# Patient Record
Sex: Female | Born: 1945
Health system: Southern US, Community
[De-identification: ages and names within clinical notes are randomized; demographics above are authoritative.]

## PROBLEM LIST (undated history)

## (undated) DIAGNOSIS — E669 Obesity, unspecified: Secondary | ICD-10-CM

## (undated) DIAGNOSIS — J302 Other seasonal allergic rhinitis: Secondary | ICD-10-CM

## (undated) DIAGNOSIS — E739 Lactose intolerance, unspecified: Secondary | ICD-10-CM

## (undated) DIAGNOSIS — L4050 Arthropathic psoriasis, unspecified: Secondary | ICD-10-CM

## (undated) DIAGNOSIS — I10 Essential (primary) hypertension: Secondary | ICD-10-CM

## (undated) DIAGNOSIS — O24419 Gestational diabetes mellitus in pregnancy, unspecified control: Secondary | ICD-10-CM

## (undated) DIAGNOSIS — M8580 Other specified disorders of bone density and structure, unspecified site: Secondary | ICD-10-CM

## (undated) DIAGNOSIS — I8390 Asymptomatic varicose veins of unspecified lower extremity: Secondary | ICD-10-CM

## (undated) DIAGNOSIS — Z87442 Personal history of urinary calculi: Secondary | ICD-10-CM

## (undated) DIAGNOSIS — K5790 Diverticulosis of intestine, part unspecified, without perforation or abscess without bleeding: Secondary | ICD-10-CM

## (undated) DIAGNOSIS — M2550 Pain in unspecified joint: Secondary | ICD-10-CM

## (undated) DIAGNOSIS — Z91018 Allergy to other foods: Secondary | ICD-10-CM

## (undated) DIAGNOSIS — K219 Gastro-esophageal reflux disease without esophagitis: Secondary | ICD-10-CM

## (undated) DIAGNOSIS — E119 Type 2 diabetes mellitus without complications: Secondary | ICD-10-CM

## (undated) DIAGNOSIS — I872 Venous insufficiency (chronic) (peripheral): Secondary | ICD-10-CM

## (undated) DIAGNOSIS — R600 Localized edema: Secondary | ICD-10-CM

## (undated) DIAGNOSIS — N200 Calculus of kidney: Secondary | ICD-10-CM

## (undated) DIAGNOSIS — R7989 Other specified abnormal findings of blood chemistry: Secondary | ICD-10-CM

## (undated) DIAGNOSIS — R928 Other abnormal and inconclusive findings on diagnostic imaging of breast: Secondary | ICD-10-CM

## (undated) DIAGNOSIS — M1990 Unspecified osteoarthritis, unspecified site: Secondary | ICD-10-CM

## (undated) DIAGNOSIS — R011 Cardiac murmur, unspecified: Secondary | ICD-10-CM

## (undated) HISTORY — DX: Type 2 diabetes mellitus without complications: E11.9

## (undated) HISTORY — DX: Cardiac murmur, unspecified: R01.1

## (undated) HISTORY — DX: Diverticulosis of intestine, part unspecified, without perforation or abscess without bleeding: K57.90

## (undated) HISTORY — DX: Asymptomatic varicose veins of unspecified lower extremity: I83.90

## (undated) HISTORY — DX: Other specified abnormal findings of blood chemistry: R79.89

## (undated) HISTORY — DX: Other abnormal and inconclusive findings on diagnostic imaging of breast: R92.8

## (undated) HISTORY — DX: Allergy to other foods: Z91.018

## (undated) HISTORY — DX: Obesity, unspecified: E66.9

## (undated) HISTORY — DX: Gestational diabetes mellitus in pregnancy, unspecified control: O24.419

## (undated) HISTORY — DX: Other specified disorders of bone density and structure, unspecified site: M85.80

## (undated) HISTORY — PX: TONSILLECTOMY AND ADENOIDECTOMY: SHX28

## (undated) HISTORY — DX: Essential (primary) hypertension: I10

## (undated) HISTORY — DX: Localized edema: R60.0

## (undated) HISTORY — PX: TONSILLECTOMY: SUR1361

## (undated) HISTORY — DX: Other seasonal allergic rhinitis: J30.2

## (undated) HISTORY — DX: Venous insufficiency (chronic) (peripheral): I87.2

## (undated) HISTORY — DX: Lactose intolerance, unspecified: E73.9

## (undated) HISTORY — DX: Arthropathic psoriasis, unspecified: L40.50

## (undated) HISTORY — DX: Calculus of kidney: N20.0

## (undated) HISTORY — DX: Pain in unspecified joint: M25.50

---

## 1995-10-28 DIAGNOSIS — N200 Calculus of kidney: Secondary | ICD-10-CM

## 1995-10-28 HISTORY — DX: Calculus of kidney: N20.0

## 2005-12-25 ENCOUNTER — Other Ambulatory Visit: Admission: RE | Admit: 2005-12-25 | Discharge: 2005-12-25 | Payer: Self-pay | Admitting: Family Medicine

## 2006-04-26 DIAGNOSIS — M8580 Other specified disorders of bone density and structure, unspecified site: Secondary | ICD-10-CM

## 2006-04-26 HISTORY — DX: Other specified disorders of bone density and structure, unspecified site: M85.80

## 2006-06-27 DIAGNOSIS — R928 Other abnormal and inconclusive findings on diagnostic imaging of breast: Secondary | ICD-10-CM

## 2006-06-27 HISTORY — PX: BREAST BIOPSY: SHX20

## 2006-06-27 HISTORY — DX: Other abnormal and inconclusive findings on diagnostic imaging of breast: R92.8

## 2006-07-10 ENCOUNTER — Ambulatory Visit: Payer: Self-pay | Admitting: Gastroenterology

## 2007-08-19 ENCOUNTER — Other Ambulatory Visit: Admission: RE | Admit: 2007-08-19 | Discharge: 2007-08-19 | Payer: Self-pay | Admitting: Family Medicine

## 2008-09-26 LAB — HM PAP SMEAR: HM Pap smear: NORMAL

## 2008-10-05 ENCOUNTER — Other Ambulatory Visit: Admission: RE | Admit: 2008-10-05 | Discharge: 2008-10-05 | Payer: Self-pay | Admitting: Family Medicine

## 2009-03-09 LAB — HM MAMMOGRAPHY: HM Mammogram: ABNORMAL

## 2009-03-09 LAB — HM DEXA SCAN

## 2011-03-14 NOTE — Assessment & Plan Note (Signed)
[facility]                           GASTROENTEROLOGY OFFICE NOTE   NAME:COUCHEvann, Koelzer                         MRN:          [id number]  DATE:07/10/2006                            DOB:          06-Dec-1945    Mrs. April Matthews is a very pleasant 65 year old white female referred for possible  screening colonoscopy because of her age.   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  She has really been in excellent  health all of her life except for some essential hypertension without any  complications or history of MIs or CVAs.  She has regular bowel movements  without melena or hematochezia or abdominal pain.  She does have occasional  postprandial acid reflux for which she takes antacids.  She follows a  regular diet and has had no anorexia or weight loss.  She has regular daily  bowel movements without melena or hematochezia.  She relates that in the  past she has not done Hemoccult cards to her knowledge.  She denies any  history of anemia or other chronic medical problems.   On close questioning, she did apparently have kidney stones some 10 years  ago.  She has had a previous tonsillectomy but no abdominal or pelvic  surgery.   FAMILY HISTORY:  Family history remarkable for atherosclerosis with CVAs in  her mother who was elderly but no known gastrointestinal problems.   SOCIAL HISTORY:  She is married and lives with her husband.  She does not  smoke and just uses ethanol very rarely on social occasions.   REVIEW OF SYSTEMS:  Otherwise noncontributory.  She no longer has menstrual  periods.   MEDICATIONS:  1. Atenolol 25 mg a day.  2. Ipratropium daily.  3. Aspirin 81 mg every other day.   EXAM:  GENERAL:  She is a healthy-appearing white female appearing her  stated age, in no acute distress.  VITAL SIGNS:  She is 5 feet 4-1/2 inches tall and weighs 196 pounds.  Blood  pressure 126/92.  Pulse 88 and regular.  I could not appreciate the  stigmata  of chronic liver disease or thyromegaly.  CHEST:  Clear.  There are no murmurs, gallops, or rubs noted.  HEART:  She appears to be in a regular rhythm.  ABDOMEN:  Abdominal exam showed no hepatosplenomegaly, masses, or  tenderness.  Bowel sounds are normal.  RECTAL EXAM:  Deferred.  EXTREMITIES:  Unremarkable.  MENTAL STATUS:  Normal.   ASSESSMENT:  1. Well-controlled essential hypertension.  2. Need for screening colonoscopy because of her age with no specific risk      factors otherwise.  3. History of kidney stones many years ago.   RECOMMENDATIONS:  Outpatient colonoscopy.   Outpatient colonoscopy is scheduled at her convenience.  The risks and  benefits of the procedure and alternative means of examining the colon have  been explained to her at length.  She has agreed to proceed as planned.  We  will hold her aspirin a week before procedure and restart it depending on  colonoscopy results and possible polypectomy.  She is to  continue her other  medications as per Dr. Delford Field excellent care.                                   [name] Rea. Jarold Motto, MD, [facility], [place]   DRP/MedQ  DD:  07/10/2006  DT:  07/11/2006  Job #:  [id number]   cc:   Lavonda Jumbo, M.D.

## 2011-05-15 ENCOUNTER — Encounter: Payer: Self-pay | Admitting: Podiatry

## 2011-05-20 ENCOUNTER — Telehealth: Payer: Self-pay | Admitting: Family Medicine

## 2011-05-20 MED ORDER — ATENOLOL 25 MG PO TABS: 25.0000 mg | ORAL_TABLET | Freq: Every day | ORAL | Status: DC

## 2011-05-20 NOTE — Telephone Encounter (Signed)
Sent in through computer per [employer]

## 2011-05-20 NOTE — Telephone Encounter (Signed)
Renew this medication

## 2011-06-19 ENCOUNTER — Ambulatory Visit (INDEPENDENT_AMBULATORY_CARE_PROVIDER_SITE_OTHER): Payer: BC Managed Care – PPO | Admitting: Family Medicine

## 2011-06-19 ENCOUNTER — Encounter: Payer: Self-pay | Admitting: Family Medicine

## 2011-06-19 VITALS — BP 132/90 | HR 72 | Ht 64.0 in | Wt 226.0 lb

## 2011-06-19 DIAGNOSIS — I10 Essential (primary) hypertension: Secondary | ICD-10-CM

## 2011-06-19 DIAGNOSIS — E559 Vitamin D deficiency, unspecified: Secondary | ICD-10-CM | POA: Insufficient documentation

## 2011-06-19 DIAGNOSIS — Z23 Encounter for immunization: Secondary | ICD-10-CM

## 2011-06-19 DIAGNOSIS — R7301 Impaired fasting glucose: Secondary | ICD-10-CM

## 2011-06-19 LAB — BASIC METABOLIC PANEL WITH GFR
BUN: 21 mg/dL (ref 6–23)
CO2: 30 meq/L (ref 19–32)
Calcium: 9.7 mg/dL (ref 8.4–10.5)
Chloride: 104 meq/L (ref 96–112)
Creat: 1.07 mg/dL (ref 0.50–1.10)
Glucose, Bld: 110 mg/dL — ABNORMAL HIGH (ref 70–99)
Potassium: 4.2 meq/L (ref 3.5–5.3)
Sodium: 142 meq/L (ref 135–145)

## 2011-06-19 LAB — HEMOGLOBIN A1C
Hgb A1c MFr Bld: 6.5 % — ABNORMAL HIGH
Mean Plasma Glucose: 140 mg/dL — ABNORMAL HIGH

## 2011-06-19 MED ORDER — EPINEPHRINE 0.3 MG/0.3ML IJ DEVI: 0.3000 mg | Freq: Once | INTRAMUSCULAR | Status: DC

## 2011-06-19 MED ORDER — ATENOLOL 25 MG PO TABS: 25.0000 mg | ORAL_TABLET | Freq: Every day | ORAL | Status: DC

## 2011-06-19 NOTE — Progress Notes (Signed)
Patient presents to re-establish care, requesting TdaP, and refill of medications, including epi-pen.  She was not seen since she last saw me at [facility] over a year ago. She denies any complaints.  F/u HTN: BP's at [facility] and at home usually run 128-133/72.  Denies headaches, dizziness.  She regained the weight she lost last year through the healthy living challenge.  Chart reviewed with patient--it appears that she never followed up on the abnormal mammogram and ultrasound of L breast--it was recommended that she have a biopsy. She never followed up for biopsy, nor has she had mammogram since.  She has +family history of breast cancer  Past Medical History  Diagnosis Date  . DM (diabetes mellitus), gestational     with first pregnancy (stillborn)--based on autopsy; not diagnosed during pregnancy  . Seasonal allergies   . Heart murmur   . HTN (hypertension)   . Venous insufficiency   . Varicose veins   . Kidney stone 1997  . Abnormal mammogram 9/07    Right breast--bx sclerosing papilloma  . Osteopenia 7/07    mild  . Vitamin D deficiency 12/09  . Abnormal mammogram 02/2009    Left; suspicious abnormality L brast mammo and u/s.  Biopsy recommended but patient never did nor f/u mammo    Past Surgical History  Procedure Date  . Cesarean section 1977  . Tonsillectomy and adenoidectomy   . Breast biopsy 9/07    right--sclerosing papilloma    History   Social History  . Marital Status: Married    Spouse Name: N/A    Number of Children: N/A  . Years of Education: N/A   Occupational History  . reading specialist, works part-time    Social History Main Topics  . Smoking status: Never Smoker   . Smokeless tobacco: Never Used  . Alcohol Use: Yes     occasional wine  . Drug Use: No  . Sexually Active:    Other Topics Concern  . Not on file   Social History Narrative   Lives with husband and 2 dogs    Family History  Problem Relation Age of Onset  . Stroke Mother    . Hypertension Mother   . GER disease Father   . Heart disease Brother   . Cancer Maternal Aunt     breast cancer late 24's  . Cancer Maternal Grandmother     breast cancer 60's  . Cancer Brother     prostate    Current outpatient prescriptions:atenolol (TENORMIN) 25 MG tablet, Take 1 tablet (25 mg total) by mouth daily., Disp: 90 tablet, Rfl: 3;  Calcium Citrate-Vitamin D (CALCIUM CITRATE + D PO), Take by mouth 2 (two) times daily. 1 tablespoon twice daily , Disp: , Rfl: ;  ipratropium (ATROVENT) 0.03 % nasal spray, Place 2 sprays into the nose every 12 (twelve) hours.  , Disp: , Rfl:  Multiple Vitamins-Minerals (CENTRUM SILVER PO), Take 1 tablet by mouth daily.  , Disp: , Rfl: ;  DISCONTD: atenolol (TENORMIN) 25 MG tablet, Take 1 tablet (25 mg total) by mouth daily., Disp: 30 tablet, Rfl: 0;  EPINEPHrine (EPIPEN) 0.3 mg/0.3 mL DEVI, Inject 0.3 mLs (0.3 mg total) into the muscle once., Disp: 1 Device, Rfl: 1  Allergies  Allergen Reactions  . Sesame Oil Anaphylaxis    Sesame seed/oil  . Latex Rash   ROS: Denies headaches, fatigue, dizziness, edema.  No chest pain, palpitations, SOB Denies cough, URI symptoms or allergies.  Denies GI complaints (had  epigastric pains last month, but resolved). Denies depression, skin concerns or other complaints  PHYSICAL EXAM:  Well developed, well nourished patient, in no distress BP 132/90  Pulse 72  Ht 5\' 4"  (1.626 m)  Wt 226 lb (102.513 kg)  BMI 38.79 kg/m2 Neck: No lymphadenopathy or thyromegaly, no carotid bruit Heart:  Regular rate and rhythm, no murmurs, rubs, gallops or ectopy Lungs:  Clear bilaterally, without wheezes, rales or ronchi Abdomen:  Soft, nontender, nondistended, no hepatosplenomegaly or masses, normal bowel sounds Extremities:  No clubbing, cyanosis or edema, 2+ pulses.  Neuro:  Alert and oriented x 3, cranial nerves grossly intact.  DTR's 2+ and symmetric.  Normal strength and sensation Back:  No spine or CVA  tenderness Skin: no rashes or suspicious lesions Psych:  Normal mood, affect, hygiene and grooming, normal speech, eye contact  ASSESSMENT/PLAN: 1. Essential hypertension, benign  Basic metabolic panel, atenolol (TENORMIN) 25 MG tablet  2. Need for Tdap vaccination  Tdap vaccine greater than or equal to 7yo IM  3. Impaired fasting glucose  Hemoglobin A1c  4. Unspecified vitamin D deficiency  Vitamin D 25 hydroxy   Daily exercise, smart food choices and weight loss encouraged.  Continue monitoring BP elsewhere. [facility] chart was more extensively reviewed after her visit--only sent records for last 3 years, no immunization history (just that she had a tetanus in '07). Epi-pen was rx'd for food and bee sting allergy  She will need to come in for flu shot and pneumovax (no record she ever had one) at her convenience, this fall (can do at CPE).

## 2011-06-19 NOTE — Patient Instructions (Signed)
PLEASE FOLLOW UP ON GETTING YOUR MAMMOGRAM DONE! (there was a suspicious abnormality in L breast in 02/2009)

## 2011-06-23 ENCOUNTER — Telehealth: Payer: Self-pay | Admitting: *Deleted

## 2011-06-23 NOTE — Telephone Encounter (Signed)
Left message for patient to return my call for lab results. 

## 2011-07-09 ENCOUNTER — Encounter: Payer: Self-pay | Admitting: *Deleted

## 2011-07-16 ENCOUNTER — Encounter: Payer: Self-pay | Admitting: Family Medicine

## 2011-07-17 ENCOUNTER — Telehealth: Payer: Self-pay | Admitting: *Deleted

## 2011-07-17 NOTE — Telephone Encounter (Signed)
Patient called to let Dr.Knapp know that she has started "[employer]" and is really enjoying it. She would like the nutrition consult that Dr.Knapp offered. I have sent a referral to [facility].

## 2011-11-12 ENCOUNTER — Encounter: Payer: Self-pay | Admitting: Family Medicine

## 2011-11-12 ENCOUNTER — Ambulatory Visit (INDEPENDENT_AMBULATORY_CARE_PROVIDER_SITE_OTHER): Payer: BC Managed Care – PPO | Admitting: Family Medicine

## 2011-11-12 DIAGNOSIS — M2550 Pain in unspecified joint: Secondary | ICD-10-CM | POA: Diagnosis not present

## 2011-11-12 DIAGNOSIS — E1169 Type 2 diabetes mellitus with other specified complication: Secondary | ICD-10-CM | POA: Insufficient documentation

## 2011-11-12 DIAGNOSIS — E119 Type 2 diabetes mellitus without complications: Secondary | ICD-10-CM

## 2011-11-12 DIAGNOSIS — L408 Other psoriasis: Secondary | ICD-10-CM

## 2011-11-12 DIAGNOSIS — L409 Psoriasis, unspecified: Secondary | ICD-10-CM

## 2011-11-12 MED ORDER — NAPROXEN 500 MG PO TABS: 500.0000 mg | ORAL_TABLET | Freq: Two times a day (BID) | ORAL | Status: DC

## 2011-11-12 NOTE — Progress Notes (Signed)
Chief complaint:  joint pain feet and hands since September. Also about the same time that started she had a skin breakout and saw her dermatologist and will be following up in 2 weeks with her  HPI: multiple joint pains, requesting referral to rheumatologist.  Started working out at [facility], lost 16 pounds.  Started getting R medial ankle pain the week she started exercising.  Has continued to be painful since September.  Then began (in October) with finger pain, R index finger, starting at distal phalynx, and pain and swelling then spread proximally.  She reports it was "swollen like a sausage".  Has been using 200mg  of Advil twice daily since October.  2 weeks ago noticed black and blue bruise and swelling at R 3rd knuckle and into hand.  Now is also having pain at her 5th MCP's bilaterally.  Still has limited ROM of R index finger.  Last night had sharp pain at her R clavicle.  Started as a sharp pain, but now is just achey.  R ankle continues to hurt, hurts to walk on it.  Pain comes and goes, since September.  Some days she hardly notices it, and other days pain is sharp (although dulled by the Advil), but noticeable throughout the day.  Patient was diagnosed with psoriasis 12/28, when she saw Dr. Swaziland.  Rash had started in early September, shortly after visit here, around the same time as her joint pains began.  She has a good friend with RA, and sees Dr. Dierdre Forth at [facility] (rheumatologist). She called, but was told she needed a referral by PCP to be seen.  Concerned about possible psoriatic arthritis.  She was diagnosed with DM at her last visit (A1c 6.5).  She has been exercising, losing weight, and is due for recheck of her A1c.  She had called regarding nutrition education, but when she found out it was an evening group session, she declined appointment.  Past Medical History  Diagnosis Date  . DM (diabetes mellitus), gestational     with first pregnancy (stillborn)--based on  autopsy; not diagnosed during pregnancy  . Seasonal allergies   . Heart murmur   . HTN (hypertension)   . Venous insufficiency   . Varicose veins   . Kidney stone 1997  . Abnormal mammogram 9/07    Right breast--bx sclerosing papilloma  . Osteopenia 7/07    mild  . Vitamin D deficiency 12/09  . Abnormal mammogram 02/2009    Left; suspicious abnormality L breast mammo and u/s.  Biopsy recommended but patient never did nor f/u mammo    Past Surgical History  Procedure Date  . Cesarean section 1977  . Tonsillectomy and adenoidectomy   . Breast biopsy 9/07    right--sclerosing papilloma    History   Social History  . Marital Status: Married    Spouse Name: N/A    Number of Children: N/A  . Years of Education: N/A   Occupational History  . reading specialist, works part-time    Social History Main Topics  . Smoking status: Never Smoker   . Smokeless tobacco: Never Used  . Alcohol Use: Yes     occasional wine  . Drug Use: No  . Sexually Active:    Other Topics Concern  . Not on file   Social History Narrative   Lives with husband and 2 dogs    Family History  Problem Relation Age of Onset  . Stroke Mother   . Hypertension Mother   .  Cancer Mother     SKIN CANCER  . GER disease Father   . Heart disease Brother   . Cancer Maternal Aunt     breast cancer late 24's  . Cancer Maternal Grandmother     breast cancer 60's  . Cancer Brother     prostate   Current Outpatient Prescriptions on File Prior to Visit  Medication Sig Dispense Refill  . atenolol (TENORMIN) 25 MG tablet Take 1 tablet (25 mg total) by mouth daily.  90 tablet  3  . Calcium Citrate-Vitamin D (CALCIUM CITRATE + D PO) Take by mouth 2 (two) times daily. 1 tablespoon twice daily       . EPINEPHrine (EPIPEN) 0.3 mg/0.3 mL DEVI Inject 0.3 mLs (0.3 mg total) into the muscle once.  1 Device  1  . ipratropium (ATROVENT) 0.03 % nasal spray Place 2 sprays into the nose every 12 (twelve) hours.        .  Multiple Vitamins-Minerals (CENTRUM SILVER PO) Take 1 tablet by mouth daily.          Allergies  Allergen Reactions  . Sesame Oil Anaphylaxis    Sesame seed/oil  . Latex Rash   ROS: Denies fevers, URI symptoms, chest pain, shortness of breath, GI complaints. +ongoing rash. +joint pains  PHYSICAL EXAM: BP 126/78  Pulse 72  Ht 5' 3.25" (1.607 m)  Wt 212 lb (96.163 kg)  BMI 37.26 kg/m2 Well developed, pleasant obese female in no distress  Back--extensive small plaques on her upper and lower back, consistent with psoriasis L clavicle--area of discomfort is mid clavicular.  Not tender to palpation.  No warmth or effusion.  Nontender at [facility] and [place] joints Hands: R index finger with STS at proximal and middle phalanx.  Distal phalanx isn't swollen.  Some tenderness at lateral aspect of bilateral 5th MCP joints.  No warmth, swelling, no pain with movement. Tender at R medial malleolus. No pain with ROM, no pain with strength testing--strength intact. Some tenderness at L 5th toe, mild swelling Abdomen:  Nontender, no mass Heart: regular rate and rhythm Lungs: clear bilaterally  ASSESSMENT/PLAN: 1. Type II or unspecified type diabetes mellitus without mention of complication, not stated as uncontrolled  Hemoglobin A1c  2. Joint pain  CBC with Differential, Sed Rate (ESR), Rheumatoid Factor, Antinuclear Antib (ANA), Ambulatory referral to Rheumatology, naproxen (NAPROSYN) 500 MG tablet  3. Psoriasis     Arthritis, joint pains--possibly psoriatic arthritis, given recent h/o psoriasis. Cbc, ESR, ANA, RF.  Send copies to Dr. Dierdre Forth  DM--check A1c today.  If still 6.5 or higher, consider metformin, and re-consider dietician  Psoriasis (scalp, back)--f/u as planned with Dr. Swaziland  F/u abnormal mammogram (from 2010!)--we again reviewed her previous abnormal exam, and again encouraged that she schedule f/u

## 2011-11-12 NOTE — Patient Instructions (Signed)
Use naprosyn as directed.  Stop the Advil.  We will send copies of your lab results to Dr. Dierdre Forth, along with referral. Stop the naprosyn if causing significant stomach pain

## 2011-11-13 ENCOUNTER — Other Ambulatory Visit: Payer: Self-pay | Admitting: *Deleted

## 2011-11-13 DIAGNOSIS — E119 Type 2 diabetes mellitus without complications: Secondary | ICD-10-CM

## 2011-11-13 LAB — CBC WITH DIFFERENTIAL/PLATELET
Basophils Absolute: 0.1 10*3/uL (ref 0.0–0.1)
Basophils Relative: 1 % (ref 0–1)
Hemoglobin: 13.8 g/dL (ref 12.0–15.0)
MCHC: 32.3 g/dL (ref 30.0–36.0)
Monocytes Relative: 8 % (ref 3–12)
Neutro Abs: 6.2 10*3/uL (ref 1.7–7.7)
Neutrophils Relative %: 66 % (ref 43–77)
RBC: 4.33 MIL/uL (ref 3.87–5.11)
WBC: 9.4 10*3/uL (ref 4.0–10.5)

## 2011-11-13 LAB — ANTI-NUCLEAR AB-TITER (ANA TITER)

## 2011-11-13 LAB — SEDIMENTATION RATE: Sed Rate: 73 mm/hr — ABNORMAL HIGH (ref 0–22)

## 2011-11-13 LAB — HEMOGLOBIN A1C
Hgb A1c MFr Bld: 6.8 % — ABNORMAL HIGH (ref <5.7)
Mean Plasma Glucose: 148 mg/dL — ABNORMAL HIGH (ref <117)

## 2011-11-13 LAB — RHEUMATOID FACTOR: Rhuematoid fact SerPl-aCnc: <10 IU/mL (ref <=14)

## 2011-11-13 MED ORDER — METFORMIN HCL ER 500 MG PO TB24: 500.0000 mg | ORAL_TABLET | Freq: Every day | ORAL | Status: DC

## 2011-11-17 ENCOUNTER — Telehealth: Payer: Self-pay | Admitting: *Deleted

## 2011-11-17 ENCOUNTER — Other Ambulatory Visit: Payer: Self-pay | Admitting: *Deleted

## 2011-11-17 MED ORDER — METFORMIN HCL 500 MG PO TABS: 500.0000 mg | ORAL_TABLET | Freq: Every day | ORAL | Status: DC

## 2011-11-17 NOTE — Telephone Encounter (Signed)
Spoke with pharmacist and yes, patient had it backwards. I went ahead and explained to patient and called in the regular metformin 500 mg #30 and she will call and let Dr.Knapp know if there are any further problems.

## 2011-11-17 NOTE — Telephone Encounter (Signed)
According to computer, she was rx'd the XR.  Is she asking to change to the regular metformin?  Please clarify with pharmacist.  I prefer the XR, but if she is unable to swallow it, okay to change to regular 500mg  once daily.  If sugars aren't improving, may need to increase to BID dosing (to be discussed at f/u visits)

## 2011-11-17 NOTE — Telephone Encounter (Signed)
Patient called and stated that the metformin that you gave her a huge pills that she will never be able to get down. The pharmacist showed her the XR pills and they were small pills that patient felt she could swallow. Is this change ok? Please advise. Thanks.

## 2011-11-20 ENCOUNTER — Telehealth: Payer: Self-pay | Admitting: *Deleted

## 2011-11-20 NOTE — Telephone Encounter (Signed)
Called patient to let her know that she has an appt with Dr.Beekman December 10, 2011 @ 2pm. [address] [phone].

## 2011-11-27 ENCOUNTER — Telehealth: Payer: Self-pay | Admitting: *Deleted

## 2011-11-27 DIAGNOSIS — M2550 Pain in unspecified joint: Secondary | ICD-10-CM

## 2011-11-27 MED ORDER — NAPROXEN 500 MG PO TABS: 500.0000 mg | ORAL_TABLET | Freq: Two times a day (BID) | ORAL | Status: DC

## 2011-11-27 NOTE — Telephone Encounter (Signed)
Called patient to let her know that rx was sent to [facility] for Naprosyn 500mg  #60 BID with 0 refills.

## 2011-11-27 NOTE — Telephone Encounter (Signed)
Patient called and stated that you had given her 15 days worth of Naprosyn. Her appt with Dr.Beekman is not until 12/10/11, would you be able to call her in enough to last her until her appt with Dr.Beekman as it is working real well and she has not experienced any side effects. Also she is scheduled with [facility] for nutrition consult on 12/16/11. Thanks.

## 2011-12-03 DIAGNOSIS — L408 Other psoriasis: Secondary | ICD-10-CM | POA: Diagnosis not present

## 2011-12-10 DIAGNOSIS — L4050 Arthropathic psoriasis, unspecified: Secondary | ICD-10-CM | POA: Diagnosis not present

## 2011-12-16 DIAGNOSIS — I10 Essential (primary) hypertension: Secondary | ICD-10-CM | POA: Diagnosis not present

## 2011-12-16 DIAGNOSIS — R011 Cardiac murmur, unspecified: Secondary | ICD-10-CM | POA: Diagnosis not present

## 2011-12-16 DIAGNOSIS — E119 Type 2 diabetes mellitus without complications: Secondary | ICD-10-CM | POA: Diagnosis not present

## 2011-12-16 DIAGNOSIS — Z713 Dietary counseling and surveillance: Secondary | ICD-10-CM | POA: Diagnosis not present

## 2011-12-25 ENCOUNTER — Telehealth: Payer: Self-pay | Admitting: Internal Medicine

## 2011-12-25 DIAGNOSIS — M2550 Pain in unspecified joint: Secondary | ICD-10-CM

## 2011-12-25 MED ORDER — NAPROXEN 500 MG PO TABS: 500.0000 mg | ORAL_TABLET | Freq: Two times a day (BID) | ORAL | Status: DC

## 2011-12-25 NOTE — Telephone Encounter (Signed)
Rx was sent to pharmacy.  Did she ever see Dr. Dierdre Forth early in February? If so, please get notes.  I don't necessarily want her on NSAIDS daily longterm.

## 2011-12-29 ENCOUNTER — Telehealth: Payer: Self-pay | Admitting: *Deleted

## 2011-12-29 NOTE — Telephone Encounter (Signed)
Left message for patient letting her know that rx was called into pharmacy and asking her to call me back and let me know if she saw Dr.Beekman in early Feb so I can get notes from that visit.

## 2012-01-06 ENCOUNTER — Telehealth: Payer: Self-pay | Admitting: Family Medicine

## 2012-01-06 NOTE — Telephone Encounter (Signed)
I'm not sure what to do with this message. Is she requesting diabetic supplies?  Type? Okay for 90 day supply with 3 refills of whatever supplies she needs

## 2012-01-26 ENCOUNTER — Telehealth: Payer: Self-pay | Admitting: Internal Medicine

## 2012-01-26 NOTE — Telephone Encounter (Signed)
Spoke with patient and let her know that Dr.Knapp stated that she will need to get this rx from Dr.Beekman. Patient verbalized understanding.

## 2012-01-26 NOTE — Telephone Encounter (Signed)
Deny--she should be getting this from Dr. Dierdre Forth.  She was referred to him for treatment of her psoriatic arthritis.  He started her on Methotrexate. He should be the one rx'ing meds for this condition now (per his notes, this med was only minimally effective for her pain).

## 2012-01-27 DIAGNOSIS — R011 Cardiac murmur, unspecified: Secondary | ICD-10-CM | POA: Diagnosis not present

## 2012-01-27 DIAGNOSIS — Z713 Dietary counseling and surveillance: Secondary | ICD-10-CM | POA: Diagnosis not present

## 2012-01-27 DIAGNOSIS — E119 Type 2 diabetes mellitus without complications: Secondary | ICD-10-CM | POA: Diagnosis not present

## 2012-01-27 DIAGNOSIS — I10 Essential (primary) hypertension: Secondary | ICD-10-CM | POA: Diagnosis not present

## 2012-02-10 DIAGNOSIS — M25549 Pain in joints of unspecified hand: Secondary | ICD-10-CM | POA: Diagnosis not present

## 2012-02-10 DIAGNOSIS — L4050 Arthropathic psoriasis, unspecified: Secondary | ICD-10-CM | POA: Diagnosis not present

## 2012-02-16 ENCOUNTER — Telehealth: Payer: Self-pay | Admitting: Internal Medicine

## 2012-02-16 MED ORDER — METFORMIN HCL 500 MG PO TABS: 500.0000 mg | ORAL_TABLET | Freq: Every day | ORAL | Status: DC

## 2012-02-16 NOTE — Telephone Encounter (Signed)
I SENT THE RX REFILL FOR METFORMIN 500 MG TO THE PATIENTS PHARMACY. CLS

## 2012-03-10 DIAGNOSIS — L259 Unspecified contact dermatitis, unspecified cause: Secondary | ICD-10-CM | POA: Diagnosis not present

## 2012-03-10 DIAGNOSIS — I8310 Varicose veins of unspecified lower extremity with inflammation: Secondary | ICD-10-CM | POA: Diagnosis not present

## 2012-03-10 DIAGNOSIS — L408 Other psoriasis: Secondary | ICD-10-CM | POA: Diagnosis not present

## 2012-04-13 DIAGNOSIS — L4050 Arthropathic psoriasis, unspecified: Secondary | ICD-10-CM | POA: Diagnosis not present

## 2012-05-14 ENCOUNTER — Telehealth: Payer: Self-pay | Admitting: Internal Medicine

## 2012-05-14 MED ORDER — METFORMIN HCL 500 MG PO TABS: 500.0000 mg | ORAL_TABLET | Freq: Every day | ORAL | Status: DC

## 2012-05-14 NOTE — Telephone Encounter (Signed)
I sent the refill request to the pharmacy for the patient. CLS

## 2012-05-31 DIAGNOSIS — L408 Other psoriasis: Secondary | ICD-10-CM | POA: Diagnosis not present

## 2012-05-31 DIAGNOSIS — D239 Other benign neoplasm of skin, unspecified: Secondary | ICD-10-CM | POA: Diagnosis not present

## 2012-05-31 DIAGNOSIS — L821 Other seborrheic keratosis: Secondary | ICD-10-CM | POA: Diagnosis not present

## 2012-06-14 ENCOUNTER — Telehealth: Payer: Self-pay | Admitting: Internal Medicine

## 2012-06-14 DIAGNOSIS — I10 Essential (primary) hypertension: Secondary | ICD-10-CM

## 2012-06-14 DIAGNOSIS — L4050 Arthropathic psoriasis, unspecified: Secondary | ICD-10-CM | POA: Diagnosis not present

## 2012-06-14 MED ORDER — ATENOLOL 25 MG PO TABS: 25.0000 mg | ORAL_TABLET | Freq: Every day | ORAL | Status: DC

## 2012-06-14 NOTE — Telephone Encounter (Signed)
Done

## 2012-06-21 ENCOUNTER — Telehealth: Payer: Self-pay | Admitting: Internal Medicine

## 2012-06-21 MED ORDER — METFORMIN HCL 500 MG PO TABS: 500.0000 mg | ORAL_TABLET | Freq: Every day | ORAL | Status: DC

## 2012-06-21 NOTE — Telephone Encounter (Signed)
Done

## 2012-08-05 ENCOUNTER — Telehealth: Payer: Self-pay | Admitting: Internal Medicine

## 2012-08-05 DIAGNOSIS — E119 Type 2 diabetes mellitus without complications: Secondary | ICD-10-CM

## 2012-08-05 MED ORDER — METFORMIN HCL 500 MG PO TABS: 500.0000 mg | ORAL_TABLET | Freq: Every day | ORAL | Status: DC

## 2012-08-05 NOTE — Telephone Encounter (Signed)
Refilled patient's metformin for only 30 days worth, left a detailed message letting her know that she needs to call our office and schedule a follow up in the next 30 days as she is overdue.

## 2012-08-11 ENCOUNTER — Telehealth: Payer: Self-pay | Admitting: Family Medicine

## 2012-08-11 DIAGNOSIS — E119 Type 2 diabetes mellitus without complications: Secondary | ICD-10-CM

## 2012-08-11 DIAGNOSIS — I10 Essential (primary) hypertension: Secondary | ICD-10-CM

## 2012-08-11 MED ORDER — ATENOLOL 25 MG PO TABS: 25.0000 mg | ORAL_TABLET | Freq: Every day | ORAL | Status: DC

## 2012-08-11 MED ORDER — METFORMIN HCL 500 MG PO TABS: 500.0000 mg | ORAL_TABLET | Freq: Every day | ORAL | Status: DC

## 2012-08-11 NOTE — Telephone Encounter (Signed)
Done

## 2012-09-07 ENCOUNTER — Telehealth: Payer: Self-pay | Admitting: Family Medicine

## 2012-09-07 NOTE — Telephone Encounter (Signed)
PT HASN'T HAD CPE HERE, FORM DOES ASK FOR VISION, HEARING, HEART, LUNGS & COMMUNICABLE DISEASE INFO BUT NOT SPECIFICALLY CPE.  DO YOU WANT ME TO SCHEDULE PT FOR CONSULT APPT FOR FORM COMPLETION (30 MIN) FOR TOMORROW

## 2012-09-07 NOTE — Telephone Encounter (Signed)
Pt coming in tomorrow for form visit and flu shot

## 2012-09-07 NOTE — Telephone Encounter (Signed)
She is long past due for follow-up.  She was started on Metformin in January, and hasn't been seen since (should be every 6 months), so she is at least due for a med check  If hasn't had a CPE, needs that also (offer to schedule).  I see she has appt scheduled for December for a med check.  Form can't be filled out without at least a visit.  Did she say when she needed form by? Could we do at her December visit? (It is 12/4, I believe).  If not, then just put her in for a 15 min slot for form completion, as I will NOT be doing the med check tomorrow (need to have acute slots available in afternoon)--she needs to keep December appt for med check

## 2012-09-08 ENCOUNTER — Other Ambulatory Visit: Payer: BC Managed Care – PPO

## 2012-09-08 ENCOUNTER — Ambulatory Visit (INDEPENDENT_AMBULATORY_CARE_PROVIDER_SITE_OTHER): Payer: Medicare Other | Admitting: Family Medicine

## 2012-09-08 ENCOUNTER — Encounter: Payer: Self-pay | Admitting: Family Medicine

## 2012-09-08 VITALS — BP 102/76 | HR 60 | Ht 63.5 in | Wt 209.0 lb

## 2012-09-08 DIAGNOSIS — E119 Type 2 diabetes mellitus without complications: Secondary | ICD-10-CM | POA: Diagnosis not present

## 2012-09-08 DIAGNOSIS — Z23 Encounter for immunization: Secondary | ICD-10-CM | POA: Diagnosis not present

## 2012-09-08 DIAGNOSIS — I10 Essential (primary) hypertension: Secondary | ICD-10-CM | POA: Diagnosis not present

## 2012-09-08 DIAGNOSIS — Z111 Encounter for screening for respiratory tuberculosis: Secondary | ICD-10-CM | POA: Diagnosis not present

## 2012-09-08 MED ORDER — ATENOLOL 25 MG PO TABS: 25.0000 mg | ORAL_TABLET | Freq: Every day | ORAL | Status: DC

## 2012-09-08 NOTE — Patient Instructions (Addendum)
Check your blood pressure elsewhere and bring list with you to your next appointment.

## 2012-09-08 NOTE — Progress Notes (Signed)
Chief Complaint  Patient presents with  . Form    needs form filled out, appt per Dr.Lott Seelbach.   Needs refill of atenolol. Denies headaches or dizziness. Denies back pain, chest pain, shortness of breath.  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  Denies back pain, cough, vision/hearing problems or other concerns.  Normal vision/hearing test done by nurse today.  PHYSICAL EXAM:  BP 102/76  Pulse 60  Ht 5' 3.5" (1.613 m)  Wt 209 lb (94.802 kg)  BMI 36.44 kg/m2 Well developed pleasant overweight female in no distress Heart: regular rate and rhythm, 2/6 SEM RUSB Lungs: clear bilaterally Extremities: no edema Back: no spine or CVA tenderness  ASSESSMENT/PLAN:  1. Essential hypertension, benign  atenolol (TENORMIN) 25 MG tablet  2. Need for prophylactic vaccination and inoculation against influenza  Flu vaccine greater than or equal to 3yo preservative free IM  3. Screening examination for pulmonary tuberculosis  TB Skin Test  4. Type II or unspecified type diabetes mellitus without mention of complication, not stated as uncontrolled  Microalbumin / creatinine urine ratio   Discussed BP, and ACEI (as recommended for those with DM and HTN).  Given that BP is low today, recommended that she check BP elsewhere and bring list of BP's to next visit.  If consistently <110, consider tapering off and monitoring without meds.  If negative urine microalbumin, then can consider being off meds.  If +microalbuminuria, then change to ACEI. If BP's are >110, change to ACEI.  F/u at med check as scheduled in December.

## 2012-09-09 LAB — MICROALBUMIN / CREATININE URINE RATIO
Creatinine, Urine: 153.8 mg/dL
Microalb Creat Ratio: 3.6 mg/g (ref 0.0–30.0)

## 2012-09-10 NOTE — Addendum Note (Signed)
Addended by: Lavell Islam on: 09/10/2012 03:30 PM   Modules accepted: Orders

## 2012-09-15 ENCOUNTER — Telehealth: Payer: Self-pay | Admitting: Internal Medicine

## 2012-09-15 DIAGNOSIS — E119 Type 2 diabetes mellitus without complications: Secondary | ICD-10-CM

## 2012-09-15 MED ORDER — METFORMIN HCL 500 MG PO TABS: 500.0000 mg | ORAL_TABLET | Freq: Every day | ORAL | Status: DC

## 2012-09-15 NOTE — Telephone Encounter (Signed)
Done

## 2012-09-17 ENCOUNTER — Encounter: Payer: Self-pay | Admitting: Internal Medicine

## 2012-09-29 ENCOUNTER — Ambulatory Visit (INDEPENDENT_AMBULATORY_CARE_PROVIDER_SITE_OTHER): Payer: Medicare Other | Admitting: Family Medicine

## 2012-09-29 ENCOUNTER — Encounter: Payer: Self-pay | Admitting: Family Medicine

## 2012-09-29 VITALS — BP 140/73 | HR 60 | Ht 63.5 in | Wt 210.0 lb

## 2012-09-29 DIAGNOSIS — I10 Essential (primary) hypertension: Secondary | ICD-10-CM

## 2012-09-29 DIAGNOSIS — L4050 Arthropathic psoriasis, unspecified: Secondary | ICD-10-CM | POA: Diagnosis not present

## 2012-09-29 DIAGNOSIS — E119 Type 2 diabetes mellitus without complications: Secondary | ICD-10-CM | POA: Diagnosis not present

## 2012-09-29 MED ORDER — LISINOPRIL 10 MG PO TABS: 10.0000 mg | ORAL_TABLET | Freq: Every day | ORAL | Status: DC

## 2012-09-29 MED ORDER — METFORMIN HCL 500 MG PO TABS: 500.0000 mg | ORAL_TABLET | Freq: Every day | ORAL | Status: DC

## 2012-09-29 NOTE — Patient Instructions (Signed)
Start lisinopril once daily.  Call for change if you develop a severe cough.  Cut in 1/2 if BP running <110/60.  Otherwise, continue to take regularly, and return in 3-4 weeks, at which time we will do a blood test to check kidney test and potassium.

## 2012-09-29 NOTE — Progress Notes (Signed)
Chief Complaint  Patient presents with  . Diabetes    follow up on diabetes. Needs mammogram, would like to go to [facility]-did not really care for [facility].   HTN--has been on atenolol since the 1990's--thought it was originally started for irregular heartbeat and tachycardia, not for high blood pressure.  Hasn't had any of these symptoms recently.  She has monitored her BP a few times since her last visit, and it is running 145/73.  Denies headaches, chest pain, palpitations, edema.  She had some edema at the end of the day when she stopped the methotrexate, but it has resolved.  Diabetes follow-up:  Blood sugars at home are running 100-123.  Denies hypoglycemia.  Denies polydipsia and polyuria.  Last eye exam was 09/2011, and is scheduled for this month.  Patient follows a low sugar diet and checks feet regularly without concerns.  Wears a pedometer, and is getting 7500-10000 steps daily.  She lost weight over the summer when doing water aerobics.  She has a recumbant bike.  She saw nutritionist at [facility], which was helpful, and she is taking metformin daily without side effects.  Has slight PND related to allergies, as she doesn't use the nasal steroid on a regular basis.  Complaining of scratchy throat and intermittent cough.  Psoriatic arthritis--Dr. Dierdre Forth.  Was on methotrexate x 6 months, off since October.  Creams seem to be controlling skin lesions.  Denies joint pains.  Past Medical History  Diagnosis Date  . DM (diabetes mellitus), gestational     with first pregnancy (stillborn)--based on autopsy; not diagnosed during pregnancy  . Seasonal allergies   . Heart murmur   . HTN (hypertension)   . Venous insufficiency   . Varicose veins   . Kidney stone 1997  . Abnormal mammogram 9/07    Right breast--bx sclerosing papilloma  . Osteopenia 7/07    mild  . Vitamin D deficiency 12/09  . Abnormal mammogram 02/2009    Left; suspicious abnormality L breast mammo and u/s.  Biopsy  recommended but patient never did nor f/u mammo   Past Surgical History  Procedure Date  . Cesarean section 1977  . Tonsillectomy and adenoidectomy   . Breast biopsy 9/07    right--sclerosing papilloma   History   Social History  . Marital Status: Married    Spouse Name: N/A    Number of Children: N/A  . Years of Education: N/A   Occupational History  . reading specialist, works part-time    Social History Main Topics  . Smoking status: Never Smoker   . Smokeless tobacco: Never Used  . Alcohol Use: Yes     Comment: occasional wine  . Drug Use: No  . Sexually Active:    Other Topics Concern  . Not on file   Social History Narrative   Lives with husband and 2 dogs   Current Outpatient Prescriptions on File Prior to Visit  Medication Sig Dispense Refill  . atenolol (TENORMIN) 25 MG tablet Take 1 tablet (25 mg total) by mouth daily.  90 tablet  0  . Calcium Citrate-Vitamin D (CALCIUM CITRATE + D PO) Take by mouth 2 (two) times daily. 1 tablespoon twice daily       . ibuprofen (ADVIL) 200 MG tablet Take 200 mg by mouth 2 (two) times daily.      . Multiple Vitamins-Minerals (CENTRUM SILVER PO) Take 1 tablet by mouth daily.        . [DISCONTINUED] metFORMIN (GLUCOPHAGE)  500 MG tablet Take 1 tablet (500 mg total) by mouth daily with breakfast. Patient needs to schedule an appointment to follow on diabetes.  30 tablet  1  . EPINEPHrine (EPIPEN) 0.3 mg/0.3 mL DEVI Inject 0.3 mLs (0.3 mg total) into the muscle once.  1 Device  1  . ipratropium (ATROVENT) 0.03 % nasal spray Place 2 sprays into the nose every 12 (twelve) hours.        Marland Kitchen lisinopril (PRINIVIL,ZESTRIL) 10 MG tablet Take 1 tablet (10 mg total) by mouth daily.  30 tablet  1  . Omega-3 Fatty Acids (FISH OIL CONCENTRATE PO) Take 1 each by mouth daily. 1 tsp daily.       Allergies  Allergen Reactions  . Sesame Oil Anaphylaxis    Sesame seed/oil  . Latex Rash   ROS:  Denies fevers, headaches, dizziness, chest pain,  shortness of breath, URI.  Denies nausea, vomiting, frequent heartburn, bowel changes, urinary complaints.  +allergies, postnasal drip.  Denies vision changes, polydipsia, polyuria.  PHYSICAL EXAM: BP 140/73  Pulse 60  Ht 5' 3.5" (1.613 m)  Wt 210 lb (95.255 kg)  BMI 36.62 kg/m2 142/82 on repeat by MD RA Well developed, pleasant, obese female in no distress HEENT:  PERRL, EOMI, conjunctiva clear.  TM's and EAC's normal.  OP clear.  Nasal mucosa mildly edematous, no purulence.  Sinuses nontender Neck: no lymphadenopathy, thyromegaly or carotid bruit Heart: regular rate and rhythm without murmur Lungs: clear bilaterally Abdomen: soft, nontender, no organomegaly or mass Extremities: no edema, 2+ pulse, normal sensation Skin: no rashes, lesions, bruising Psych: normal mood, affect, hygiene and grooming  Lab Results  Component Value Date   HGBA1C 6.1 09/29/2012   ASSESSMENT/PLAN:  1. Type II or unspecified type diabetes mellitus without mention of complication, not stated as uncontrolled  HgB A1c, metFORMIN (GLUCOPHAGE) 500 MG tablet  2. Essential hypertension, benign  lisinopril (PRINIVIL,ZESTRIL) 10 MG tablet  3. Psoriatic arthritis      DM improved.  Encouraged weight loss.  Continue current meds, diet.  HTN--suboptimally controlled for a diabetic.  Continue atenolol (?if helping with tachycardia or other arrhythmia), and add lisinopril 10mg .  Reviewed risks/side effects. Discussed ARB's (change if she doesn't tolerate ACEI due to cough)  Discussed cardio recommendations, weight loss.  Discussed ways to get more exercise (when pool is closed for season--can try indoor pool at gym, use her exercise bike, etc), portion control  25 min OV, more than 1/2 spent counseling.

## 2012-10-13 DIAGNOSIS — L4050 Arthropathic psoriasis, unspecified: Secondary | ICD-10-CM | POA: Diagnosis not present

## 2012-10-21 DIAGNOSIS — D3130 Benign neoplasm of unspecified choroid: Secondary | ICD-10-CM | POA: Diagnosis not present

## 2012-11-03 ENCOUNTER — Ambulatory Visit (INDEPENDENT_AMBULATORY_CARE_PROVIDER_SITE_OTHER): Payer: Medicare Other | Admitting: Family Medicine

## 2012-11-03 ENCOUNTER — Encounter: Payer: Self-pay | Admitting: Family Medicine

## 2012-11-03 VITALS — BP 122/78 | HR 68 | Ht 63.5 in | Wt 208.0 lb

## 2012-11-03 DIAGNOSIS — I10 Essential (primary) hypertension: Secondary | ICD-10-CM

## 2012-11-03 MED ORDER — LOSARTAN POTASSIUM 50 MG PO TABS: ORAL_TABLET | ORAL | Status: DC

## 2012-11-03 NOTE — Progress Notes (Signed)
Chief Complaint  Patient presents with  . Hypertension    blood pressure follow up.    HPI:  Lisinopril 10mg  was added to her atenolol last month to lower her BP.  She has a little cold now, but feels like she is coughing more overall since last visit. She is having postnasal drainage, and has a nagging cough.  She really thought 2 weeks ago that cough was related to med, now isn't really sure, because her drainage has been worse.  Her BP's initially were 120's/70's after medication was started, but when she checked BP at [facility] it was 143/72.  Had some ham, increased sodium intake over the holidays.  She has also been sick with a GI bug x 3 days.  Had also been sick prior to Christmas.  Diarrhea has improved, just using pepto bismol once daily.  Seems to flare up again after eating yogurt.  Past Medical History  Diagnosis Date  . DM (diabetes mellitus), gestational     with first pregnancy (stillborn)--based on autopsy; not diagnosed during pregnancy  . Seasonal allergies   . Heart murmur   . HTN (hypertension)   . Venous insufficiency   . Varicose veins   . Kidney stone 1997  . Abnormal mammogram 9/07    Right breast--bx sclerosing papilloma  . Osteopenia 7/07    mild  . Vitamin D deficiency 12/09  . Abnormal mammogram 02/2009    Left; suspicious abnormality L breast mammo and u/s.  Biopsy recommended but patient never did nor f/u mammo   History   Social History  . Marital Status: Married    Spouse Name: N/A    Number of Children: N/A  . Years of Education: N/A   Occupational History  . reading specialist, works part-time    Social History Main Topics  . Smoking status: Never Smoker   . Smokeless tobacco: Never Used  . Alcohol Use: Yes     Comment: occasional wine  . Drug Use: No  . Sexually Active:    Other Topics Concern  . Not on file   Social History Narrative   Lives with husband and 2 dogs   Current Outpatient Prescriptions on File Prior to  Visit  Medication Sig Dispense Refill  . atenolol (TENORMIN) 25 MG tablet Take 1 tablet (25 mg total) by mouth daily.  90 tablet  0  . Calcium Citrate-Vitamin D (CALCIUM CITRATE + D PO) Take by mouth 2 (two) times daily. 1 tablespoon twice daily       . ipratropium (ATROVENT) 0.03 % nasal spray Place 2 sprays into the nose every 12 (twelve) hours.        . metFORMIN (GLUCOPHAGE) 500 MG tablet Take 1 tablet (500 mg total) by mouth daily with breakfast.  90 tablet  1  . Multiple Vitamins-Minerals (CENTRUM SILVER PO) Take 1 tablet by mouth daily.        Marland Kitchen EPINEPHrine (EPIPEN) 0.3 mg/0.3 mL DEVI Inject 0.3 mLs (0.3 mg total) into the muscle once.  1 Device  1  . losartan (COZAAR) 50 MG tablet Take 1/2-1 tablet once daily, as directed  30 tablet  3  . Omega-3 Fatty Acids (FISH OIL CONCENTRATE PO) Take 1 each by mouth daily. 1 tsp daily.       Allergies  Allergen Reactions  . Sesame Oil Anaphylaxis    Sesame seed/oil  . Ace Inhibitors Cough  . Latex Rash   ROS:  Denies headaches, dizziness, chest pain, shortness  of breath, edema.  +congestion/PND.  Denies heartburn.  +diarrhea.  Denies fevers, shortness of breath, bleeding/bruising, edema.  PHYSICAL EXAM: BP 140/80  Pulse 68  Ht 5' 3.5" (1.613 m)  Wt 208 lb (94.348 kg)  BMI 36.27 kg/m2 122/78 on repeat by MD, RA Frequent throat-clearing and dry cough, otherwise in no distress.   HEENT:  Nasal mucosa mildly edematous with clear mucus (small amount) in right nares.  Sinuses nontender.  OP clear Neck: no lymphadenopathy Heart: regular rate and rhythm Lungs: clear bilaterally Extremities: no edema psych: normal mood, affect, hygiene and grooming  ASSESSMENT/PLAN:  1. Essential hypertension, benign  losartan (COZAAR) 50 MG tablet   Stop lisinopril due to worsening cough.  Change to losartan.  Start at 1/2 tablet, and monitor BP.  If BP remains >130/80 after a week, then increase to full 50mg  tablet. Call if prefers change to 25mg  tablet  (if BP's controlled at that dose) and/or if she needs 90 day supply.  Risks/side effects reviewed.   F/u as scheduled in June, sooner if BP's abnormal, or other concerns

## 2012-11-03 NOTE — Patient Instructions (Addendum)
Stop the lisinopril. Start taking 1/2 tablet of losartan.  If BP after a week remains >130/80, then increase to full tablet.  Monitor BP regularly.  Call with questions/concerns. Otherwise, follow up as scheduled in June. Call to have prescription changed to 25mg  if 1/2 tablet is working but you prefer NOT to cut tablets.

## 2012-12-01 DIAGNOSIS — L408 Other psoriasis: Secondary | ICD-10-CM | POA: Diagnosis not present

## 2012-12-01 DIAGNOSIS — L819 Disorder of pigmentation, unspecified: Secondary | ICD-10-CM | POA: Diagnosis not present

## 2013-01-12 DIAGNOSIS — M2550 Pain in unspecified joint: Secondary | ICD-10-CM | POA: Diagnosis not present

## 2013-01-12 DIAGNOSIS — L4050 Arthropathic psoriasis, unspecified: Secondary | ICD-10-CM | POA: Diagnosis not present

## 2013-01-17 ENCOUNTER — Telehealth: Payer: Self-pay | Admitting: Family Medicine

## 2013-01-17 NOTE — Telephone Encounter (Signed)
Pt.notified

## 2013-01-17 NOTE — Telephone Encounter (Signed)
Advise pt I have no concerns about her starting these meds per Dr. Dierdre Forth

## 2013-01-17 NOTE — Telephone Encounter (Signed)
Pt called and states Dr. Payton Emerald her rheumatologist wants to start her on infusion of Remacaid and also wants her to start Xantac 150 mg and Claritan.  Please let pt know if this meds are ok for her to take. [phone]

## 2013-01-20 DIAGNOSIS — L4050 Arthropathic psoriasis, unspecified: Secondary | ICD-10-CM | POA: Diagnosis not present

## 2013-02-01 ENCOUNTER — Telehealth: Payer: Self-pay | Admitting: Family Medicine

## 2013-02-01 NOTE — Telephone Encounter (Signed)
Please get details of what she needs (quantity? And which meds on med list--?nasal spray, OTC's/supplements or just rx's?)

## 2013-02-01 NOTE — Telephone Encounter (Signed)
PT CALLED AND IS REQUESTING THAT SHE HAVE WRITTEN RX'S FOR ALL MEDS. SHE IS GOING TO [place] FOR 2 WEEKS AND IT WS RECOMMENDED THAT SHE TAKE THOSE WITH HER. PLEASE CALL WHEN READY.

## 2013-02-02 ENCOUNTER — Telehealth: Payer: Self-pay | Admitting: *Deleted

## 2013-02-02 NOTE — Telephone Encounter (Signed)
rx's handwritten (all done on one, figuring if lost in luggage, all would be lost together?), and on hallway workspace.  If she needs them separate please write or print out and I'll sign.

## 2013-02-02 NOTE — Telephone Encounter (Signed)
Spoke with patient and at the request of her travel agent she is requesting 2 week written rx's as she is travelling to [place] x 2weeks. This is just incase something should happen to her luggage. She needs 5 rx's: 1. Atenolol 25mg  qd 2. Losartan 50mg  qd 3. Metformin 500mg  qd 4. Atrovent Nasal Spray 0.3% nasal spray 5. Epi pen 0.3mg /0.52ml device   She will come by and pick up when ready. Thanks.

## 2013-02-02 NOTE — Telephone Encounter (Signed)
Patient advised that all 5 were written on one rx and she said that would be fine. She will pick up later today.

## 2013-02-25 ENCOUNTER — Ambulatory Visit (INDEPENDENT_AMBULATORY_CARE_PROVIDER_SITE_OTHER): Payer: Medicare Other | Admitting: Family Medicine

## 2013-02-25 ENCOUNTER — Encounter: Payer: Self-pay | Admitting: Family Medicine

## 2013-02-25 VITALS — BP 124/82 | HR 76 | Temp 98.3°F | Wt 214.0 lb

## 2013-02-25 DIAGNOSIS — J209 Acute bronchitis, unspecified: Secondary | ICD-10-CM | POA: Diagnosis not present

## 2013-02-25 DIAGNOSIS — H6692 Otitis media, unspecified, left ear: Secondary | ICD-10-CM

## 2013-02-25 DIAGNOSIS — H6690 Otitis media, unspecified, unspecified ear: Secondary | ICD-10-CM

## 2013-02-25 DIAGNOSIS — J309 Allergic rhinitis, unspecified: Secondary | ICD-10-CM

## 2013-02-25 DIAGNOSIS — J302 Other seasonal allergic rhinitis: Secondary | ICD-10-CM

## 2013-02-25 MED ORDER — AMOXICILLIN 875 MG PO TABS: 875.0000 mg | ORAL_TABLET | Freq: Two times a day (BID) | ORAL | Status: DC

## 2013-02-25 MED ORDER — IPRATROPIUM BROMIDE 0.03 % NA SOLN: 2.0000 | Freq: Two times a day (BID) | NASAL | Status: DC

## 2013-02-25 NOTE — Progress Notes (Signed)
  Subjective:    Patient ID: April Matthews, female    DOB: [birth year], 67 y.o.   MRN: [id number]  HPI She has a six-day history of sore throat and rhinorrhea,nasal congestion, chest congestion with coughing. She just flew back from [place] and does have ear congestion. No fever or chills. She does have seasonal allergies but ran out of her nasal spray. She does not smoke.   Review of Systems     Objective:   Physical Exam alert and in no distress. Voices somewhat hoarseTympanic membrane on the left is dull and vascular, right is normal,canals are normal. Throat is clear. Tonsils are normal. Neck is supple without adenopathy or thyromegaly. Cardiac exam shows a regular sinus rhythm without murmurs or gallops. Lungs are clear to auscultation.        Assessment & Plan:  LOM (left otitis media) - Plan: amoxicillin (AMOXIL) 875 MG tablet  Acute bronchitis - Plan: amoxicillin (AMOXIL) 875 MG tablet  Allergic rhinitis, seasonal - Plan: ipratropium (ATROVENT) 0.03 % nasal spray recommend she use Afrin as needed to help with the ear congestion and get back on her Atrovent nasal spray. Call if further difficulty.

## 2013-02-25 NOTE — Patient Instructions (Signed)
Use Afrin for three days to help with congestion

## 2013-03-02 DIAGNOSIS — L4050 Arthropathic psoriasis, unspecified: Secondary | ICD-10-CM | POA: Diagnosis not present

## 2013-03-02 DIAGNOSIS — M2550 Pain in unspecified joint: Secondary | ICD-10-CM | POA: Diagnosis not present

## 2013-03-15 DIAGNOSIS — L4050 Arthropathic psoriasis, unspecified: Secondary | ICD-10-CM | POA: Diagnosis not present

## 2013-03-22 ENCOUNTER — Other Ambulatory Visit: Payer: Self-pay | Admitting: Family Medicine

## 2013-04-06 ENCOUNTER — Encounter: Payer: BLUE CROSS/BLUE SHIELD | Admitting: Family Medicine

## 2013-04-25 ENCOUNTER — Encounter: Payer: Self-pay | Admitting: Family Medicine

## 2013-04-25 ENCOUNTER — Ambulatory Visit (INDEPENDENT_AMBULATORY_CARE_PROVIDER_SITE_OTHER): Payer: Medicare Other | Admitting: Family Medicine

## 2013-04-25 VITALS — BP 110/76 | HR 72 | Ht 63.0 in | Wt 215.0 lb

## 2013-04-25 DIAGNOSIS — E119 Type 2 diabetes mellitus without complications: Secondary | ICD-10-CM | POA: Diagnosis not present

## 2013-04-25 DIAGNOSIS — J309 Allergic rhinitis, unspecified: Secondary | ICD-10-CM | POA: Diagnosis not present

## 2013-04-25 DIAGNOSIS — L4050 Arthropathic psoriasis, unspecified: Secondary | ICD-10-CM | POA: Diagnosis not present

## 2013-04-25 DIAGNOSIS — I10 Essential (primary) hypertension: Secondary | ICD-10-CM | POA: Diagnosis not present

## 2013-04-25 MED ORDER — LOSARTAN POTASSIUM 50 MG PO TABS: 25.0000 mg | ORAL_TABLET | Freq: Every day | ORAL | Status: DC

## 2013-04-25 MED ORDER — METFORMIN HCL 500 MG PO TABS: 500.0000 mg | ORAL_TABLET | Freq: Every day | ORAL | Status: DC

## 2013-04-25 MED ORDER — ATENOLOL 25 MG PO TABS: ORAL_TABLET | ORAL | Status: DC

## 2013-04-25 NOTE — Patient Instructions (Signed)
Continue your current medications. Return for fasting labs

## 2013-04-25 NOTE — Progress Notes (Signed)
Chief Complaint  Patient presents with  . Diabetes    fasting med check. Needs refill on atrovent.   Patient is NOT fasting today.  Hypertension:  BP's are running 110-132/70's at home.  Denies dizziness, chest pain, headaches.  She has only been taking the 1/2 tablet of losartan, as BP was within goal range (never increased to full).  Cough resolved after stopping the lisinopril.  DM: compliant with meds, without side effects.  She is requesting refill on atrovent nasal spray.  She continues to have some runny nose, drainage, and postnasal drip if she doesn't take it.  She takes Claritin only when getting her Remicaid infusion (also along with zantac, 24 hrs prior to infusion).  Psoriatic arthritis:  She has been started on Remicaid infusion.  Hand and wrist pain is improving, although pain recurring prior to next dose being due.  Past Medical History  Diagnosis Date  . DM (diabetes mellitus), gestational     with first pregnancy (stillborn)--based on autopsy; not diagnosed during pregnancy  . Seasonal allergies   . Heart murmur   . HTN (hypertension)   . Venous insufficiency   . Varicose veins   . Kidney stone 1997  . Abnormal mammogram 9/07    Right breast--bx sclerosing papilloma  . Osteopenia 7/07    mild  . Vitamin D deficiency 12/09  . Abnormal mammogram 02/2009    Left; suspicious abnormality L breast mammo and u/s.  Biopsy recommended but patient never did nor f/u mammo   Past Surgical History  Procedure Laterality Date  . Cesarean section  1977  . Tonsillectomy and adenoidectomy    . Breast biopsy  9/07    right--sclerosing papilloma   History   Social History  . Marital Status: Married    Spouse Name: N/A    Number of Children: N/A  . Years of Education: N/A   Occupational History  . reading specialist, works part-time    Social History Main Topics  . Smoking status: Never Smoker   . Smokeless tobacco: Never Used  . Alcohol Use: Yes     Comment:  occasional wine  . Drug Use: No  . Sexually Active:    Other Topics Concern  . Not on file   Social History Narrative   Lives with husband and 2 dogs   Current outpatient prescriptions:atenolol (TENORMIN) 25 MG tablet, TAKE 1 TABLET BY MOUTH DAILY, Disp: 90 tablet, Rfl: 1;  Calcium Citrate-Vitamin D (CALCIUM CITRATE + D PO), Take by mouth 2 (two) times daily. 1 tablespoon twice daily , Disp: , Rfl: ;  ipratropium (ATROVENT) 0.03 % nasal spray, Place 2 sprays into the nose every 12 (twelve) hours., Disp: 30 mL, Rfl: 11;  Krill Oil 300 MG CAPS, Take 1 each by mouth daily., Disp: , Rfl:  losartan (COZAAR) 50 MG tablet, Take 0.5 tablets (25 mg total) by mouth daily. Take 1/2-1 tablet once daily, as directed, Disp: 45 tablet, Rfl: 1;  metFORMIN (GLUCOPHAGE) 500 MG tablet, Take 1 tablet (500 mg total) by mouth daily with breakfast., Disp: 90 tablet, Rfl: 1;  Multiple Vitamins-Minerals (CENTRUM SILVER PO), Take 1 tablet by mouth daily.  , Disp: , Rfl:  EPINEPHrine (EPIPEN) 0.3 mg/0.3 mL DEVI, Inject 0.3 mLs (0.3 mg total) into the muscle once., Disp: 1 Device, Rfl: 1  Allergies  Allergen Reactions  . Sesame Oil Anaphylaxis    Sesame seed/oil  . Ace Inhibitors Cough  . Latex Rash   ROS:  Denies fevers, shortness of  breath, chest pain, headaches, palpitations, edema, bleeding/bruising, GI complaints or other concerns.  See HPI-some runny nose, no cough.  PHYSICAL EXAM: BP 110/76  Pulse 72  Ht 5\' 3"  (1.6 m)  Wt 215 lb (97.523 kg)  BMI 38.09 kg/m2 Well developed, pleasant female in no distress HEENT:  PERRL, EOMI, conjunctiva clear.  TM's and EAC's normal.  Nasal mucosa mildly edematous, no purulence.  OP clear Neck: no lymphadenopathy, thyromegaly or mass Heart: regular rate and rhythm Lungs: clear bilaterally Abdomen: soft, nontender, no mass Extremities: no edema, 2+ pulse.  Some swelling noted in 2nd and 3rd MCP's bilaterally  Lab Results  Component Value Date   HGBA1C 6.1 04/25/2013    ASSESSMENT/PLAN:  Type II or unspecified type diabetes mellitus without mention of complication, not stated as uncontrolled - controlled - Plan: HgB A1c, Lipid panel, Comprehensive metabolic panel, metFORMIN (GLUCOPHAGE) 500 MG tablet  Essential hypertension, benign - well controlled - Plan: Lipid panel, Comprehensive metabolic panel, atenolol (TENORMIN) 25 MG tablet, losartan (COZAAR) 50 MG tablet  Psoriatic arthritis  Allergic rhinitis, cause unspecified  Allergies--discussed that she can try the claritin daily for allergies, and use the atrovent prn.  We discussed the somewhat unusual regimen she is on for allergies (she has never been on inhaled steroids).  She started on this by her former MD in [place].  Okay to continue it prn, but try using the claritin daily (since she already has it).  Got 1 year of refills of atrovent from Dr. Susann Givens at her last visit.  Return for fasting labs.  F/u 6 months for CPE/med check (med check plus, AWV)

## 2013-04-26 ENCOUNTER — Encounter: Payer: Self-pay | Admitting: Family Medicine

## 2013-04-26 DIAGNOSIS — J309 Allergic rhinitis, unspecified: Secondary | ICD-10-CM | POA: Insufficient documentation

## 2013-04-27 ENCOUNTER — Other Ambulatory Visit: Payer: Medicare Other

## 2013-04-27 DIAGNOSIS — E119 Type 2 diabetes mellitus without complications: Secondary | ICD-10-CM

## 2013-04-27 DIAGNOSIS — I10 Essential (primary) hypertension: Secondary | ICD-10-CM | POA: Diagnosis not present

## 2013-04-27 LAB — COMPREHENSIVE METABOLIC PANEL
Albumin: 4 g/dL (ref 3.5–5.2)
CO2: 28 mEq/L (ref 19–32)
Glucose, Bld: 109 mg/dL — ABNORMAL HIGH (ref 70–99)
Potassium: 4.5 mEq/L (ref 3.5–5.3)
Sodium: 141 mEq/L (ref 135–145)
Total Protein: 6.8 g/dL (ref 6.0–8.3)

## 2013-04-29 ENCOUNTER — Encounter: Payer: Self-pay | Admitting: Family Medicine

## 2013-04-29 DIAGNOSIS — E7800 Pure hypercholesterolemia, unspecified: Secondary | ICD-10-CM | POA: Insufficient documentation

## 2013-05-05 ENCOUNTER — Other Ambulatory Visit: Payer: Self-pay | Admitting: *Deleted

## 2013-05-05 DIAGNOSIS — E7800 Pure hypercholesterolemia, unspecified: Secondary | ICD-10-CM

## 2013-05-10 DIAGNOSIS — L4050 Arthropathic psoriasis, unspecified: Secondary | ICD-10-CM | POA: Diagnosis not present

## 2013-05-19 ENCOUNTER — Encounter: Payer: Self-pay | Admitting: Family Medicine

## 2013-05-19 ENCOUNTER — Ambulatory Visit (INDEPENDENT_AMBULATORY_CARE_PROVIDER_SITE_OTHER): Payer: Medicare Other | Admitting: Family Medicine

## 2013-05-19 VITALS — BP 114/72 | HR 60 | Temp 97.7°F | Ht 63.5 in | Wt 216.0 lb

## 2013-05-19 DIAGNOSIS — R05 Cough: Secondary | ICD-10-CM

## 2013-05-19 DIAGNOSIS — J069 Acute upper respiratory infection, unspecified: Secondary | ICD-10-CM

## 2013-05-19 DIAGNOSIS — R059 Cough, unspecified: Secondary | ICD-10-CM

## 2013-05-19 MED ORDER — BENZONATATE 100 MG PO CAPS: 100.0000 mg | ORAL_CAPSULE | Freq: Three times a day (TID) | ORAL | Status: DC | PRN

## 2013-05-19 NOTE — Progress Notes (Signed)
Chief Complaint  Patient presents with  . Cough    5 days ago started with a ST, turned into a hacking cough. Clear mucus.    4-5 days ago started with sore throat, which progressed to dry cough, postnasal drainage.  Denies sinus pressure, headaches.  Using Atrovent and Claritin daily, with some improvement.  She also took an Aleve this morning, and using lozenges.  Denies shortness of breath or purulent drainage.  +sick exposure--husband with URI/bronchitis last week.  Past Medical History  Diagnosis Date  . DM (diabetes mellitus), gestational     with first pregnancy (stillborn)--based on autopsy; not diagnosed during pregnancy  . Seasonal allergies   . Heart murmur   . HTN (hypertension)   . Venous insufficiency   . Varicose veins   . Kidney stone 1997  . Abnormal mammogram 9/07    Right breast--bx sclerosing papilloma  . Osteopenia 7/07    mild  . Vitamin D deficiency 12/09  . Abnormal mammogram 02/2009    Left; suspicious abnormality L breast mammo and u/s.  Biopsy recommended but patient never did nor f/u mammo   History   Social History  . Marital Status: Married    Spouse Name: N/A    Number of Children: N/A  . Years of Education: N/A   Occupational History  . reading specialist, works part-time    Social History Main Topics  . Smoking status: Never Smoker   . Smokeless tobacco: Never Used  . Alcohol Use: Yes     Comment: occasional wine  . Drug Use: No  . Sexually Active:    Other Topics Concern  . Not on file   Social History Narrative   Lives with husband and 2 dogs    Current outpatient prescriptions:atenolol (TENORMIN) 25 MG tablet, TAKE 1 TABLET BY MOUTH DAILY, Disp: 90 tablet, Rfl: 1;  Calcium Citrate-Vitamin D (CALCIUM CITRATE + D PO), Take by mouth 2 (two) times daily. 1 tablespoon twice daily , Disp: , Rfl: ;  Krill Oil 300 MG CAPS, Take 1 each by mouth daily., Disp: , Rfl:  losartan (COZAAR) 50 MG tablet, Take 0.5 tablets (25 mg total) by mouth  daily. Take 1/2-1 tablet once daily, as directed, Disp: 45 tablet, Rfl: 1;  metFORMIN (GLUCOPHAGE) 500 MG tablet, Take 1 tablet (500 mg total) by mouth daily with breakfast., Disp: 90 tablet, Rfl: 1;  Multiple Vitamins-Minerals (CENTRUM SILVER PO), Take 1 tablet by mouth daily.  , Disp: , Rfl:  EPINEPHrine (EPIPEN) 0.3 mg/0.3 mL DEVI, Inject 0.3 mLs (0.3 mg total) into the muscle once., Disp: 1 Device, Rfl: 1;  ipratropium (ATROVENT) 0.03 % nasal spray, Place 2 sprays into the nose every 12 (twelve) hours., Disp: 30 mL, Rfl: 11  Allergies  Allergen Reactions  . Sesame Oil Anaphylaxis    Sesame seed/oil  . Ace Inhibitors Cough  . Latex Rash   ROS:  Denies fevers, chest pain, dizziness, shortness of breath, nausea, vomiting, rash, myalgias, or other concerns. See HPI.  PHYSICAL EXAM: BP 114/72  Pulse 60  Temp(Src) 97.7 F (36.5 C) (Oral)  Ht 5' 3.5" (1.613 m)  Wt 216 lb (97.977 kg)  BMI 37.66 kg/m2 Well developed, pleasant female with dry cough, in no distress, speaking easily. HEENT:  PERRL, EOMI, conjunctiva clear.  Nasal mucosa-mild edema, no erythema or purulence. OP clear.  Sinuses nontender. TM's and EAC's normal Neck: no lymphadenopathy or mass Heart: regular rate and rhythm Lungs: clear bilaterally  ASSESSMENT/PLAN: Cough - Plan: benzonatate (  TESSALON) 100 MG capsule  Acute upper respiratory infections of unspecified site  No evidence of bacterial infection currently.  Tessalon--fill if mucinex +/-DM isn't effective in controlling symptoms  Call for ABX if develops purulent drainage (phlegm or nasal mucus); return if develops dyspnea, chest pain, or other new symptoms

## 2013-05-19 NOTE — Patient Instructions (Addendum)
Drink plenty of fluids. Continue atrovent nasal spray and claritin. Use guaifenesin and/or dextromethorphan (mucinex, DM version of mucinex or a separate delsym syrup) If your dry hacky cough persists despite this treatment, fill the prescription for tessalon.  This should be taken like a regular capsule/pill.  Call if having fevers, discolored mucus.  Return if having shortness of breath, chest pain

## 2013-05-20 ENCOUNTER — Encounter: Payer: Self-pay | Admitting: Family Medicine

## 2013-06-01 ENCOUNTER — Other Ambulatory Visit: Payer: Self-pay

## 2013-06-23 ENCOUNTER — Other Ambulatory Visit: Payer: Self-pay | Admitting: Family Medicine

## 2013-06-24 DIAGNOSIS — L4050 Arthropathic psoriasis, unspecified: Secondary | ICD-10-CM | POA: Diagnosis not present

## 2013-07-11 DIAGNOSIS — L4050 Arthropathic psoriasis, unspecified: Secondary | ICD-10-CM | POA: Diagnosis not present

## 2013-07-29 DIAGNOSIS — L4050 Arthropathic psoriasis, unspecified: Secondary | ICD-10-CM | POA: Diagnosis not present

## 2013-08-09 ENCOUNTER — Telehealth: Payer: Self-pay | Admitting: Family Medicine

## 2013-08-09 DIAGNOSIS — L4050 Arthropathic psoriasis, unspecified: Secondary | ICD-10-CM | POA: Diagnosis not present

## 2013-08-09 NOTE — Telephone Encounter (Signed)
90/80 doesn't make sense (pulse pressure too low--the difference between systolic and diastolic).  Was that from a machine, and/or did they confirm it manually?  Was she feeling bad with that BP?  What other numbers has she been getting when checking it elsewhere? (we have never had it that low in our office). This is the info that I need in order to determine if we should change/decrease any of her meds.  She can start checking BP's regularly, and if consistently <100 systolic, schedule OV with list of BP's (and her machine, if she has one and it wasn't previously checked).

## 2013-08-09 NOTE — Telephone Encounter (Signed)
Pt called and stated that she went to her rheumatologist today and her BP was 90/80. They were concerned because it was that low once before. They informed pt to call her  PCP and let them know. Pt uses [facility] on [address].

## 2013-08-10 ENCOUNTER — Other Ambulatory Visit: Payer: Self-pay | Admitting: *Deleted

## 2013-08-10 NOTE — Telephone Encounter (Signed)
Spoke with patient and she is feeling just fine, no dizziness or any symptoms at all. She will check bp's and schedule appt if needed.

## 2013-08-12 ENCOUNTER — Other Ambulatory Visit: Payer: Self-pay

## 2013-08-16 ENCOUNTER — Telehealth: Payer: Self-pay | Admitting: Family Medicine

## 2013-08-16 NOTE — Telephone Encounter (Signed)
Message was left for pt to reschedule labs.

## 2013-08-29 DIAGNOSIS — L4050 Arthropathic psoriasis, unspecified: Secondary | ICD-10-CM | POA: Diagnosis not present

## 2013-08-31 DIAGNOSIS — L4050 Arthropathic psoriasis, unspecified: Secondary | ICD-10-CM | POA: Diagnosis not present

## 2013-09-01 ENCOUNTER — Other Ambulatory Visit: Payer: Self-pay

## 2013-09-17 ENCOUNTER — Other Ambulatory Visit: Payer: Self-pay | Admitting: Family Medicine

## 2013-10-07 DIAGNOSIS — L4050 Arthropathic psoriasis, unspecified: Secondary | ICD-10-CM | POA: Diagnosis not present

## 2013-10-07 DIAGNOSIS — Z23 Encounter for immunization: Secondary | ICD-10-CM | POA: Diagnosis not present

## 2013-10-13 ENCOUNTER — Telehealth: Payer: Self-pay | Admitting: *Deleted

## 2013-10-13 ENCOUNTER — Telehealth: Payer: Self-pay | Admitting: Family Medicine

## 2013-10-13 NOTE — Telephone Encounter (Signed)
Left message for patient to return my call.

## 2013-10-13 NOTE — Telephone Encounter (Signed)
Patient had to reschedule CPE, now in March. Wants to know if she can have labs done before CPE  Please call

## 2013-10-13 NOTE — Telephone Encounter (Signed)
She missed an appt in October for lipids.  Future order is still in system.  She should come for that as soon as she can, as she is past due.  She has DM and high blood pressure, and is due for med check now (last visit was in June).  She should NOT wait for March for the med check. Also now due for HbA1c, can be done when she comes for lipids

## 2013-10-17 ENCOUNTER — Telehealth: Payer: Self-pay | Admitting: *Deleted

## 2013-10-17 ENCOUNTER — Encounter: Payer: Medicare Other | Admitting: Family Medicine

## 2013-10-17 NOTE — Telephone Encounter (Signed)
Left message for patient to return my call again.

## 2013-10-24 NOTE — Telephone Encounter (Signed)
Left message for pt to call me back 

## 2013-10-24 NOTE — Telephone Encounter (Signed)
PER DR. Lynelle Doctor She missed an appt in October for lipids. Future order is still in system. She should come for that as soon as she can, as she is past due. She has DM and high blood pressure, and is due for med check now (last visit was in June). She should NOT wait for March for the med check.  Also now due for HbA1c, can be done when she comes for lipids

## 2013-10-25 DIAGNOSIS — H2510 Age-related nuclear cataract, unspecified eye: Secondary | ICD-10-CM | POA: Diagnosis not present

## 2013-10-25 DIAGNOSIS — D3130 Benign neoplasm of unspecified choroid: Secondary | ICD-10-CM | POA: Diagnosis not present

## 2013-10-26 NOTE — Telephone Encounter (Signed)
Left message for pt to call me back 

## 2013-10-27 DIAGNOSIS — R7989 Other specified abnormal findings of blood chemistry: Secondary | ICD-10-CM

## 2013-10-27 HISTORY — DX: Other specified abnormal findings of blood chemistry: R79.89

## 2013-11-03 ENCOUNTER — Telehealth: Payer: Self-pay | Admitting: *Deleted

## 2013-11-03 NOTE — Telephone Encounter (Signed)
Left message for patient to return my call.

## 2013-11-09 ENCOUNTER — Telehealth: Payer: Self-pay | Admitting: *Deleted

## 2013-11-09 DIAGNOSIS — L4050 Arthropathic psoriasis, unspecified: Secondary | ICD-10-CM | POA: Diagnosis not present

## 2013-11-09 NOTE — Telephone Encounter (Signed)
I have left NUMEROUS messages for this patient since 10/13/13. I left one last one today asking her to please return my call.

## 2013-11-11 DIAGNOSIS — L4050 Arthropathic psoriasis, unspecified: Secondary | ICD-10-CM | POA: Diagnosis not present

## 2013-11-12 ENCOUNTER — Other Ambulatory Visit: Payer: Self-pay | Admitting: Family Medicine

## 2013-11-18 DIAGNOSIS — H5200 Hypermetropia, unspecified eye: Secondary | ICD-10-CM | POA: Diagnosis not present

## 2013-11-18 DIAGNOSIS — H2510 Age-related nuclear cataract, unspecified eye: Secondary | ICD-10-CM | POA: Diagnosis not present

## 2013-11-18 DIAGNOSIS — E119 Type 2 diabetes mellitus without complications: Secondary | ICD-10-CM | POA: Diagnosis not present

## 2013-11-18 DIAGNOSIS — D3130 Benign neoplasm of unspecified choroid: Secondary | ICD-10-CM | POA: Diagnosis not present

## 2013-11-30 DIAGNOSIS — D1801 Hemangioma of skin and subcutaneous tissue: Secondary | ICD-10-CM | POA: Diagnosis not present

## 2013-11-30 DIAGNOSIS — L408 Other psoriasis: Secondary | ICD-10-CM | POA: Diagnosis not present

## 2013-12-23 DIAGNOSIS — M2550 Pain in unspecified joint: Secondary | ICD-10-CM | POA: Diagnosis not present

## 2013-12-23 DIAGNOSIS — L4050 Arthropathic psoriasis, unspecified: Secondary | ICD-10-CM | POA: Diagnosis not present

## 2013-12-24 ENCOUNTER — Other Ambulatory Visit: Payer: Self-pay | Admitting: Family Medicine

## 2014-01-11 ENCOUNTER — Encounter: Payer: Medicare Other | Admitting: Family Medicine

## 2014-02-04 ENCOUNTER — Other Ambulatory Visit: Payer: Self-pay | Admitting: Family Medicine

## 2014-02-06 NOTE — Telephone Encounter (Signed)
Her last visit (non-acute) was in June.  She has diabetes, HTN and hyperlipidemia, and is well past due for visit.  She needs med check scheduled for as soon as she can.  Ok to refill x 30 d so she doesn't run out.  You might need to send a letter to her house if she isn't responding to phone calls/messages

## 2014-02-06 NOTE — Telephone Encounter (Signed)
This refill request came in, left numerous messages Dec-Jan to try to get her in for med check. Looks like she r/s her appt from March to June. Do you think I should try to call her again to try to schedule a med check prior to her appt in June?

## 2014-02-10 DIAGNOSIS — L4050 Arthropathic psoriasis, unspecified: Secondary | ICD-10-CM | POA: Diagnosis not present

## 2014-03-08 DIAGNOSIS — L4050 Arthropathic psoriasis, unspecified: Secondary | ICD-10-CM | POA: Diagnosis not present

## 2014-03-31 DIAGNOSIS — L4050 Arthropathic psoriasis, unspecified: Secondary | ICD-10-CM | POA: Diagnosis not present

## 2014-04-11 LAB — HM DIABETES EYE EXAM

## 2014-04-17 ENCOUNTER — Telehealth: Payer: Self-pay | Admitting: Family Medicine

## 2014-04-17 DIAGNOSIS — E119 Type 2 diabetes mellitus without complications: Secondary | ICD-10-CM

## 2014-04-17 DIAGNOSIS — Z79899 Other long term (current) drug therapy: Secondary | ICD-10-CM

## 2014-04-17 DIAGNOSIS — I10 Essential (primary) hypertension: Secondary | ICD-10-CM

## 2014-04-17 DIAGNOSIS — E559 Vitamin D deficiency, unspecified: Secondary | ICD-10-CM

## 2014-04-17 NOTE — Telephone Encounter (Signed)
Orders were entered.  The lipid order was due in October 2015 (pt is past due).  This will also need to be released tomorrow along with the other labs.

## 2014-04-18 NOTE — Telephone Encounter (Signed)
Could not reach pt on phone number that she asked to be called back on however I left a message on her cell # letting her know that she could come in for fasting labs prior to appointment.

## 2014-04-19 ENCOUNTER — Other Ambulatory Visit: Payer: Medicare Other

## 2014-04-19 DIAGNOSIS — Z79899 Other long term (current) drug therapy: Secondary | ICD-10-CM

## 2014-04-19 DIAGNOSIS — E7800 Pure hypercholesterolemia, unspecified: Secondary | ICD-10-CM | POA: Diagnosis not present

## 2014-04-19 DIAGNOSIS — E119 Type 2 diabetes mellitus without complications: Secondary | ICD-10-CM | POA: Diagnosis not present

## 2014-04-19 DIAGNOSIS — I10 Essential (primary) hypertension: Secondary | ICD-10-CM

## 2014-04-19 DIAGNOSIS — E559 Vitamin D deficiency, unspecified: Secondary | ICD-10-CM | POA: Diagnosis not present

## 2014-04-19 LAB — CBC WITH DIFFERENTIAL/PLATELET
Basophils Absolute: 0.1 10*3/uL (ref 0.0–0.1)
Basophils Relative: 1 % (ref 0–1)
Eosinophils Absolute: 0.2 10*3/uL (ref 0.0–0.7)
Eosinophils Relative: 3 % (ref 0–5)
HCT: 36.7 % (ref 36.0–46.0)
Hemoglobin: 12.4 g/dL (ref 12.0–15.0)
LYMPHS ABS: 2.5 10*3/uL (ref 0.7–4.0)
LYMPHS PCT: 41 % (ref 12–46)
MCH: 32.9 pg (ref 26.0–34.0)
MCHC: 33.8 g/dL (ref 30.0–36.0)
MCV: 97.3 fL (ref 78.0–100.0)
Monocytes Absolute: 0.5 10*3/uL (ref 0.1–1.0)
Monocytes Relative: 8 % (ref 3–12)
NEUTROS ABS: 2.8 10*3/uL (ref 1.7–7.7)
NEUTROS PCT: 47 % (ref 43–77)
PLATELETS: 305 10*3/uL (ref 150–400)
RBC: 3.77 MIL/uL — AB (ref 3.87–5.11)
RDW: 13.6 % (ref 11.5–15.5)
WBC: 6 10*3/uL (ref 4.0–10.5)

## 2014-04-19 LAB — HEMOGLOBIN A1C
Hgb A1c MFr Bld: 6.3 % — ABNORMAL HIGH (ref <5.7)
MEAN PLASMA GLUCOSE: 134 mg/dL — AB (ref <117)

## 2014-04-20 ENCOUNTER — Encounter: Payer: Self-pay | Admitting: Family Medicine

## 2014-04-20 ENCOUNTER — Ambulatory Visit (INDEPENDENT_AMBULATORY_CARE_PROVIDER_SITE_OTHER): Payer: Medicare Other | Admitting: Family Medicine

## 2014-04-20 ENCOUNTER — Other Ambulatory Visit (HOSPITAL_COMMUNITY)
Admission: RE | Admit: 2014-04-20 | Discharge: 2014-04-20 | Disposition: A | Discharge status: Home / Self Care | Payer: Medicare Other | Source: Ambulatory Visit | Attending: Family Medicine | Admitting: Family Medicine

## 2014-04-20 VITALS — BP 140/80 | HR 60 | Ht 64.0 in | Wt 207.0 lb

## 2014-04-20 DIAGNOSIS — L4050 Arthropathic psoriasis, unspecified: Secondary | ICD-10-CM | POA: Diagnosis not present

## 2014-04-20 DIAGNOSIS — E039 Hypothyroidism, unspecified: Secondary | ICD-10-CM

## 2014-04-20 DIAGNOSIS — Z0000 Encounter for general adult medical examination without abnormal findings: Secondary | ICD-10-CM | POA: Diagnosis not present

## 2014-04-20 DIAGNOSIS — Z01419 Encounter for gynecological examination (general) (routine) without abnormal findings: Secondary | ICD-10-CM | POA: Diagnosis not present

## 2014-04-20 DIAGNOSIS — Z1151 Encounter for screening for human papillomavirus (HPV): Secondary | ICD-10-CM | POA: Insufficient documentation

## 2014-04-20 DIAGNOSIS — E119 Type 2 diabetes mellitus without complications: Secondary | ICD-10-CM | POA: Diagnosis not present

## 2014-04-20 DIAGNOSIS — Z1211 Encounter for screening for malignant neoplasm of colon: Secondary | ICD-10-CM | POA: Diagnosis not present

## 2014-04-20 DIAGNOSIS — I10 Essential (primary) hypertension: Secondary | ICD-10-CM | POA: Diagnosis not present

## 2014-04-20 DIAGNOSIS — J309 Allergic rhinitis, unspecified: Secondary | ICD-10-CM

## 2014-04-20 DIAGNOSIS — R0989 Other specified symptoms and signs involving the circulatory and respiratory systems: Secondary | ICD-10-CM | POA: Diagnosis not present

## 2014-04-20 DIAGNOSIS — Z23 Encounter for immunization: Secondary | ICD-10-CM | POA: Diagnosis not present

## 2014-04-20 DIAGNOSIS — E559 Vitamin D deficiency, unspecified: Secondary | ICD-10-CM | POA: Diagnosis not present

## 2014-04-20 DIAGNOSIS — E7800 Pure hypercholesterolemia, unspecified: Secondary | ICD-10-CM

## 2014-04-20 LAB — LIPID PANEL
Cholesterol: 182 mg/dL (ref 0–200)
HDL: 42 mg/dL (ref >39)
LDL Cholesterol: 83 mg/dL (ref 0–99)
TRIGLYCERIDES: 284 mg/dL — AB (ref <150)
Total CHOL/HDL Ratio: 4.3 Ratio
VLDL: 57 mg/dL — AB (ref 0–40)

## 2014-04-20 LAB — MICROALBUMIN / CREATININE URINE RATIO
Creatinine, Urine: 122.5 mg/dL
Microalb Creat Ratio: 4.2 mg/g (ref 0.0–30.0)
Microalb, Ur: 0.52 mg/dL (ref 0.00–1.89)

## 2014-04-20 LAB — COMPREHENSIVE METABOLIC PANEL
ALBUMIN: 3.8 g/dL (ref 3.5–5.2)
ALT: 17 U/L (ref 0–35)
AST: 20 U/L (ref 0–37)
Alkaline Phosphatase: 57 U/L (ref 39–117)
BUN: 22 mg/dL (ref 6–23)
CO2: 28 meq/L (ref 19–32)
Calcium: 9.1 mg/dL (ref 8.4–10.5)
Chloride: 102 mEq/L (ref 96–112)
Creat: 0.89 mg/dL (ref 0.50–1.10)
GLUCOSE: 102 mg/dL — AB (ref 70–99)
Potassium: 4.4 mEq/L (ref 3.5–5.3)
Sodium: 138 mEq/L (ref 135–145)
TOTAL PROTEIN: 6.9 g/dL (ref 6.0–8.3)
Total Bilirubin: 0.3 mg/dL (ref 0.2–1.2)

## 2014-04-20 LAB — VITAMIN D 25 HYDROXY (VIT D DEFICIENCY, FRACTURES): Vit D, 25-Hydroxy: 48 ng/mL (ref 30–89)

## 2014-04-20 LAB — TSH: TSH: 6.214 u[IU]/mL — ABNORMAL HIGH (ref 0.350–4.500)

## 2014-04-20 MED ORDER — ATENOLOL 25 MG PO TABS: ORAL_TABLET | ORAL | Status: DC

## 2014-04-20 MED ORDER — METFORMIN HCL 500 MG PO TABS: ORAL_TABLET | ORAL | Status: DC

## 2014-04-20 NOTE — Progress Notes (Signed)
Chief Complaint  Patient presents with  . Diabetes    nonfasting med check plus/AWV with pap or pelvic. Patient does mention that she is bruising easily and was getting little blood/bruise spots from wearing bracelets-stopped taking krill oil, thought that might have helped-it did not help.   April Matthews is a 69 y.o. female who presents for an Annual Wellness Visit and follow-up on her chronic medical issues.  She has no specific complaints.   Hypertension:  BP's are monitored when she gets Remicaid infusions, and BP's have been fine (110/50 three weeks ago).  She had also been monitoring her BP's at home, and were all running 120's/60.  When on the Losartan, she was having 70-90/50's when going for her infusions.  She admits to having more salt this week (son had been cooking, with too much salt).  Lost 25# since December.  Psoriatic Arthritis: well controlled with Remicaid. Osteopenia:  She is past due for f/u DEXA. She has h/o vitamin D deficiency, and has been compliant with taking multi-vitamin and Calcium with D.  AWV:   Other doctors caring for patient include: Dr. Amil Amen (rheum) Dr. Amy Martinique (derm) Dr. Kathrin Penner (ophtho) Also sees an eye oncologist (from [facility]), yearly--follows a mole L eye Dr. Sharlyn Bologna (dentist)  Depression Screen:  See scanned questionnaire--negative. ADL screen:  See scanned questionnaire--occasional hearing trouble noted (at school when kids whisper), fell once (tripped walking at night a couple of years ago on irregular sidewalk) End of Life Issues:  She believes she has a Living Will and healthcare power of attorney.  Immunization History  Administered Date(s) Administered  . Influenza Split 09/08/2012  . PPD Test 09/08/2012  . Tdap 06/19/2011  she states she gets yearly flu shots (at Dr. Melissa Noon office) Shingles vaccine is contraindicated (live virus) Last Pap smear: 09/2008 Last mammogram: 02/2009 (suspicious abnormality noted on last  mammo done at [name]).  She doesn't recall whether or not she had any follow-up mammograms since then (none seen in her chart). She prefers not to go back to [facility]. Last colonoscopy: never Last DEXA: 02/2009--osteopenia Dentist: goes yearly Ophtho: goes yearly (December 2014) Exercise: getting 10K steps daily, swimming 30 laps daily (aerobic/running in the pool, has her own pool).  Past Medical History  Diagnosis Date  . DM (diabetes mellitus), gestational     with first pregnancy (stillborn)--based on autopsy; not diagnosed during pregnancy  . Seasonal allergies   . Heart murmur   . HTN (hypertension)   . Venous insufficiency   . Varicose veins   . Kidney stone 1997  . Abnormal mammogram 9/07    Right breast--bx sclerosing papilloma  . Osteopenia 7/07    mild  . Vitamin D deficiency 12/09  . Abnormal mammogram 02/2009    Left; suspicious abnormality L breast mammo and u/s.  Biopsy recommended but patient never did nor f/u mammo   Past Surgical History  Procedure Laterality Date  . Cesarean section  1977  . Tonsillectomy and adenoidectomy    . Breast biopsy Right 9/07    right--sclerosing papilloma   History   Social History  . Marital Status: Married    Spouse Name: N/A    Number of Children: N/A  . Years of Education: N/A   Occupational History  . reading specialist, works part-time    Social History Main Topics  . Smoking status: Never Smoker   . Smokeless tobacco: Never Used  . Alcohol Use: Yes     Comment: occasional wine  .  Drug Use: No  . Sexual Activity: Yes    Partners: Male   Other Topics Concern  . Not on file   Social History Narrative   Lives with husband. Children live in [place], [place], [place], and [place], [place]   Family History  Problem Relation Age of Onset  . Stroke Mother   . Hypertension Mother   . Cancer Mother     SKIN CANCER  . GER disease Father   . Heart disease Brother   . Cancer Maternal Aunt     breast cancer late 64's  . Cancer  Maternal Grandmother     breast cancer 60's  . Cancer Brother     prostate    Outpatient Encounter Prescriptions as of 04/20/2014  Medication Sig Note  . atenolol (TENORMIN) 25 MG tablet TAKE 1 TABLET BY MOUTH EVERY DAY   . Calcium Citrate-Vitamin D (CALCIUM CITRATE + D PO) Take by mouth 2 (two) times daily. 1 tablespoon twice daily    . metFORMIN (GLUCOPHAGE) 500 MG tablet TAKE 1 TABLET BY MOUTH EVERY DAY WITH BREAKFAST   . Multiple Vitamins-Minerals (CENTRUM SILVER PO) Take 1 tablet by mouth daily.     Marland Kitchen EPINEPHrine (EPIPEN) 0.3 mg/0.3 mL DEVI Inject 0.3 mLs (0.3 mg total) into the muscle once.   Marland Kitchen ipratropium (ATROVENT) 0.03 % nasal spray Place 2 sprays into the nose every 12 (twelve) hours. 04/20/2014: Uses prn, not needing now, mostly in the Fall  . losartan (COZAAR) 50 MG tablet TAKE 1/2-1 TABLET BY MOUTH ONCE DAILY AS DIRECTED 04/20/2014: BP was low, had some dizziness; stopped it 3 months ago  . [DISCONTINUED] atenolol (TENORMIN) 25 MG tablet TAKE 1 TABLET BY MOUTH DAILY   . [DISCONTINUED] benzonatate (TESSALON) 100 MG capsule Take 1-2 capsules (100-200 mg total) by mouth 3 (three) times daily as needed for cough.   . [DISCONTINUED] Krill Oil 300 MG CAPS Take 1 each by mouth daily. 04/20/2014: Stopped a year ago   Allergies  Allergen Reactions  . Sesame Oil Anaphylaxis    Sesame seed/oil  . Ace Inhibitors Cough  . Latex Rash   ROS:  The patient denies anorexia, fever, headaches,  vision changes, hearing changes, ear pain, sore throat, breast concerns, chest pain, palpitations, dizziness, syncope, dyspnea on exertion, cough, swelling, nausea, vomiting, diarrhea, constipation, abdominal pain, melena, hematochezia, indigestion/heartburn, hematuria, incontinence, dysuria, vaginal bleeding, discharge, odor or itch, genital lesions, numbness, tingling, weakness, tremor, suspicious skin lesions, depression, anxiety, abnormal bleeding/bruising, or enlarged lymph nodes. Joint pains controlled  with remicaid.  She denies any thyroid symptoms--no hair/skin/bowel changes, no mood changes.  Weight loss--intentional.  PHYSICAL EXAM: BP 140/80  Pulse 60  Ht 5' 4" (1.626 m)  Wt 207 lb (93.895 kg)  BMI 35.51 kg/m2  General Appearance:    Alert, cooperative, no distress, appears stated age  Head:    Normocephalic, without obvious abnormality, atraumatic  Eyes:    PERRL, conjunctiva/corneas clear, EOM's intact, fundi    benign  Ears:    Normal TM's and external ear canals  Nose:   Nares normal, no purulent drainage or sinus tenderness. Nasal mucosa moderately edematous R>L  Throat:   Lips, mucosa, and tongue normal; teeth and gums normal  Neck:   Supple, no lymphadenopathy;  thyroid:  no   enlargement/tenderness/nodules; no JVD. +R carotid bruit noted  Back:    Spine nontender, no curvature, ROM normal, no CVA     tenderness  Lungs:     Clear to auscultation bilaterally without  wheezes, rales or     ronchi; respirations unlabored  Chest Wall:    No tenderness or deformity   Heart:    Regular rate and rhythm, S1 and S2 normal, no rub   or gallop, 2/6 SEM  Breast Exam:    No tenderness, masses, or nipple discharge or inversion.      No axillary lymphadenopathy  Abdomen:     Soft, non-tender, nondistended, normoactive bowel sounds,    no masses, no hepatosplenomegaly. Nontender, reducible umbilical hernia.  WHSS midline lower abdomen.  Genitalia:    Normal external genitalia without lesions, +atrophic changes.  BUS and vagina normal; cervix without lesions, or cervical motion tenderness. No abnormal vaginal discharge.  Uterus and adnexa not enlarged, nontender, no masses.  Pap performed.  Friable, atrophic mucosa  Rectal:    Normal tone, no masses or tenderness; guaiac negative stool  Extremities:   No clubbing, cyanosis or edema  Pulses:   2+ and symmetric all extremities  Skin:   Skin color, texture, turgor normal, no rashes or lesions  Lymph nodes:   Cervical, supraclavicular, and  axillary nodes normal  Neurologic:   CNII-XII intact, normal strength, sensation and gait; reflexes 2+ and symmetric throughout          Psych:   Normal mood, affect, hygiene and grooming.    Normal diabetic foot exam.    Chemistry      Component Value Date/Time   NA 138 04/19/2014 0839   K 4.4 04/19/2014 0839   CL 102 04/19/2014 0839   CO2 28 04/19/2014 0839   BUN 22 04/19/2014 0839   CREATININE 0.89 04/19/2014 0839      Component Value Date/Time   CALCIUM 9.1 04/19/2014 0839   ALKPHOS 57 04/19/2014 0839   AST 20 04/19/2014 0839   ALT 17 04/19/2014 0839   BILITOT 0.3 04/19/2014 0839     Glucose 102  Lab Results  Component Value Date   CHOL 182 04/19/2014   HDL 42 04/19/2014   LDLCALC 83 04/19/2014   TRIG 284* 04/19/2014   CHOLHDL 4.3 04/19/2014   Lab Results  Component Value Date   TSH 6.214* 04/19/2014   Lab Results  Component Value Date   WBC 6.0 04/19/2014   HGB 12.4 04/19/2014   HCT 36.7 04/19/2014   MCV 97.3 04/19/2014   PLT 305 04/19/2014   Vitamin D-OH 48 Normal urine microalbumin/Cr ratio   ASSESSMENT/PLAN:  Medicare annual wellness visit, initial  Essential hypertension, benign - controlled. Currently just on beta blocker, ARB stopped due to low BP's. monitor for microalbuminuria and restart ARB at lower dose if needed - Plan: atenolol (TENORMIN) 25 MG tablet  Type II or unspecified type diabetes mellitus without mention of complication, not stated as uncontrolled - encouraged continued weight loss, daily exercise, appropriate diet - Plan: metFORMIN (GLUCOPHAGE) 500 MG tablet, Glucose, random, Hemoglobin A1c  Unspecified vitamin D deficiency - adequately replaced.  continue current supplementation  Psoriatic arthritis - well controlled on Remicaid  Pure hypercholesterolemia - TG elevated. diet reviewed in detail.  Restart omega-3.  repeat in 3 mos - Plan: Lipid panel  Allergic rhinitis, cause unspecified  Need for prophylactic vaccination against Streptococcus  pneumoniae (pneumococcus) - counseled re: current/new recommendations, and risks/benefits/side effects of vaccine - Plan: Pneumococcal conjugate vaccine 13-valent  Routine gynecological examination - Plan: Cytology - PAP   Bruit of right carotid artery - Plan: US Carotid Duplex Bilateral  Unspecified hypothyroidism - TSH 6.214; asymptomatic (so treatment not started) -  Plan: TSH, Thyroid Peroxidase Antibody  Screen for colon cancer - Plan: Ambulatory referral to Gastroenterology   Elevated TG, LDL is at goal, <100.  Discussed lowfat diet.  Restart fish oil  Borderline elevation of TSH--asymptomatic.  Declines meds. Recheck in 3 mos, along with antithyroid Ab's  Discussed monthly self breast exams and yearly mammograms--past due (with suspicious last one); she [facility]--she will need to get prior records to them for comparison.  Recommended at least 30 minutes of aerobic activity at least 5 days/week, weight-bearing exercise 2x/week; proper sunscreen use reviewed; healthy diet, including goals of calcium and vitamin D intake and alcohol recommendations (less than or equal to 1 drink/day) reviewed; regular seatbelt use; changing batteries in smoke detectors.  Immunization recommendations discussed--Prevnar-13 today, high dose flu shots yearly.  Colonoscopy recommendations reviewed--strongly encouraged that she get. Risks/benefits/reasons to be screened were reviewed in detail, and she is now willing to proceed. Prefers female (Dr. Olevia Perches or Collene Mares).  Hemoccult kit given.  Pap performed--won't need another for 5 years if normal with no high risk HPV. DEXA past due--[facility] for [facility], so will switch for both.  End of Life:  Given paperwork again or Living Will and healthcare power of attorney, in case she can't locate prior copies.  MOST form reviewed and filled out--Full Code, full care  Diabetic with HTN, now only on beta blocker,  intolerant of ARB due to low BP's.  We briefly discussed cutting back on beta blocker (but also being used to treat palpitations, now well treated), vs only making the adjustment back in dose, and restarting ARB at lower dose if she develops microalbuminuria.    F/u 3 months with fasting labs prior (lipids, A1c, glu, TSH and thyroid ab's)  Total visit time was 60 minutes . Significant counseling performed (diet, reason for colonoscopy, mammogram, DEXA, risks/side effects of procedures, hypothyroidism, etc)

## 2014-04-20 NOTE — Patient Instructions (Signed)
  HEALTH MAINTENANCE RECOMMENDATIONS:  It is recommended that you get at least 30 minutes of aerobic exercise at least 5 days/week (for weight loss, you may need as much as 60-90 minutes). This can be any activity that gets your heart rate up. This can be divided in 10-15 minute intervals if needed, but try and build up your endurance at least once a week.  Weight bearing exercise is also recommended twice weekly.  Eat a healthy diet with lots of vegetables, fruits and fiber.  "Colorful" foods have a lot of vitamins (ie green vegetables, tomatoes, red peppers, etc).  Limit sweet tea, regular sodas and alcoholic beverages, all of which has a lot of calories and sugar.  Up to 1 alcoholic drink daily may be beneficial for women (unless trying to lose weight, watch sugars).  Drink a lot of water.  Calcium recommendations are 1200-1500 mg daily (1500 mg for postmenopausal women or women without ovaries), and vitamin D 1000 IU daily.  This should be obtained from diet and/or supplements (vitamins), and calcium should not be taken all at once, but in divided doses.  Monthly self breast exams and yearly mammograms for women over the age of 102 is recommended.  Sunscreen of at least SPF 30 should be used on all sun-exposed parts of the skin when outside between the hours of 10 am and 4 pm (not just when at beach or pool, but even with exercise, golf, tennis, and yard work!)  Use a sunscreen that says "broad spectrum" so it covers both UVA and UVB rays, and make sure to reapply every 1-2 hours.  Remember to change the batteries in your smoke detectors when changing your clock times in the spring and fall.  Use your seat belt every time you are in a car, and please drive safely and not be distracted with cell phones and texting while driving.   Call the [facility] to schedule a mammogram, and get prior mammos sent from [facility] due to suspicious prev studies (last in 2010).  Also schedule bone density.  We  are referring for colonoscopy.  Restart omega-3 fish oil, and follow lowfat diet to keep triglycerides down.

## 2014-04-21 DIAGNOSIS — R0989 Other specified symptoms and signs involving the circulatory and respiratory systems: Secondary | ICD-10-CM | POA: Insufficient documentation

## 2014-04-21 DIAGNOSIS — E039 Hypothyroidism, unspecified: Secondary | ICD-10-CM | POA: Insufficient documentation

## 2014-04-21 DIAGNOSIS — E038 Other specified hypothyroidism: Secondary | ICD-10-CM | POA: Insufficient documentation

## 2014-04-24 LAB — CYTOLOGY - PAP

## 2014-04-25 ENCOUNTER — Ambulatory Visit
Admission: RE | Admit: 2014-04-25 | Discharge: 2014-04-25 | Disposition: A | Discharge status: Home / Self Care | Payer: Medicare Other | Source: Ambulatory Visit | Attending: Family Medicine | Admitting: Family Medicine

## 2014-04-25 ENCOUNTER — Encounter: Payer: Self-pay | Admitting: Internal Medicine

## 2014-04-25 DIAGNOSIS — R0989 Other specified symptoms and signs involving the circulatory and respiratory systems: Secondary | ICD-10-CM

## 2014-04-30 ENCOUNTER — Other Ambulatory Visit: Payer: Self-pay | Admitting: Family Medicine

## 2014-05-19 DIAGNOSIS — L4050 Arthropathic psoriasis, unspecified: Secondary | ICD-10-CM | POA: Diagnosis not present

## 2014-06-30 ENCOUNTER — Ambulatory Visit (AMBULATORY_SURGERY_CENTER): Payer: Self-pay | Admitting: *Deleted

## 2014-06-30 VITALS — Ht 64.0 in | Wt 208.4 lb

## 2014-06-30 DIAGNOSIS — Z1211 Encounter for screening for malignant neoplasm of colon: Secondary | ICD-10-CM

## 2014-06-30 MED ORDER — MOVIPREP 100 G PO SOLR: 1.0000 | Freq: Once | ORAL | Status: DC

## 2014-06-30 NOTE — Progress Notes (Signed)
No previous colon per pt. ewm Pt states with sedation with c section she felt weird but no past problems with sedation. ewm No home 02 use. ewm No egg allergy. ewm Pt rescheduled to 10-30 due to her work schedule.ewm

## 2014-07-07 DIAGNOSIS — L4050 Arthropathic psoriasis, unspecified: Secondary | ICD-10-CM | POA: Diagnosis not present

## 2014-07-14 ENCOUNTER — Encounter: Payer: Medicare Other | Admitting: Internal Medicine

## 2014-07-19 ENCOUNTER — Other Ambulatory Visit: Payer: Medicare Other

## 2014-07-21 ENCOUNTER — Other Ambulatory Visit: Payer: Medicare Other

## 2014-07-21 DIAGNOSIS — L4050 Arthropathic psoriasis, unspecified: Secondary | ICD-10-CM | POA: Diagnosis not present

## 2014-07-24 ENCOUNTER — Encounter: Payer: Medicare Other | Admitting: Family Medicine

## 2014-07-27 DIAGNOSIS — K5790 Diverticulosis of intestine, part unspecified, without perforation or abscess without bleeding: Secondary | ICD-10-CM

## 2014-07-27 HISTORY — DX: Diverticulosis of intestine, part unspecified, without perforation or abscess without bleeding: K57.90

## 2014-08-02 ENCOUNTER — Telehealth: Payer: Self-pay | Admitting: Family Medicine

## 2014-08-02 DIAGNOSIS — Z23 Encounter for immunization: Secondary | ICD-10-CM | POA: Diagnosis not present

## 2014-08-02 NOTE — Telephone Encounter (Signed)
Left message on patient's vm letting her know of Dr.Knapp's recommendations.

## 2014-08-02 NOTE — Telephone Encounter (Signed)
All flu shots are okay. She cannot have live virus vaccines.  I recommend high dose flu shot for her

## 2014-08-22 DIAGNOSIS — L4050 Arthropathic psoriasis, unspecified: Secondary | ICD-10-CM | POA: Diagnosis not present

## 2014-08-23 ENCOUNTER — Other Ambulatory Visit: Payer: Medicare Other

## 2014-08-23 DIAGNOSIS — E039 Hypothyroidism, unspecified: Secondary | ICD-10-CM

## 2014-08-23 DIAGNOSIS — E7800 Pure hypercholesterolemia, unspecified: Secondary | ICD-10-CM

## 2014-08-23 DIAGNOSIS — E119 Type 2 diabetes mellitus without complications: Secondary | ICD-10-CM

## 2014-08-23 LAB — LIPID PANEL
CHOLESTEROL: 156 mg/dL (ref 0–200)
HDL: 45 mg/dL (ref >39)
LDL CALC: 78 mg/dL (ref 0–99)
Total CHOL/HDL Ratio: 3.5 Ratio
Triglycerides: 164 mg/dL — ABNORMAL HIGH (ref <150)
VLDL: 33 mg/dL (ref 0–40)

## 2014-08-23 LAB — GLUCOSE, RANDOM: Glucose, Bld: 110 mg/dL — ABNORMAL HIGH (ref 70–99)

## 2014-08-24 LAB — HEMOGLOBIN A1C
Hgb A1c MFr Bld: 6.5 % — ABNORMAL HIGH (ref <5.7)
MEAN PLASMA GLUCOSE: 140 mg/dL — AB (ref <117)

## 2014-08-24 LAB — TSH: TSH: 4.242 u[IU]/mL (ref 0.350–4.500)

## 2014-08-24 LAB — THYROID PEROXIDASE ANTIBODY: THYROID PEROXIDASE ANTIBODY: 8 [IU]/mL (ref <9)

## 2014-08-25 ENCOUNTER — Ambulatory Visit (AMBULATORY_SURGERY_CENTER): Payer: Medicare Other | Admitting: Internal Medicine

## 2014-08-25 ENCOUNTER — Encounter: Payer: Self-pay | Admitting: Internal Medicine

## 2014-08-25 VITALS — BP 137/60 | HR 49 | Temp 98.0°F | Resp 22 | Ht 64.0 in | Wt 208.0 lb

## 2014-08-25 DIAGNOSIS — E119 Type 2 diabetes mellitus without complications: Secondary | ICD-10-CM | POA: Diagnosis not present

## 2014-08-25 DIAGNOSIS — Z1211 Encounter for screening for malignant neoplasm of colon: Secondary | ICD-10-CM | POA: Diagnosis not present

## 2014-08-25 DIAGNOSIS — I10 Essential (primary) hypertension: Secondary | ICD-10-CM | POA: Diagnosis not present

## 2014-08-25 LAB — GLUCOSE, CAPILLARY
GLUCOSE-CAPILLARY: 96 mg/dL (ref 70–99)
Glucose-Capillary: 110 mg/dL — ABNORMAL HIGH (ref 70–99)

## 2014-08-25 MED ORDER — SODIUM CHLORIDE 0.9 % IV SOLN: 500.0000 mL | INTRAVENOUS | Status: DC

## 2014-08-25 NOTE — Patient Instructions (Signed)
YOU HAD AN ENDOSCOPIC PROCEDURE TODAY AT [facility]: Refer to the procedure report that was given to you for any specific questions about what was found during the examination.  If the procedure report does not answer your questions, please call your gastroenterologist to clarify.  If you requested that your care partner not be given the details of your procedure findings, then the procedure report has been included in a sealed envelope for you to review at your convenience later.  YOU SHOULD EXPECT: Some feelings of bloating in the abdomen. Passage of more gas than usual.  Walking can help get rid of the air that was put into your GI tract during the procedure and reduce the bloating. If you had a lower endoscopy (such as a colonoscopy or flexible sigmoidoscopy) you may notice spotting of blood in your stool or on the toilet paper. If you underwent a bowel prep for your procedure, then you may not have a normal bowel movement for a few days.  DIET: Your first meal following the procedure should be a light meal and then it is ok to progress to your normal diet.  A half-sandwich or bowl of soup is an example of a good first meal.  Heavy or fried foods are harder to digest and may make you feel nauseous or bloated.  Likewise meals heavy in dairy and vegetables can cause extra gas to form and this can also increase the bloating.  Drink plenty of fluids but you should avoid alcoholic beverages for 24 hours.Try to eat a high fiber diet.  ACTIVITY: Your care partner should take you home directly after the procedure.  You should plan to take it easy, moving slowly for the rest of the day.  You can resume normal activity the day after the procedure however you should NOT DRIVE or use heavy machinery for 24 hours (because of the sedation medicines used during the test).    SYMPTOMS TO REPORT IMMEDIATELY: A gastroenterologist can be reached at any hour.  During normal business hours, 8:30 AM to 5:00  PM Monday through Friday, call [phone].  After hours and on weekends, please call the GI answering service at [phone] who will take a message and have the physician on call contact you.   Following lower endoscopy (colonoscopy or flexible sigmoidoscopy):  Excessive amounts of blood in the stool  Significant tenderness or worsening of abdominal pains  Swelling of the abdomen that is new, acute  Fever of 100F or higher  FOLLOW UP: If any biopsies were taken you will be contacted by phone or by letter within the next 1-3 weeks.  Call your gastroenterologist if you have not heard about the biopsies in 3 weeks.  Our staff will call the home number listed on your records the next business day following your procedure to check on you and address any questions or concerns that you may have at that time regarding the information given to you following your procedure. This is a courtesy call and so if there is no answer at the home number and we have not heard from you through the emergency physician on call, we will assume that you have returned to your regular daily activities without incident.  SIGNATURES/CONFIDENTIALITY: You and/or your care partner have signed paperwork which will be entered into your electronic medical record.  These signatures attest to the fact that that the information above on your After Visit Summary has been reviewed and is understood.  Full  responsibility of the confidentiality of this discharge information lies with you and/or your care-partner.  Try to read the handouts given to you by your recovery room nurse.

## 2014-08-25 NOTE — Op Note (Signed)
[facility]  [address] [place], [id number]   COLONOSCOPY PROCEDURE REPORT  PATIENT: April Matthews, April Matthews  MR#: [id number] BIRTHDATE: Dec 12, 1945 , 68  yrs. old GENDER: female ENDOSCOPIST: Lafayette Dragon, MD REFERRED Terrall Laity, M.D. PROCEDURE DATE:  08/25/2014 PROCEDURE:   Colonoscopy, screening First Screening Colonoscopy - Avg.  risk and is 50 yrs.  old or older Yes.  Prior Negative Screening - Now for repeat screening. N/A  History of Adenoma - Now for follow-up colonoscopy & has been > or = to 3 yrs.  N/A  Polyps Removed Today? No.  Polyps Removed Today? No.  Recommend repeat exam, <10 yrs? Polyps Removed Today? No.  Recommend repeat exam, <10 yrs? No. ASA CLASS:   Class I INDICATIONS:average risk for colon cancer. MEDICATIONS: Monitored anesthesia care and Propofol 200 mg IV  DESCRIPTION OF PROCEDURE:   After the risks benefits and alternatives of the procedure were thoroughly explained, informed consent was obtained.  The digital rectal exam revealed no abnormalities of the rectum.   The [id number]  endoscope was introduced through the anus and advanced to the cecum, which was identified by both the appendix and ileocecal valve. No adverse events experienced.   The quality of the prep was good, using MoviPrep  The instrument was then slowly withdrawn as the colon was fully examined.      COLON FINDINGS: There was mild diverticulosis noted in the sigmoid colon.  Retroflexed views revealed no abnormalities. The time to cecum=2 minutes 18 seconds.  Withdrawal time=8 minutes 01 seconds. The scope was withdrawn and the procedure completed. COMPLICATIONS: There were no immediate complications.  ENDOSCOPIC IMPRESSION: Mild diverticulosis was noted in the sigmoid colon  RECOMMENDATIONS: High fiber diet Recall colonoscopy in 10 years  eSigned:  Lafayette Dragon, MD 08/25/2014 9:10 AM   cc:

## 2014-08-25 NOTE — Progress Notes (Signed)
Report to PACU, RN, vss, BBS= Clear.  

## 2014-08-28 ENCOUNTER — Telehealth: Payer: Self-pay

## 2014-08-28 ENCOUNTER — Encounter: Payer: Self-pay | Admitting: Internal Medicine

## 2014-08-28 NOTE — Telephone Encounter (Signed)
Left message on answering machine. 

## 2014-08-30 ENCOUNTER — Encounter: Payer: Self-pay | Admitting: Family Medicine

## 2014-08-30 ENCOUNTER — Ambulatory Visit (INDEPENDENT_AMBULATORY_CARE_PROVIDER_SITE_OTHER): Payer: Medicare Other | Admitting: Family Medicine

## 2014-08-30 VITALS — BP 140/90 | HR 68 | Ht 63.5 in | Wt 206.0 lb

## 2014-08-30 DIAGNOSIS — R7989 Other specified abnormal findings of blood chemistry: Secondary | ICD-10-CM

## 2014-08-30 DIAGNOSIS — E039 Hypothyroidism, unspecified: Secondary | ICD-10-CM

## 2014-08-30 DIAGNOSIS — I10 Essential (primary) hypertension: Secondary | ICD-10-CM

## 2014-08-30 DIAGNOSIS — E119 Type 2 diabetes mellitus without complications: Secondary | ICD-10-CM | POA: Diagnosis not present

## 2014-08-30 DIAGNOSIS — Z5181 Encounter for therapeutic drug level monitoring: Secondary | ICD-10-CM

## 2014-08-30 DIAGNOSIS — E781 Pure hyperglyceridemia: Secondary | ICD-10-CM

## 2014-08-30 DIAGNOSIS — J302 Other seasonal allergic rhinitis: Secondary | ICD-10-CM

## 2014-08-30 DIAGNOSIS — R945 Abnormal results of liver function studies: Secondary | ICD-10-CM

## 2014-08-30 MED ORDER — METFORMIN HCL ER 500 MG PO TB24: 500.0000 mg | ORAL_TABLET | Freq: Every day | ORAL | Status: DC

## 2014-08-30 MED ORDER — IPRATROPIUM BROMIDE 0.03 % NA SOLN: 2.0000 | Freq: Two times a day (BID) | NASAL | Status: DC

## 2014-08-30 NOTE — Progress Notes (Signed)
Chief Complaint  Patient presents with  . Hypertension    nonfasting med check.    Hypertension:  BP's were running much too low when checked at [employer] infusion visits when she was on the 25mg  of losartan in addition to her atenolol.  BP's have been running on average 108/50's-60's since being off the losartan.  She has felt so much better since stopping it (no longer feeling dizzy).  She has been on atenolol for a very long time (she believes started for palpitations/irregular heartbeat, many years ago).  Obesity:  She continues to wear her Fitbit, getting 04-7999 steps/day; when she was getting 10,000 steps/day, she was able to lose weight.  Her pool is closed, so not swimming, but hasn't been back on the exercise bike yet.  Diabetes:  She has been on metformin for about 2 years for pre-diabetes.  She doesn't check her blood sugars.  Denies polydipsia, polyuria.  Sees Dr. Kathrin Penner (ophtho) every December, and has that appointment scheduled. Checks feet regularly--denies numbness, tingling, pain or lesions.  She still has not scheduled her mammogram (had abnormal Left mammogram through [facility] in 2010)--she thought she needed referral/prescription. Denies any palpable masses.  Psoriatic arthritis:  She had labs done by Dr. Amil Amen last week, which showed significantly elevated AST and ALT (103, 354).  She is scheduled for recheck next week. She denies abdominal pain, any change in medications (including tylenol, NSAIDs, no change in alcohol).  Denies abdominal pain.  She had colonoscopy on 10/30, which was normal other than some mild diverticulosis.  Repeat recommended in 10 years.  Past Medical History  Diagnosis Date  . DM (diabetes mellitus), gestational     with first pregnancy (stillborn)--based on autopsy; not diagnosed during pregnancy  . Seasonal allergies   . Heart murmur   . HTN (hypertension)   . Venous insufficiency   . Varicose veins   . Kidney stone 1997  . Abnormal  mammogram 9/07    Right breast--bx sclerosing papilloma  . Osteopenia 7/07    mild  . Vitamin D deficiency 12/09  . Abnormal mammogram 02/2009    Left; suspicious abnormality L breast mammo and u/s.  Biopsy recommended but patient never did nor f/u mammo  . Psoriatic arthritis     on remicade (Dr. Amil Amen)  . Diabetes type 2, controlled     A1c 6.5 07/2014 (had been prediabetic prior)  . Abnormal TSH 2015    negative Ab's; borderline on repeat   Past Surgical History  Procedure Laterality Date  . Cesarean section  1977  . Tonsillectomy and adenoidectomy    . Breast biopsy Right 9/07    right--sclerosing papilloma   History   Social History  . Marital Status: Married    Spouse Name: N/A    Number of Children: N/A  . Years of Education: N/A   Occupational History  . reading specialist, works part-time    Social History Main Topics  . Smoking status: Never Smoker   . Smokeless tobacco: Never Used  . Alcohol Use: 0.0 oz/week    0 Not specified per week     Comment: occasional wine (once a month)  . Drug Use: No  . Sexual Activity:    Partners: Male   Other Topics Concern  . Not on file   Social History Narrative   Lives with husband. Children live in [place], [place], [place], and [place], [place]    Outpatient Encounter Prescriptions as of 08/30/2014  Medication Sig Note  . atenolol (  TENORMIN) 25 MG tablet TAKE 1 TABLET BY MOUTH EVERY DAY   . Calcium Citrate-Vitamin D (CALCIUM CITRATE + D PO) Take by mouth 2 (two) times daily. 1 tablespoon twice daily    . EPINEPHrine (EPIPEN) 0.3 mg/0.3 mL DEVI Inject 0.3 mLs (0.3 mg total) into the muscle once.   . inFLIXimab (REMICADE) 100 MG injection Inject 100 mg into the vein every 6 (six) weeks.   Marland Kitchen ipratropium (ATROVENT) 0.03 % nasal spray Place 2 sprays into the nose every 12 (twelve) hours. 08/30/2014: Uses prn, usually in the Fall   . metFORMIN (GLUCOPHAGE) 500 MG tablet Take 500 mg by mouth daily.    . Multiple Vitamins-Minerals  (CENTRUM SILVER PO) Take 1 tablet by mouth daily.      Allergies  Allergen Reactions  . Sesame Oil Anaphylaxis    Sesame seed/oil  . Ace Inhibitors Cough  . Latex Rash   ROS:  No fevers, chills, URI symptoms.  Allergies are controlled with the atrovent (symptoms just in the morning).  No cough, shortness of breath, wheezing, chest pain, palpitations. Denies urinary symptoms, bleeding, bruising, rashes.  Denies any abdominal pain, nausea, vomiting, bowel changes (other than recent GI prep for colonoscopy) Some reflux when eating chocolate and caffeine--improved since she cut back. No headaches, dizziness, chest pain.  No numbness, tingling, weakness. See HPI.  PHYSICAL EXAM: BP 140/90 mmHg  Pulse 68  Ht 5' 3.5" (1.613 m)  Wt 206 lb (93.441 kg)  BMI 35.91 kg/m2  (BP was noted to be 137/60 at colonoscopy last week) Well developed, pleasant female in no distress HEENT: PERRL, EOMI, conjunctiva clear. TM's and EAC's normal. Nasal mucosa mildly edematous, no purulence or drainage.  Sinuses nontender. OP clear Neck: no lymphadenopathy, thyromegaly or mass, no bruit Heart: regular rate and rhythm Lungs: clear bilaterally, no wheezes, rales, ronchi Abdomen: soft, nontender, no mass Extremities: no edema, 2+ pulse. Normal sensation Psych: normal mood, affect, hygiene and grooming Neuro: alert and oriented. Cranial nerves intact. Normal strength, gait  Lab Results  Component Value Date   HGBA1C 6.5* 08/23/2014   Fasting glucose 110  Lab Results  Component Value Date   TSH 4.242 08/23/2014   Antithyroid ab's 8 (normal)  Lab Results  Component Value Date   CHOL 156 08/23/2014   HDL 45 08/23/2014   LDLCALC 78 08/23/2014   TRIG 164* 08/23/2014   CHOLHDL 3.5 08/23/2014   ASSESSMENT/PLAN:  Type 2 diabetes mellitus, controlled - A1c slightly higher. Weight loss, exercise, diet. Change to ER metformin for better coverage throughout the day (vs BID) - Plan: metFORMIN  (GLUCOPHAGE-XR) 500 MG 24 hr tablet  Essential hypertension, benign - elevated today; normal elsewhere, and recently at colonoscopy.  low sodium diet; daily exercise, weight loss. consider losartan if remains elevated or +microalb  Hypothyroidism, unspecified hypothyroidism type - subclinical.  asymptomatic. negative Ab's. follow once yearly, sooner if symptoms develop  Elevated LFTs - recheck as scheduled next week; avoid alcohol, acetaminophen  Allergic rhinitis, seasonal - continue atrovent prn - Plan: ipratropium (ATROVENT) 0.03 % nasal spray  Hypertriglyceridemia--improved since last check, still mildly elevated. Diet reviewed.  DM--change to extended release metformin.  Encouraged aerobic activity and weight loss.  Elevated LFT's--avoid alcohol and acetaminophen.  Recheck as scheduled next week. May need liver imaging if remains elevated.  Elevated BP--suspect white coat.  Normal at infusion center and colonoscopy. Not interested in changing to losartan at this time.  Continue to monitor at home, and let us know if BP's  are >135/85.  Consider bringing home monitor to have accuracy verified. Continue to monitor the urine microalbumin, and if elevated will need to restart low dose losartan (and taper down the atenolol so as not to bottom out the BP).  F/u 6 months--ok to wait until June and do with [facility]V.

## 2014-08-30 NOTE — Patient Instructions (Signed)
Elevated Liver tests--avoid alcohol and acetaminophen.  Recheck as scheduled next week. May need liver imaging if remains elevated.  Elevated Blood pressure--suspect "white coat" component.  Normal at infusion center and colonoscopy. Not interested in changing to losartan at this time.  Continue to monitor at home, and let us know if BP's are >135/85.  Consider bringing home monitor to have accuracy verified. Continue to monitor the urine microalbumin, and if elevated will need to restart low dose losartan (and taper down the atenolol so as not to bottom out the BP).

## 2014-09-05 ENCOUNTER — Encounter: Payer: Self-pay | Admitting: Family Medicine

## 2014-09-06 ENCOUNTER — Encounter: Payer: Self-pay | Admitting: Family Medicine

## 2014-09-06 DIAGNOSIS — R7989 Other specified abnormal findings of blood chemistry: Secondary | ICD-10-CM | POA: Diagnosis not present

## 2014-09-08 ENCOUNTER — Other Ambulatory Visit: Payer: Self-pay | Admitting: Family Medicine

## 2014-10-13 DIAGNOSIS — L4050 Arthropathic psoriasis, unspecified: Secondary | ICD-10-CM | POA: Diagnosis not present

## 2014-11-24 DIAGNOSIS — L4050 Arthropathic psoriasis, unspecified: Secondary | ICD-10-CM | POA: Diagnosis not present

## 2014-11-24 DIAGNOSIS — L409 Psoriasis, unspecified: Secondary | ICD-10-CM | POA: Diagnosis not present

## 2014-11-24 DIAGNOSIS — E119 Type 2 diabetes mellitus without complications: Secondary | ICD-10-CM | POA: Diagnosis not present

## 2014-11-24 DIAGNOSIS — H5203 Hypermetropia, bilateral: Secondary | ICD-10-CM | POA: Diagnosis not present

## 2014-11-24 DIAGNOSIS — Z79899 Other long term (current) drug therapy: Secondary | ICD-10-CM | POA: Diagnosis not present

## 2014-11-24 DIAGNOSIS — R7989 Other specified abnormal findings of blood chemistry: Secondary | ICD-10-CM | POA: Diagnosis not present

## 2014-11-24 DIAGNOSIS — H43811 Vitreous degeneration, right eye: Secondary | ICD-10-CM | POA: Diagnosis not present

## 2014-11-24 DIAGNOSIS — D3132 Benign neoplasm of left choroid: Secondary | ICD-10-CM | POA: Diagnosis not present

## 2014-11-24 LAB — HM DIABETES EYE EXAM

## 2014-11-29 ENCOUNTER — Encounter: Payer: Self-pay | Admitting: *Deleted

## 2014-12-01 DIAGNOSIS — L4050 Arthropathic psoriasis, unspecified: Secondary | ICD-10-CM | POA: Diagnosis not present

## 2014-12-22 ENCOUNTER — Other Ambulatory Visit: Payer: Self-pay | Admitting: Dermatology

## 2014-12-22 DIAGNOSIS — I8311 Varicose veins of right lower extremity with inflammation: Secondary | ICD-10-CM | POA: Diagnosis not present

## 2014-12-22 DIAGNOSIS — D225 Melanocytic nevi of trunk: Secondary | ICD-10-CM | POA: Diagnosis not present

## 2014-12-22 DIAGNOSIS — L400 Psoriasis vulgaris: Secondary | ICD-10-CM | POA: Diagnosis not present

## 2014-12-22 DIAGNOSIS — L821 Other seborrheic keratosis: Secondary | ICD-10-CM | POA: Diagnosis not present

## 2014-12-22 DIAGNOSIS — I788 Other diseases of capillaries: Secondary | ICD-10-CM | POA: Diagnosis not present

## 2014-12-22 DIAGNOSIS — D1801 Hemangioma of skin and subcutaneous tissue: Secondary | ICD-10-CM | POA: Diagnosis not present

## 2014-12-22 DIAGNOSIS — I8392 Asymptomatic varicose veins of left lower extremity: Secondary | ICD-10-CM | POA: Diagnosis not present

## 2014-12-22 DIAGNOSIS — D2362 Other benign neoplasm of skin of left upper limb, including shoulder: Secondary | ICD-10-CM | POA: Diagnosis not present

## 2014-12-22 DIAGNOSIS — I8312 Varicose veins of left lower extremity with inflammation: Secondary | ICD-10-CM | POA: Diagnosis not present

## 2014-12-22 DIAGNOSIS — L814 Other melanin hyperpigmentation: Secondary | ICD-10-CM | POA: Diagnosis not present

## 2014-12-22 DIAGNOSIS — L918 Other hypertrophic disorders of the skin: Secondary | ICD-10-CM | POA: Diagnosis not present

## 2014-12-22 DIAGNOSIS — D485 Neoplasm of uncertain behavior of skin: Secondary | ICD-10-CM | POA: Diagnosis not present

## 2015-01-22 DIAGNOSIS — L4050 Arthropathic psoriasis, unspecified: Secondary | ICD-10-CM | POA: Diagnosis not present

## 2015-01-25 ENCOUNTER — Other Ambulatory Visit: Payer: Self-pay

## 2015-01-25 DIAGNOSIS — Z1231 Encounter for screening mammogram for malignant neoplasm of breast: Secondary | ICD-10-CM

## 2015-02-09 ENCOUNTER — Ambulatory Visit
Admission: RE | Admit: 2015-02-09 | Discharge: 2015-02-09 | Disposition: A | Discharge status: Home / Self Care | Payer: Medicare Other | Source: Ambulatory Visit

## 2015-02-09 DIAGNOSIS — Z1231 Encounter for screening mammogram for malignant neoplasm of breast: Secondary | ICD-10-CM | POA: Diagnosis not present

## 2015-02-28 ENCOUNTER — Other Ambulatory Visit: Payer: Self-pay | Admitting: Family Medicine

## 2015-03-08 ENCOUNTER — Other Ambulatory Visit: Payer: Self-pay | Admitting: Family Medicine

## 2015-03-16 DIAGNOSIS — L4050 Arthropathic psoriasis, unspecified: Secondary | ICD-10-CM | POA: Diagnosis not present

## 2015-03-23 DIAGNOSIS — L409 Psoriasis, unspecified: Secondary | ICD-10-CM | POA: Diagnosis not present

## 2015-03-23 DIAGNOSIS — R7989 Other specified abnormal findings of blood chemistry: Secondary | ICD-10-CM | POA: Diagnosis not present

## 2015-04-12 DIAGNOSIS — D3132 Benign neoplasm of left choroid: Secondary | ICD-10-CM | POA: Diagnosis not present

## 2015-04-12 DIAGNOSIS — H43811 Vitreous degeneration, right eye: Secondary | ICD-10-CM | POA: Diagnosis not present

## 2015-04-12 DIAGNOSIS — E119 Type 2 diabetes mellitus without complications: Secondary | ICD-10-CM | POA: Diagnosis not present

## 2015-04-17 ENCOUNTER — Encounter: Payer: Self-pay | Admitting: Internal Medicine

## 2015-04-18 ENCOUNTER — Encounter: Payer: Self-pay | Admitting: Family Medicine

## 2015-04-19 ENCOUNTER — Other Ambulatory Visit: Payer: Medicare Other

## 2015-04-20 ENCOUNTER — Other Ambulatory Visit: Payer: Self-pay | Admitting: Family Medicine

## 2015-04-20 ENCOUNTER — Other Ambulatory Visit: Payer: Medicare Other

## 2015-04-20 DIAGNOSIS — E119 Type 2 diabetes mellitus without complications: Secondary | ICD-10-CM

## 2015-04-20 DIAGNOSIS — E781 Pure hyperglyceridemia: Secondary | ICD-10-CM | POA: Diagnosis not present

## 2015-04-20 DIAGNOSIS — Z5181 Encounter for therapeutic drug level monitoring: Secondary | ICD-10-CM

## 2015-04-20 DIAGNOSIS — E559 Vitamin D deficiency, unspecified: Secondary | ICD-10-CM | POA: Diagnosis not present

## 2015-04-20 DIAGNOSIS — R799 Abnormal finding of blood chemistry, unspecified: Secondary | ICD-10-CM | POA: Diagnosis not present

## 2015-04-20 DIAGNOSIS — R945 Abnormal results of liver function studies: Secondary | ICD-10-CM

## 2015-04-20 DIAGNOSIS — R7989 Other specified abnormal findings of blood chemistry: Secondary | ICD-10-CM | POA: Diagnosis not present

## 2015-04-20 DIAGNOSIS — I10 Essential (primary) hypertension: Secondary | ICD-10-CM | POA: Diagnosis not present

## 2015-04-20 LAB — CBC WITH DIFFERENTIAL/PLATELET
Basophils Absolute: 0.1 10*3/uL (ref 0.0–0.1)
Basophils Relative: 1 % (ref 0–1)
EOS PCT: 3 % (ref 0–5)
Eosinophils Absolute: 0.2 10*3/uL (ref 0.0–0.7)
HCT: 40.4 % (ref 36.0–46.0)
Hemoglobin: 13.5 g/dL (ref 12.0–15.0)
LYMPHS ABS: 2.3 10*3/uL (ref 0.7–4.0)
LYMPHS PCT: 32 % (ref 12–46)
MCH: 32.5 pg (ref 26.0–34.0)
MCHC: 33.4 g/dL (ref 30.0–36.0)
MCV: 97.3 fL (ref 78.0–100.0)
MONO ABS: 0.6 10*3/uL (ref 0.1–1.0)
MONOS PCT: 8 % (ref 3–12)
MPV: 10 fL (ref 8.6–12.4)
Neutro Abs: 4 10*3/uL (ref 1.7–7.7)
Neutrophils Relative %: 56 % (ref 43–77)
Platelets: 311 10*3/uL (ref 150–400)
RBC: 4.15 MIL/uL (ref 3.87–5.11)
RDW: 13.9 % (ref 11.5–15.5)
WBC: 7.1 10*3/uL (ref 4.0–10.5)

## 2015-04-20 LAB — HEMOGLOBIN A1C
Hgb A1c MFr Bld: 6.6 % — ABNORMAL HIGH (ref <5.7)
Mean Plasma Glucose: 143 mg/dL — ABNORMAL HIGH (ref <117)

## 2015-04-21 LAB — COMPREHENSIVE METABOLIC PANEL
ALK PHOS: 60 U/L (ref 39–117)
ALT: 15 U/L (ref 0–35)
AST: 16 U/L (ref 0–37)
Albumin: 3.8 g/dL (ref 3.5–5.2)
BILIRUBIN TOTAL: 0.4 mg/dL (ref 0.2–1.2)
BUN: 30 mg/dL — ABNORMAL HIGH (ref 6–23)
CALCIUM: 9.4 mg/dL (ref 8.4–10.5)
CO2: 28 mEq/L (ref 19–32)
Chloride: 104 mEq/L (ref 96–112)
Creat: 1.03 mg/dL (ref 0.50–1.10)
Glucose, Bld: 109 mg/dL — ABNORMAL HIGH (ref 70–99)
Potassium: 4.7 mEq/L (ref 3.5–5.3)
SODIUM: 141 meq/L (ref 135–145)
TOTAL PROTEIN: 7.2 g/dL (ref 6.0–8.3)

## 2015-04-21 LAB — LIPID PANEL
CHOL/HDL RATIO: 3.9 ratio
Cholesterol: 154 mg/dL (ref 0–200)
HDL: 39 mg/dL — AB (ref >=46)
LDL CALC: 80 mg/dL (ref 0–99)
Triglycerides: 176 mg/dL — ABNORMAL HIGH (ref <150)
VLDL: 35 mg/dL (ref 0–40)

## 2015-04-21 LAB — MICROALBUMIN / CREATININE URINE RATIO
Creatinine, Urine: 178.2 mg/dL
Microalb Creat Ratio: 4.5 mg/g (ref 0.0–30.0)
Microalb, Ur: 0.8 mg/dL (ref <2.0)

## 2015-04-23 ENCOUNTER — Ambulatory Visit (INDEPENDENT_AMBULATORY_CARE_PROVIDER_SITE_OTHER): Payer: Medicare Other | Admitting: Family Medicine

## 2015-04-23 ENCOUNTER — Encounter: Payer: Self-pay | Admitting: Family Medicine

## 2015-04-23 VITALS — BP 124/78 | HR 72 | Ht 63.5 in | Wt 221.4 lb

## 2015-04-23 DIAGNOSIS — I10 Essential (primary) hypertension: Secondary | ICD-10-CM

## 2015-04-23 DIAGNOSIS — L4050 Arthropathic psoriasis, unspecified: Secondary | ICD-10-CM | POA: Diagnosis not present

## 2015-04-23 DIAGNOSIS — Z23 Encounter for immunization: Secondary | ICD-10-CM | POA: Diagnosis not present

## 2015-04-23 DIAGNOSIS — E559 Vitamin D deficiency, unspecified: Secondary | ICD-10-CM

## 2015-04-23 DIAGNOSIS — E7800 Pure hypercholesterolemia, unspecified: Secondary | ICD-10-CM

## 2015-04-23 DIAGNOSIS — Z01419 Encounter for gynecological examination (general) (routine) without abnormal findings: Secondary | ICD-10-CM | POA: Diagnosis not present

## 2015-04-23 DIAGNOSIS — Z0000 Encounter for general adult medical examination without abnormal findings: Secondary | ICD-10-CM

## 2015-04-23 DIAGNOSIS — E038 Other specified hypothyroidism: Secondary | ICD-10-CM | POA: Diagnosis not present

## 2015-04-23 DIAGNOSIS — L409 Psoriasis, unspecified: Secondary | ICD-10-CM | POA: Diagnosis not present

## 2015-04-23 DIAGNOSIS — M8580 Other specified disorders of bone density and structure, unspecified site: Secondary | ICD-10-CM

## 2015-04-23 DIAGNOSIS — E119 Type 2 diabetes mellitus without complications: Secondary | ICD-10-CM | POA: Diagnosis not present

## 2015-04-23 DIAGNOSIS — Z780 Asymptomatic menopausal state: Secondary | ICD-10-CM | POA: Diagnosis not present

## 2015-04-23 DIAGNOSIS — E039 Hypothyroidism, unspecified: Secondary | ICD-10-CM

## 2015-04-23 DIAGNOSIS — R0989 Other specified symptoms and signs involving the circulatory and respiratory systems: Secondary | ICD-10-CM | POA: Diagnosis not present

## 2015-04-23 LAB — VITAMIN D 25 HYDROXY (VIT D DEFICIENCY, FRACTURES): Vit D, 25-Hydroxy: 27 ng/mL — ABNORMAL LOW (ref 30–100)

## 2015-04-23 LAB — HEMOCCULT GUIAC POC 1CARD (OFFICE): FECAL OCCULT BLD: NEGATIVE

## 2015-04-23 NOTE — Patient Instructions (Addendum)
  HEALTH MAINTENANCE RECOMMENDATIONS:  It is recommended that you get at least 30 minutes of aerobic exercise at least 5 days/week (for weight loss, you may need as much as 60-90 minutes). This can be any activity that gets your heart rate up. This can be divided in 10-15 minute intervals if needed, but try and build up your endurance at least once a week.  Weight bearing exercise is also recommended twice weekly.  Eat a healthy diet with lots of vegetables, fruits and fiber.  "Colorful" foods have a lot of vitamins (ie green vegetables, tomatoes, red peppers, etc).  Limit sweet tea, regular sodas and alcoholic beverages, all of which has a lot of calories and sugar.  Up to 1 alcoholic drink daily may be beneficial for women (unless trying to lose weight, watch sugars).  Drink a lot of water.  Calcium recommendations are 1200-1500 mg daily (1500 mg for postmenopausal women or women without ovaries), and vitamin D 1000 IU daily.  This should be obtained from diet and/or supplements (vitamins), and calcium should not be taken all at once, but in divided doses.  Monthly self breast exams and yearly mammograms for women over the age of 51 is recommended.  Sunscreen of at least SPF 30 should be used on all sun-exposed parts of the skin when outside between the hours of 10 am and 4 pm (not just when at beach or pool, but even with exercise, golf, tennis, and yard work!)  Use a sunscreen that says "broad spectrum" so it covers both UVA and UVB rays, and make sure to reapply every 1-2 hours.  Remember to change the batteries in your smoke detectors when changing your clock times in the spring and fall.  Use your seat belt every time you are in a car, and please drive safely and not be distracted with cell phones and texting while driving.   Ms. Mesmer , Thank you for taking time to come for your Medicare Wellness Visit. I appreciate your ongoing commitment to your health goals. Please review the following  plan we discussed and let me know if I can assist you in the future.   These are the goals we discussed: Goals    None      This is a list of the screening recommended for you and due dates:  Health Maintenance  Topic Date Due  . Complete foot exam   12/22/1955  . Shingles Vaccine  12/21/2005  . Pneumonia vaccines (2 of 2 - PPSV23) 04/21/2015  . Flu Shot  05/28/2015  . Hemoglobin A1C  10/20/2015  . Eye exam for diabetics  11/25/2015  . Urine Protein Check  04/19/2016  . Mammogram  02/08/2017  . Tetanus Vaccine  06/18/2021  . Colon Cancer Screening  08/25/2024  . DEXA scan (bone density measurement)  Completed    Pneumonia vaccine was given today. Foot exam also done today (not sure why the computer doesn't detect it).  Shingles vaccine is contraindicated. Get the bone density (it says completed, but you are due to have it rechecked), and then you will be all up to date!

## 2015-04-23 NOTE — Progress Notes (Signed)
Chief Complaint  Patient presents with  . med check plus    med check plus- no breast or pelvic exam. wants to look at eat fat get thin diet plan. would like to get a new rx for epipen   April Matthews is a 69 y.o. female who presents for annual wellness visit and follow-up on chronic medical conditions.  She has the following concerns:  She is asking for refill of epi-pen.  She pulled out the one from her purse, and it is not yet expired.  She will call when new one is needed.  Hypertension: BP's have been running 120/60-70's when checked at other doctors and infusions, since being off the losartan (stopped due to low BP's with remicade infusions). No longer checking at home, because they were running similar to when checked elsewhere. She has felt so much better since stopping losartan (no longer feeling dizzy). She has been on atenolol for a very long time (she believes started for palpitations/irregular heartbeat, many years ago).  Diabetes: She has been on metformin for about 3 years for pre-diabetes. She no longer checks her blood sugars, as they were always "fine"--hasn't checked in a long time. Denies polydipsia, polyuria. Sees Dr. Kathrin Penner (ophtho) every December. She was recently seen by the retinal specialist in her office, and reports everything was okay.  She checks her feet regularly--denies numbness, tingling, pain or lesions.  She was changed to extended release metformin at her last visit in November. Denies any side effects.  Psoriatic arthritis: She is under the care of Dr. Amil Amen.  Controlled with Remicade. She is having flare in her feet, on her left side (ever since she had skin biopsy on her left arm).  Using some aleve and/or advil to help with the pain.  Remicade hasn't been as effective recently. At one point she had elevated LFT's, but they are now normal.  Osteopenia and h/o vitamin D deficiency. She is past due in getting DEXA, last done 2010.  She has not been  taking Calcium +D or a separate vitamin D recently, just sporadically. She gets 20 minutes of sun exposure (unprotected) to help with psoriasis, and advised by her dermatologist.  Elevated TSH in the past, asymptomatic--denies hair/skin/bowel/mood/energy changes. Antibodies had been negative, and last TSH was lower than it had been. Lab Results  Component Value Date   TSH 4.242 08/23/2014   Obesity: Dr. Dia Crawford Eat Fat Get Thin--she would like to start this 21 day program. Cuts carbs, sugars, and recommends taking supplements including fish oil, D3, L carnitine, CoQ10, Mg, PGX (fiber) ,probiotics, electrolytes, coconut oil and potato starch.  She reports it is a 21 day program at first, using these supplements.  She had lost weight in the past with very low carb diet, but ultimately regained it when re-introduced carbs into her diet.  R bruit --ultrasound last year showed no evidence of focal hemodynamically significant stenosis. She denies any neurologic symptoms  Immunization History  Administered Date(s) Administered  . Influenza Split 09/08/2012, 08/10/2014  . PPD Test 09/08/2012  . Pneumococcal Conjugate-13 04/20/2014  . Tdap 06/19/2011   she states she gets yearly flu shots (at Dr. Melissa Noon office) Shingles vaccine is contraindicated (live virus) Last Pap smear: 03/2014 Last mammogram: 01/2015 Last colonoscopy: 07/2014 diverticulosis Last DEXA: 02/2009--osteopenia Dentist: goes yearly Ophtho: goes yearly (December 2015), and saw retina specialist 2 weeks ago Exercise: recently opened pool, swimming 45 laps daily (plus aerobic/running in the pool, has her own pool)--about 1-1.5 hours of exercise  daily since she opened her pool 2 weeks ago. She has been having a flare of arthritis in her left foot.  She got 10,000 steps daily prior to opening pool (may not have had adequate cardio).  Other doctors caring for patient include: Dr. Amil Amen (rheum) Dr. Amy Martinique (derm) Dr.  Kathrin Penner (ophtho) No longer sees eye oncologist to follow a mole L eye--Dr. Kathrin Penner now follows. Dr. Oval Linsey (retinal specialist at [facility]) Dr. Sharlyn Bologna (dentist)  Depression screen:  See screening questionnaire in epic--normal. ADL screen:  See screening questionnaire.  Notable only for limitation in walking due to foot pain. Fall screen: negative.  End of Life Discussion:  Patient has a medical power of attorney (her daughter); unknown if she has Living Will.  Did not complete the paperwork given last year.  Past Medical History  Diagnosis Date  . DM (diabetes mellitus), gestational     with first pregnancy (stillborn)--based on autopsy; not diagnosed during pregnancy  . Seasonal allergies   . Heart murmur   . HTN (hypertension)   . Venous insufficiency   . Varicose veins   . Kidney stone 1997  . Abnormal mammogram 9/07    Right breast--bx sclerosing papilloma  . Osteopenia 7/07    mild  . Vitamin D deficiency 12/09  . Abnormal mammogram 02/2009    Left; suspicious abnormality L breast mammo and u/s.  Biopsy recommended but patient never did nor f/u mammo  . Psoriatic arthritis     on remicade (Dr. Amil Amen)  . Diabetes type 2, controlled     A1c 6.5 07/2014 (had been prediabetic prior)  . Abnormal TSH 2015    negative Ab's; borderline on repeat  . Diverticulosis 07/2014    mild, noted on colonoscopy    Past Surgical History  Procedure Laterality Date  . Cesarean section  1977  . Tonsillectomy and adenoidectomy    . Breast biopsy Right 9/07    right--sclerosing papilloma    History   Social History  . Marital Status: Married    Spouse Name: N/A  . Number of Children: N/A  . Years of Education: N/A   Occupational History  . reading specialist, works part-time    Social History Main Topics  . Smoking status: Never Smoker   . Smokeless tobacco: Never Used  . Alcohol Use: 0.0 oz/week    0 Standard drinks or equivalent per week     Comment:  occasional wine (twice a month)  . Drug Use: No  . Sexual Activity:    Partners: Male   Other Topics Concern  . Not on file   Social History Narrative   Lives with husband. Children live in [place], [place], [place], and [place], [place]    Family History  Problem Relation Age of Onset  . Stroke Mother   . Hypertension Mother   . Cancer Mother     SKIN CANCER  . GER disease Father   . Heart disease Brother   . Cancer Maternal Aunt     breast cancer late 35's  . Cancer Maternal Grandmother     breast cancer 60's  . Cancer Brother     prostate  . Colon cancer Neg Hx   . Rectal cancer Neg Hx   . Stomach cancer Neg Hx     Outpatient Encounter Prescriptions as of 04/23/2015  Medication Sig Note  . atenolol (TENORMIN) 25 MG tablet TAKE 1 TABLET BY MOUTH EVERY DAY   . EPINEPHrine (EPIPEN) 0.3 mg/0.3 mL DEVI Inject  0.3 mLs (0.3 mg total) into the muscle once. 04/23/2015: Food allergies (sesame seeds/oil) and bee stings  . inFLIXimab (REMICADE) 100 MG injection Inject 100 mg into the vein every 6 (six) weeks.   Marland Kitchen ipratropium (ATROVENT) 0.03 % nasal spray Place 2 sprays into the nose every 12 (twelve) hours.   . metFORMIN (GLUCOPHAGE-XR) 500 MG 24 hr tablet TAKE 1 TABLET (500 MG TOTAL) BY MOUTH DAILY WITH BREAKFAST.   . naproxen sodium (ANAPROX) 220 MG tablet Take 220 mg by mouth 2 (two) times daily with a meal. 04/23/2015: Uses prn joint pains (or Advil, whichever she has)  . [DISCONTINUED] Multiple Vitamins-Minerals (CENTRUM SILVER PO) Take 1 tablet by mouth daily.     . Calcium Citrate-Vitamin D (CALCIUM CITRATE + D PO) Take by mouth 2 (two) times daily. 1 tablespoon twice daily  04/23/2015: sporadic  . [DISCONTINUED] atenolol (TENORMIN) 25 MG tablet TAKE 1 TABLET BY MOUTH DAILY    No facility-administered encounter medications on file as of 04/23/2015.    Allergies  Allergen Reactions  . Sesame Oil Anaphylaxis    Sesame seed/oil  . Ace Inhibitors Cough  . Latex Rash   ROS: The patient  denies anorexia, fever, headaches, vision changes, hearing changes, ear pain, sore throat, breast concerns, chest pain, palpitations, dizziness, syncope, dyspnea on exertion, cough, swelling, nausea, vomiting, diarrhea, constipation, abdominal pain, melena, hematochezia, indigestion/heartburn, hematuria, incontinence, dysuria, vaginal bleeding, discharge, odor or itch, genital lesions, numbness, tingling, weakness, tremor, suspicious skin lesions, depression, anxiety, abnormal bleeding/bruising, or enlarged lymph nodes. Joint pains as per HPI, mainly in left foot. She denies any thyroid symptoms--no hair/skin/bowel changes, no mood changes, just the weight gain. 15# weight gain since her last visit in November. See HPI.  PHYSICAL EXAM:  BP 124/78 mmHg  Pulse 72  Ht 5' 3.5" (1.613 m)  Wt 221 lb 6.4 oz (100.426 kg)  BMI 38.60 kg/m2  General Appearance:   Alert, cooperative, no distress, appears stated age  Head:   Normocephalic, without obvious abnormality, atraumatic  Eyes:   PERRL, conjunctiva/corneas clear, EOM's intact, fundi   benign  Ears:   Normal TM's and external ear canals  Nose:  Nares normal, no purulent drainage or sinus tenderness. Nasal mucosa is normal. No sinus tenderness  Throat:  Lips, mucosa, and tongue normal; teeth and gums normal  Neck:  Supple, no lymphadenopathy; thyroid: no enlargement/tenderness/nodules; no JVD. +R carotid bruit noted, unchanged.  Back:  Spine nontender, no curvature, ROM normal, no CVA tenderness  Lungs:   Clear to auscultation bilaterally without wheezes, rales or ronchi; respirations unlabored  Chest Wall:   No tenderness or deformity  Heart:   Regular rate and rhythm, S1 and S2 normal, no rub  or gallop, 2/6 SEM  Breast Exam:   No tenderness, masses, or nipple discharge or inversion. No axillary lymphadenopathy  Abdomen:   Soft, non-tender, nondistended, normoactive bowel sounds,    no masses, no hepatosplenomegaly. Nontender, reducible umbilical hernia. WHSS midline lower abdomen.  Genitalia:   Normal external genitalia without lesions, +atrophic changes. BUS and vagina normal; no cervical motion tenderness. No abnormal vaginal discharge. Uterus and adnexa not enlarged, but exam is somewhat limited by her body habitus; adnexa nontender, no masses. Pap not performed.   Rectal:   Normal tone, no masses or tenderness; guaiac negative stool  Extremities:  No clubbing, cyanosis or edema  Pulses:  2+ and symmetric all extremities  Skin:  Skin turgor normal, no suspicious lesions. Various scattered erythematous lesions, plaques. Involvement  at her groin bilaterally as well.  Lymph nodes:  Cervical, supraclavicular, and axillary nodes normal  Neurologic:  CNII-XII intact, normal strength, sensation and gait; reflexes 2+ and symmetric throughout   Psych: Normal mood, affect, hygiene and grooming.       Normal diabetic foot exam.  Lab Results  Component Value Date   CHOL 154 04/20/2015   HDL 39* 04/20/2015   LDLCALC 80 04/20/2015   TRIG 176* 04/20/2015   CHOLHDL 3.9 04/20/2015   Lab Results  Component Value Date   WBC 7.1 04/20/2015   HGB 13.5 04/20/2015   HCT 40.4 04/20/2015   MCV 97.3 04/20/2015   PLT 311 04/20/2015     Chemistry      Component Value Date/Time   NA 141 04/20/2015 0001   K 4.7 04/20/2015 0001   CL 104 04/20/2015 0001   CO2 28 04/20/2015 0001   BUN 30* 04/20/2015 0001   CREATININE 1.03 04/20/2015 0001      Component Value Date/Time   CALCIUM 9.4 04/20/2015 0001   ALKPHOS 60 04/20/2015 0001   AST 16 04/20/2015 0001   ALT 15 04/20/2015 0001   BILITOT 0.4 04/20/2015 0001     Fasting glucose 109.  Lab Results  Component Value Date   HGBA1C 6.6* 04/20/2015   Urine microalbumin normal  ASSESSMENT/PLAN:  Medicare annual wellness visit, subsequent  Immunization due - Plan:  Pneumococcal polysaccharide vaccine 23-valent greater than or equal to 2yo subcutaneous/IM  Encounter for routine gynecological examination  Osteopenia - schedule DEXA. Discussed Ca and Vitamin D, weight-bearing exercise - Plan: DG Bone Density  Vitamin D deficiency - Plan: DG Bone Density  Postmenopausal estrogen deficiency - Plan: DG Bone Density  Bruit of right carotid artery - normal doppler exam last year. no need to repeat  Psoriasis - ongoing skin involvement. Per derm  Subclinical hypothyroidism - last TSH <5; other than weight gain, no symptoms. declines recheck today; recheck at 6 month visit  Essential hypertension, benign - controlled on low dose beta blocker.  urine microalbumin negative, so can hold off on restarting losartan (stopped due to low BP)  Type 2 diabetes mellitus, controlled - continue Metformin.  Discussed importance of daily exercise, weight loss  Psoriatic arthritis - inadequately controlled on Remicade currently. f/u with Dr. Amil Amen  Pure hypercholesterolemia - low HDL, elevated TG.  start fish oil; diet and exercise reviewed.  recheck in 6 mos.   Dyslipidemia--TG slightly high, HDL low.  Daily exercise (only recently restarted) and fish oil are recommended. Lowfat diet.  Okay for supplements short-term as requested for Dr. Dia Crawford diet.  Continue the fish oil, vitamin D long-term.  I discourage "diets", and encourage lifestyle modification that can be maintained long-term.  Very low carb diets can never be maintained, and weight gain recurs.  Discussed this in detail. She clearly is in great need of help with weight loss, and this 3 weeks may be a jumpstart, in addition to the regular exercise that she gets when she has access to her pool.  The challenge is the remaining 9 months of the year, and the need for maintaining regular exercise and diet during that time.  Discussed monthly self breast exams and yearly mammograms. Recommended at least 30  minutes of aerobic activity at least 5 days/week, weight-bearing exercise 2x/week; proper sunscreen use reviewed; healthy diet, including goals of calcium and vitamin D intake and alcohol recommendations (less than or equal to 1 drink/day) reviewed; regular seatbelt use; changing batteries in smoke  detectors. Immunization recommendations discussed--pneumovax today; yearly high dose flu shots recommended.  Colonoscopy is UTD. Past due for DEXA.  End of Life: Given paperwork again or Living Will and healthcare power of attorney (again) and asked to send [place] copies of completed forms. MOST form reviewed and updated--Full Code, full care  Labs due in 6 months include A1c, glucose, lipids, TSH I believe that Dr. Amil Amen monitors CBC and c-met regularly, so will hold off if done recently by him   Medicare Attestation I have personally reviewed: The patient's medical and social history Their use of alcohol, tobacco or illicit drugs Their current medications and supplements The patient's functional ability including ADLs,fall risks, home safety risks, cognitive, and hearing and visual impairment Diet and physical activities Evidence for depression or mood disorders  The patient's weight, height, BMI, and visual acuity have been recorded in the chart.  I have made referrals, counseling, and provided education to the patient based on review of the above and I have provided the patient with a written personalized care plan for preventive services.     Iyah Laguna A, MD   04/23/2015

## 2015-05-02 DIAGNOSIS — L4050 Arthropathic psoriasis, unspecified: Secondary | ICD-10-CM | POA: Diagnosis not present

## 2015-06-06 ENCOUNTER — Other Ambulatory Visit: Payer: Self-pay | Admitting: Family Medicine

## 2015-06-15 DIAGNOSIS — L4050 Arthropathic psoriasis, unspecified: Secondary | ICD-10-CM | POA: Diagnosis not present

## 2015-07-05 ENCOUNTER — Other Ambulatory Visit: Payer: Self-pay | Admitting: Family Medicine

## 2015-07-06 DIAGNOSIS — M25562 Pain in left knee: Secondary | ICD-10-CM | POA: Diagnosis not present

## 2015-07-06 DIAGNOSIS — L4050 Arthropathic psoriasis, unspecified: Secondary | ICD-10-CM | POA: Diagnosis not present

## 2015-07-06 DIAGNOSIS — R7989 Other specified abnormal findings of blood chemistry: Secondary | ICD-10-CM | POA: Diagnosis not present

## 2015-07-06 DIAGNOSIS — L401 Generalized pustular psoriasis: Secondary | ICD-10-CM | POA: Diagnosis not present

## 2015-07-09 DIAGNOSIS — M25562 Pain in left knee: Secondary | ICD-10-CM | POA: Diagnosis not present

## 2015-07-16 DIAGNOSIS — M25562 Pain in left knee: Secondary | ICD-10-CM | POA: Diagnosis not present

## 2015-07-24 DIAGNOSIS — M25562 Pain in left knee: Secondary | ICD-10-CM | POA: Diagnosis not present

## 2015-08-03 DIAGNOSIS — L4050 Arthropathic psoriasis, unspecified: Secondary | ICD-10-CM | POA: Diagnosis not present

## 2015-08-10 DIAGNOSIS — R7989 Other specified abnormal findings of blood chemistry: Secondary | ICD-10-CM | POA: Diagnosis not present

## 2015-08-10 DIAGNOSIS — L4050 Arthropathic psoriasis, unspecified: Secondary | ICD-10-CM | POA: Diagnosis not present

## 2015-08-10 DIAGNOSIS — M150 Primary generalized (osteo)arthritis: Secondary | ICD-10-CM | POA: Diagnosis not present

## 2015-08-10 DIAGNOSIS — L400 Psoriasis vulgaris: Secondary | ICD-10-CM | POA: Diagnosis not present

## 2015-08-10 DIAGNOSIS — M25562 Pain in left knee: Secondary | ICD-10-CM | POA: Diagnosis not present

## 2015-08-10 DIAGNOSIS — L401 Generalized pustular psoriasis: Secondary | ICD-10-CM | POA: Diagnosis not present

## 2015-08-23 DIAGNOSIS — Z23 Encounter for immunization: Secondary | ICD-10-CM | POA: Diagnosis not present

## 2015-08-24 DIAGNOSIS — L4050 Arthropathic psoriasis, unspecified: Secondary | ICD-10-CM | POA: Diagnosis not present

## 2015-09-02 ENCOUNTER — Other Ambulatory Visit: Payer: Self-pay | Admitting: Family Medicine

## 2015-09-07 DIAGNOSIS — L4050 Arthropathic psoriasis, unspecified: Secondary | ICD-10-CM | POA: Diagnosis not present

## 2015-09-28 DIAGNOSIS — L4050 Arthropathic psoriasis, unspecified: Secondary | ICD-10-CM | POA: Diagnosis not present

## 2015-10-05 DIAGNOSIS — E559 Vitamin D deficiency, unspecified: Secondary | ICD-10-CM | POA: Diagnosis not present

## 2015-10-05 DIAGNOSIS — L4050 Arthropathic psoriasis, unspecified: Secondary | ICD-10-CM | POA: Diagnosis not present

## 2015-10-05 DIAGNOSIS — R7989 Other specified abnormal findings of blood chemistry: Secondary | ICD-10-CM | POA: Diagnosis not present

## 2015-10-05 DIAGNOSIS — L401 Generalized pustular psoriasis: Secondary | ICD-10-CM | POA: Diagnosis not present

## 2015-10-05 DIAGNOSIS — M150 Primary generalized (osteo)arthritis: Secondary | ICD-10-CM | POA: Diagnosis not present

## 2015-10-12 ENCOUNTER — Encounter: Payer: Self-pay | Admitting: Family Medicine

## 2015-10-24 ENCOUNTER — Encounter: Payer: Medicare Other | Admitting: Family Medicine

## 2015-10-26 DIAGNOSIS — L4050 Arthropathic psoriasis, unspecified: Secondary | ICD-10-CM | POA: Diagnosis not present

## 2015-11-14 ENCOUNTER — Telehealth: Payer: Self-pay | Admitting: Family Medicine

## 2015-11-14 DIAGNOSIS — J302 Other seasonal allergic rhinitis: Secondary | ICD-10-CM

## 2015-11-14 NOTE — Telephone Encounter (Signed)
Called patient to find out name of new pharmacy and schedule med check appt.

## 2015-11-14 NOTE — Telephone Encounter (Signed)
Looks like she canceled her December med check, and hasn't rescheduled.  Okay to refill the atrovent, but needs to schedule med check

## 2015-11-14 NOTE — Telephone Encounter (Signed)
Pt's husband called and stated that [facility] has a NEW PHARMACY. Pt needs a refill of ipratropium sent to new pharmacy [address]. Pt can be reached at [phone].

## 2015-11-15 MED ORDER — IPRATROPIUM BROMIDE 0.03 % NA SOLN: 2.0000 | Freq: Two times a day (BID) | NASAL | Status: DC

## 2015-11-28 ENCOUNTER — Other Ambulatory Visit (HOSPITAL_COMMUNITY): Payer: Self-pay | Admitting: *Deleted

## 2015-11-29 ENCOUNTER — Ambulatory Visit (HOSPITAL_COMMUNITY)
Admission: RE | Admit: 2015-11-29 | Discharge: 2015-11-29 | Disposition: A | Discharge status: Home / Self Care | Payer: Medicare Other | Source: Ambulatory Visit | Attending: Internal Medicine | Admitting: Internal Medicine

## 2015-11-29 DIAGNOSIS — L4050 Arthropathic psoriasis, unspecified: Secondary | ICD-10-CM | POA: Diagnosis not present

## 2015-11-29 MED ORDER — USTEKINUMAB 90 MG/ML ~~LOC~~ SOSY
90.0000 mg | PREFILLED_SYRINGE | Freq: Once | SUBCUTANEOUS | Status: AC
  Administered 2015-11-29: 90 mg via SUBCUTANEOUS
  Filled 2015-11-29: qty 1

## 2015-12-04 ENCOUNTER — Other Ambulatory Visit: Payer: Self-pay | Admitting: Family Medicine

## 2015-12-17 ENCOUNTER — Ambulatory Visit (INDEPENDENT_AMBULATORY_CARE_PROVIDER_SITE_OTHER): Payer: Medicare Other | Admitting: Family Medicine

## 2015-12-17 ENCOUNTER — Other Ambulatory Visit: Payer: Self-pay | Admitting: Family Medicine

## 2015-12-17 ENCOUNTER — Encounter: Payer: Self-pay | Admitting: Family Medicine

## 2015-12-17 VITALS — BP 130/74 | HR 68 | Ht 63.5 in | Wt 227.6 lb

## 2015-12-17 DIAGNOSIS — E559 Vitamin D deficiency, unspecified: Secondary | ICD-10-CM

## 2015-12-17 DIAGNOSIS — E039 Hypothyroidism, unspecified: Secondary | ICD-10-CM

## 2015-12-17 DIAGNOSIS — E038 Other specified hypothyroidism: Secondary | ICD-10-CM

## 2015-12-17 DIAGNOSIS — I10 Essential (primary) hypertension: Secondary | ICD-10-CM

## 2015-12-17 DIAGNOSIS — I872 Venous insufficiency (chronic) (peripheral): Secondary | ICD-10-CM

## 2015-12-17 DIAGNOSIS — E785 Hyperlipidemia, unspecified: Secondary | ICD-10-CM

## 2015-12-17 DIAGNOSIS — R609 Edema, unspecified: Secondary | ICD-10-CM

## 2015-12-17 DIAGNOSIS — Z6835 Body mass index (BMI) 35.0-35.9, adult: Secondary | ICD-10-CM

## 2015-12-17 DIAGNOSIS — L4050 Arthropathic psoriasis, unspecified: Secondary | ICD-10-CM

## 2015-12-17 DIAGNOSIS — E7800 Pure hypercholesterolemia, unspecified: Secondary | ICD-10-CM

## 2015-12-17 DIAGNOSIS — Z91018 Allergy to other foods: Secondary | ICD-10-CM | POA: Diagnosis not present

## 2015-12-17 DIAGNOSIS — E669 Obesity, unspecified: Secondary | ICD-10-CM | POA: Insufficient documentation

## 2015-12-17 DIAGNOSIS — E119 Type 2 diabetes mellitus without complications: Secondary | ICD-10-CM

## 2015-12-17 DIAGNOSIS — R600 Localized edema: Secondary | ICD-10-CM

## 2015-12-17 DIAGNOSIS — Z1159 Encounter for screening for other viral diseases: Secondary | ICD-10-CM | POA: Diagnosis not present

## 2015-12-17 LAB — POCT GLYCOSYLATED HEMOGLOBIN (HGB A1C): HEMOGLOBIN A1C: 7.6

## 2015-12-17 MED ORDER — METFORMIN HCL ER 500 MG PO TB24: 1000.0000 mg | ORAL_TABLET | Freq: Every day | ORAL | Status: DC

## 2015-12-17 MED ORDER — EPINEPHRINE 0.3 MG/0.3ML IJ SOAJ: 0.3000 mg | Freq: Once | INTRAMUSCULAR | Status: DC

## 2015-12-17 NOTE — Patient Instructions (Signed)
  We are going to refer you to [facility] for evaluation. Please call [facility] and schedule your bone density (ordered in June). Start taking the Calcium +D supplement. Continue your 2000 IU of Vitamin D daily. Exercise when you can, but start working at eating healthy, small portions, and work on weight loss NOW. Your diabetes is no longer well controlled.  It is important to cut back on the carbs and sugars, work on weight loss. Increase your metformin to 2 daily (you can take them together; separate if you have stomach side effects). Take them with breakfast.

## 2015-12-17 NOTE — Progress Notes (Signed)
Chief Complaint  Patient presents with  . Hypertension    fasting med check. Asking for referral for stage 3 venous insufficinecy lymphadema.    She is asking to be referred to a vein specialist for venous insufficiency and lymphedema.  She was told to do this by her daughter who is a NP at the wound clinic. They have noticed discoloration medially at the ankles.  She has discomfort related to the joints, not the swelling. She has compression stockings, but stopped wearing them due to discomfort at the ankle when wearing them.  She can't recall, but thinks compression was 20-75m.  They helped with the swelling in the calves, but not the feet.  Hypertension: BP's are checked at other doctors, running 116-120's, a little higher since in pain and on advil. Losartan had been stopped due to low BP's with remicade infusions a while ago. No longer checking at home, because they were running similar to when checked elsewhere.  She has been on atenolol for a very long time (she believes started for palpitations/irregular heartbeat, many years ago).  Diabetes: She has been on metformin since age 89for pre-diabetes. She no longer checks her blood sugars, as they were always "fine"--hasn't checked in a long time. Denies polydipsia, polyuria. Sees Dr. SKathrin Penner(ophtho), last seen 10/2014.  Had to cancel her appt this year, and is rescheduled for 02/2016.  She checks her feet regularly--denies numbness, tingling, pain or lesions. +edema as per above. Denies any side effects from metformin, taking '500mg'$  of the XR daily. Last fasting glucose was 109 in June. A1c was 6.6.  Dyslipidemia:  Lab Results  Component Value Date   CHOL 154 04/20/2015   HDL 39* 04/20/2015   LDLCALC 80 04/20/2015   TRIG 176* 04/20/2015   CHOLHDL 3.9 04/20/2015  She tries to follow a lowfat, low cholesterol diet.  Hasn't been able to get regular exercise due to joint pain from psoriatic arthritis.  Psoriatic arthritis: She is  under the care of Dr. BAmil Amen She was changed from Remicaid to Cimzia (due to not being adequately controlled with Remicaid) in October. She had 3 doses, but it didn't work well either.  She switched to Stelara, and started with loading dose on 11/29/15.  Unable to get on her exercise bike due to having pain in her ankles,knees, toes. Taking more Advil than usual. August labs through his office showed CRP normal at 3.1, ESR 27. Normal CBC in 09/2015.  Osteopenia and h/o vitamin D deficiency. She is past due in getting DEXA, last done 2010.We discussed this and ordered at her visit in June, but she hasn't scheduled it yet. Vit D-OH was low at 23.0 through labs from Dr. BAmil Amenin 09/2015. She has been taking 2000 IU daily since getting those results.  Hadn't been taking regular vitamin D supplements prior to that. She is not currently taking a calcium supplement, but has them.  She gets 20 minutes of sun exposure (unprotected) to help with psoriasis, and advised by her dermatologist.  Elevated TSH in the past, asymptomatic--denies hair/skin/bowel/mood/energy changes. Antibodies had been negative. Lab Results  Component Value Date   TSH 4.242 08/23/2014    Obesity: At her last visit in June, she was going to start Dr. HDia CrawfordEat Fat Get Thin 21 day program--Cuts carbs, sugars, and recommends taking supplements including fish oil, D3, L carnitine, CoQ10, Mg, PGX (fiber) ,probiotics, electrolytes, coconut oil and potato starch. She reports it is a 21 day program at first, using these  supplements.She never started this program, too busy.  Hopes to start after she retires in 3 months.  She has gained 6# since her last visit, up over 20# in the last year.  PMH, PSH, SH and FH reviewed/updated. She plans to retire in 3 months.  Outpatient Encounter Prescriptions as of 12/17/2015  Medication Sig Note  . atenolol (TENORMIN) 25 MG tablet TAKE 1 TABLET BY MOUTH EVERY DAY   . ibuprofen (ADVIL,MOTRIN) 200  MG tablet Take 400 mg by mouth every 6 (six) hours as needed.   Marland Kitchen ipratropium (ATROVENT) 0.03 % nasal spray Place 2 sprays into the nose every 12 (twelve) hours.   . metFORMIN (GLUCOPHAGE-XR) 500 MG 24 hr tablet Take 2 tablets (1,000 mg total) by mouth daily with breakfast.   . ustekinumab (STELARA) 45 MG/0.5ML SOSY injection Inject 45 mg into the skin. 12/17/2015: Next dose March 3,2017 then q 12 weeks.   . [DISCONTINUED] metFORMIN (GLUCOPHAGE-XR) 500 MG 24 hr tablet TAKE 1 TABLET (500 MG TOTAL) BY MOUTH DAILY WITH BREAKFAST.   . [DISCONTINUED] naproxen sodium (ANAPROX) 220 MG tablet Take 220 mg by mouth 2 (two) times daily with a meal. 04/23/2015: Uses prn joint pains (or Advil, whichever she has)  . Calcium Citrate-Vitamin D (CALCIUM CITRATE + D PO) Take by mouth 2 (two) times daily. Reported on 12/17/2015 04/23/2015: sporadic  . clobetasol cream (TEMOVATE) 0.05 % Reported on 12/17/2015 12/17/2015: Received from: External Pharmacy  . EPINEPHrine (EPIPEN 2-PAK) 0.3 mg/0.3 mL IJ SOAJ injection Inject 0.3 mLs (0.3 mg total) into the muscle once.   . [DISCONTINUED] atenolol (TENORMIN) 25 MG tablet TAKE 1 TABLET BY MOUTH DAILY   . [DISCONTINUED] atenolol (TENORMIN) 25 MG tablet TAKE 1 TABLET BY MOUTH EVERY DAY   . [DISCONTINUED] EPINEPHrine (EPIPEN) 0.3 mg/0.3 mL DEVI Inject 0.3 mLs (0.3 mg total) into the muscle once. (Patient not taking: Reported on 12/17/2015) 04/23/2015: Food allergies (sesame seeds/oil) and bee stings  . [DISCONTINUED] inFLIXimab (REMICADE) 100 MG injection Inject 100 mg into the vein every 6 (six) weeks.    No facility-administered encounter medications on file as of 12/17/2015.   Metformin dose was '500mg'$  prior to today's visit.  Allergies  Allergen Reactions  . Sesame Oil Anaphylaxis    Sesame seed/oil  . Ace Inhibitors Cough  . Latex Rash    ROS:  No fever, chills, URI symptoms, cough, shortness of breath, chest pain, hair/skin/bowel changes. +edema per HPI.  No nausea,  vomiting, bowel changes, bleeding, bruising, rash, depression, anxiety. +joint pains. See HPI.  PHYSICAL EXAM: BP 130/74 mmHg  Pulse 68  Ht 5' 3.5" (1.613 m)  Wt 227 lb 9.6 oz (103.239 kg)  BMI 39.68 kg/m2 Well developed, pleasant, obese female in no distress HEENT: PERRL, EOMI, conjunctiva and sclera are clear, OP clear Neck: no lymphadenopathy thyromegaly or bruit Heart: regular rate and rhythm without murmur, rub, gallop Lungs: clear bilaterally Abdomen: obese, soft, nontender, no organomegaly or mass Extremities: 1+ edema, pitting. Skin: Hyperpigmentation anteriorly at lower shins, some discoloration (violaceous) at left medial ankle 2+ pulses Neuro: alert and oriented. Cranial nerves intact. Normal strength, gait Psych: normal mood, affect, hygiene and grooming   Lab Results  Component Value Date   HGBA1C 7.6 12/17/2015   ASSESSMENT/PLAN:  Controlled type 2 diabetes mellitus without complication, without long-term current use of insulin (HCC) - no longer well controlled; increase metformin to '1000mg'$  daily. diet, weight loss, exercise discussed. - Plan: HgB A1c, Lipid panel, Comprehensive metabolic panel, metFORMIN (GLUCOPHAGE-XR) 500 MG  24 hr tablet  Essential hypertension, benign - controlled - Plan: Comprehensive metabolic panel  Vitamin D deficiency - continue OTC supplementation of 2000 IU. recheck at next visit (or per rheum). Calcium recommendations also reviewed  Psoriatic arthritis ([place]) - recently changed therapy to Stelara  Pure hypercholesterolemia  Subclinical hypothyroidism - Plan: TSH  Chronic venous insufficiency - refer to [facility] for eal - Plan: Ambulatory referral to Vascular Surgery  Peripheral edema - compression stockings, leg elevation, exercise when able, low sodium diet, vascular consult - Plan: Ambulatory referral to Vascular Surgery  Dyslipidemia - Plan: Lipid panel  Food allergy - may need to change to generic epinephrine  auto-injector if not covered. - Plan: EPINEPHrine (EPIPEN 2-PAK) 0.3 mg/0.3 mL IJ SOAJ injection  Severe obesity (BMI 35.0-39.9) with comorbidity (HCC)   c-met, lipid, TSH Hep C Ab    We are going to refer you to [facility] for evaluation. [facility] and schedule your bone density (ordered in June). Start taking the Calcium +D supplement. Continue your 2000 IU of Vitamin D daily. Exercise when you can, but start working at eating healthy, small portions, and work on weight loss NOW. Your diabetes is no longer well controlled.  It is important to cut back on the carbs and sugars, work on weight loss. Increase your metformin to 2 daily (you can take them together; separate if you have stomach side effects). Take them with breakfast.

## 2015-12-18 LAB — LIPID PANEL
CHOL/HDL RATIO: 4.5 ratio (ref <=5.0)
Cholesterol: 187 mg/dL (ref 125–200)
HDL: 42 mg/dL — ABNORMAL LOW (ref >=46)
LDL CALC: 101 mg/dL (ref <130)
Triglycerides: 218 mg/dL — ABNORMAL HIGH (ref <150)
VLDL: 44 mg/dL — AB (ref <30)

## 2015-12-18 LAB — COMPREHENSIVE METABOLIC PANEL
ALBUMIN: 4 g/dL (ref 3.6–5.1)
ALK PHOS: 67 U/L (ref 33–130)
ALT: 14 U/L (ref 6–29)
AST: 16 U/L (ref 10–35)
BILIRUBIN TOTAL: 0.5 mg/dL (ref 0.2–1.2)
BUN: 23 mg/dL (ref 7–25)
CO2: 27 mmol/L (ref 20–31)
CREATININE: 1.07 mg/dL — AB (ref 0.50–0.99)
Calcium: 9.5 mg/dL (ref 8.6–10.4)
Chloride: 103 mmol/L (ref 98–110)
Glucose, Bld: 145 mg/dL — ABNORMAL HIGH (ref 65–99)
Potassium: 4.6 mmol/L (ref 3.5–5.3)
SODIUM: 141 mmol/L (ref 135–146)
TOTAL PROTEIN: 7.5 g/dL (ref 6.1–8.1)

## 2015-12-18 LAB — HEPATITIS C ANTIBODY: HCV AB: NEGATIVE

## 2015-12-18 LAB — TSH: TSH: 2.85 m[IU]/L

## 2015-12-18 NOTE — Addendum Note (Signed)
Addended by: Minette Headland A on: 12/18/2015 10:02 AM   Modules accepted: Miquel Dunn

## 2015-12-19 ENCOUNTER — Telehealth: Payer: Self-pay | Admitting: *Deleted

## 2015-12-19 NOTE — Telephone Encounter (Signed)
Patient is scheduled with Dr.Featherstone @ [facility] 12/24/15 @ 2:30pm telephone [place]

## 2015-12-28 ENCOUNTER — Ambulatory Visit (HOSPITAL_COMMUNITY)
Admission: RE | Admit: 2015-12-28 | Discharge: 2015-12-28 | Disposition: A | Discharge status: Home / Self Care | Payer: Medicare Other | Source: Ambulatory Visit | Attending: Internal Medicine | Admitting: Internal Medicine

## 2015-12-28 DIAGNOSIS — H2513 Age-related nuclear cataract, bilateral: Secondary | ICD-10-CM | POA: Diagnosis not present

## 2015-12-28 DIAGNOSIS — E119 Type 2 diabetes mellitus without complications: Secondary | ICD-10-CM | POA: Diagnosis not present

## 2015-12-28 DIAGNOSIS — L4050 Arthropathic psoriasis, unspecified: Secondary | ICD-10-CM | POA: Diagnosis not present

## 2015-12-28 DIAGNOSIS — D3132 Benign neoplasm of left choroid: Secondary | ICD-10-CM | POA: Diagnosis not present

## 2015-12-28 DIAGNOSIS — H524 Presbyopia: Secondary | ICD-10-CM | POA: Diagnosis not present

## 2015-12-28 LAB — HM DIABETES EYE EXAM

## 2015-12-28 MED ORDER — USTEKINUMAB 90 MG/ML ~~LOC~~ SOSY
90.0000 mg | PREFILLED_SYRINGE | Freq: Once | SUBCUTANEOUS | Status: AC
  Administered 2015-12-28: 90 mg via SUBCUTANEOUS
  Filled 2015-12-28: qty 1

## 2016-01-10 ENCOUNTER — Encounter: Payer: Self-pay | Admitting: *Deleted

## 2016-01-11 DIAGNOSIS — E559 Vitamin D deficiency, unspecified: Secondary | ICD-10-CM | POA: Diagnosis not present

## 2016-01-11 DIAGNOSIS — R7989 Other specified abnormal findings of blood chemistry: Secondary | ICD-10-CM | POA: Diagnosis not present

## 2016-01-11 DIAGNOSIS — L4050 Arthropathic psoriasis, unspecified: Secondary | ICD-10-CM | POA: Diagnosis not present

## 2016-01-11 DIAGNOSIS — L401 Generalized pustular psoriasis: Secondary | ICD-10-CM | POA: Diagnosis not present

## 2016-01-11 DIAGNOSIS — M150 Primary generalized (osteo)arthritis: Secondary | ICD-10-CM | POA: Diagnosis not present

## 2016-02-22 DIAGNOSIS — I872 Venous insufficiency (chronic) (peripheral): Secondary | ICD-10-CM | POA: Diagnosis not present

## 2016-02-22 DIAGNOSIS — L400 Psoriasis vulgaris: Secondary | ICD-10-CM | POA: Diagnosis not present

## 2016-02-22 DIAGNOSIS — D1801 Hemangioma of skin and subcutaneous tissue: Secondary | ICD-10-CM | POA: Diagnosis not present

## 2016-02-22 DIAGNOSIS — I8312 Varicose veins of left lower extremity with inflammation: Secondary | ICD-10-CM | POA: Diagnosis not present

## 2016-02-22 DIAGNOSIS — I8311 Varicose veins of right lower extremity with inflammation: Secondary | ICD-10-CM | POA: Diagnosis not present

## 2016-02-22 DIAGNOSIS — I788 Other diseases of capillaries: Secondary | ICD-10-CM | POA: Diagnosis not present

## 2016-02-22 DIAGNOSIS — L304 Erythema intertrigo: Secondary | ICD-10-CM | POA: Diagnosis not present

## 2016-03-02 ENCOUNTER — Other Ambulatory Visit: Payer: Self-pay | Admitting: Family Medicine

## 2016-03-05 ENCOUNTER — Other Ambulatory Visit: Payer: Self-pay | Admitting: Family Medicine

## 2016-03-21 ENCOUNTER — Encounter (HOSPITAL_COMMUNITY): Payer: Medicare Other

## 2016-03-21 DIAGNOSIS — L4050 Arthropathic psoriasis, unspecified: Secondary | ICD-10-CM | POA: Diagnosis not present

## 2016-04-01 ENCOUNTER — Telehealth: Payer: Self-pay | Admitting: Family Medicine

## 2016-04-01 NOTE — Telephone Encounter (Signed)
I am aware of the data and recommendation.  Pt has appt later this month to f/u on her lipids and discuss

## 2016-04-01 NOTE — Telephone Encounter (Signed)
[facility] office called to encourage Dr Tomi Bamberger to place pt on statin therapy because rep says research shows that placing pt's such as Ms Delahoussaye on moderate to high intensity statin therapy can decrease mortality. Any questions can be directed to pt's local [facility] @ [phone]

## 2016-04-09 ENCOUNTER — Ambulatory Visit (INDEPENDENT_AMBULATORY_CARE_PROVIDER_SITE_OTHER): Payer: Medicare Other | Admitting: Family Medicine

## 2016-04-09 ENCOUNTER — Encounter: Payer: Self-pay | Admitting: Family Medicine

## 2016-04-09 VITALS — BP 128/72 | HR 76 | Ht 63.5 in | Wt 217.2 lb

## 2016-04-09 DIAGNOSIS — I10 Essential (primary) hypertension: Secondary | ICD-10-CM

## 2016-04-09 DIAGNOSIS — E559 Vitamin D deficiency, unspecified: Secondary | ICD-10-CM | POA: Diagnosis not present

## 2016-04-09 DIAGNOSIS — E7800 Pure hypercholesterolemia, unspecified: Secondary | ICD-10-CM

## 2016-04-09 DIAGNOSIS — E119 Type 2 diabetes mellitus without complications: Secondary | ICD-10-CM

## 2016-04-09 LAB — POCT GLYCOSYLATED HEMOGLOBIN (HGB A1C): Hemoglobin A1C: 6.7

## 2016-04-09 MED ORDER — ATORVASTATIN CALCIUM 20 MG PO TABS: 20.0000 mg | ORAL_TABLET | Freq: Every day | ORAL | Status: DC

## 2016-04-09 NOTE — Progress Notes (Signed)
Chief Complaint  Patient presents with  . Hypertension    nonfasting med check.    Patient presents to follow up on diabetes.  Her metformin was increased to 1000mg  daily at her visit in May, after A1c rose to 7.6.  (she had been on metformin once daily since age 70 for pre-diabetes). She does not check sugars elsewhere.  She denies side effects.  She has been losing a pound a week.  Started swimming this week. Cut back on portions (always eats healthy) She retired. Less stress She lost 10# since her last visit 4 months ago.  Hypertension: BP's are checked at other doctors, has been normal.  Losartan had been stopped due to low BP's with remicade infusions a while ago. No longer checking at home, because they were running similar to when checked elsewhere. She has been on atenolol for a very long time (she believes started for palpitations/irregular heartbeat, many years ago).  Peripheral edema, chronic venous insufficiency--she was referred to [facility] at her last visit. Appointment was canceled by them, and she needs to reschedule. Ankle swelling has improved significantly recently.  Vitamin D deficiency.  This was last checked by Dr. Amil Amen in 09/2015 and level was 23. She has been taking a daily supplement since then--unsure if 1000 or 2000 IU daily. She gets sun exposure to help with her psoriasis.  Psoriatic arthritis--She has been on Stelara since January 2017 (didn't do well on Cimzia), dose was doubled at her last injection recently.  Still has skin manifestations, but joint pains have improved (suppressed, but not gone, worse with rainy weather). She has chronic left knee pain, has seen orthopedist, and will likely need arthroscopic surgery.  Hyperlipidemia:  She has never been on medication for this, never taken statins. Lab Results  Component Value Date   CHOL 187 12/17/2015   HDL 42* 12/17/2015   LDLCALC 101 12/17/2015   TRIG 218* 12/17/2015   CHOLHDL 4.5 12/17/2015     PMH, PSH, SH reviewed/updated  Outpatient Encounter Prescriptions as of 04/09/2016  Medication Sig Note  . atenolol (TENORMIN) 25 MG tablet TAKE 1 TABLET BY MOUTH EVERY DAY   . Calcium Carb-Cholecalciferol (LIQUID CALCIUM WITH D3 PO) Take 15 mLs by mouth 2 (two) times daily.   . cholecalciferol (VITAMIN D) 1000 units tablet Take 1,000 Units by mouth daily.   . clobetasol (TEMOVATE) 0.05 % external solution APPLY TO THE SKIN BID 04/09/2016: Received from: External Pharmacy  . ipratropium (ATROVENT) 0.03 % nasal spray USE 2 SPRAYS IN EACH NOSTRIL EVERY 12 HOURS   . metFORMIN (GLUCOPHAGE-XR) 500 MG 24 hr tablet Take 2 tablets (1,000 mg total) by mouth daily with breakfast.   . ustekinumab (STELARA) 45 MG/0.5ML SOSY injection Inject 45 mg into the skin. 12/17/2015: Next dose March 3,2017 then q 12 weeks.   Marland Kitchen atorvastatin (LIPITOR) 20 MG tablet Take 1 tablet (20 mg total) by mouth daily.   Marland Kitchen EPINEPHrine (EPIPEN 2-PAK) 0.3 mg/0.3 mL IJ SOAJ injection Inject 0.3 mLs (0.3 mg total) into the muscle once. (Patient not taking: Reported on 04/09/2016)   . [DISCONTINUED] atenolol (TENORMIN) 25 MG tablet TAKE 1 TABLET BY MOUTH EVERY DAY   . [DISCONTINUED] Calcium Citrate-Vitamin D (CALCIUM CITRATE + D PO) Take by mouth 2 (two) times daily. Reported on 12/17/2015 04/23/2015: sporadic  . [DISCONTINUED] clobetasol cream (TEMOVATE) 0.05 % Reported on 12/17/2015 12/17/2015: Received from: External Pharmacy  . [DISCONTINUED] ibuprofen (ADVIL,MOTRIN) 200 MG tablet Take 400 mg by mouth every 6 (six)  hours as needed.    No facility-administered encounter medications on file as of 04/09/2016.   (atorvastatin started today, no taking prior to visit).  Allergies  Allergen Reactions  . Sesame Oil Anaphylaxis    Sesame seed/oil  . Ace Inhibitors Cough  . Latex Rash   ROS: no fever, chills, URI symptoms, headaches, dizziness, URI symptoms, cough, shortness of breath, chest pain, GI or GU complaints.  +psoriasis skin  manifestation and joint pains as per HPI.  Denies depression, anxiety, bleeding, bruising, or other complaints. See HPI  PHYSICAL EXAM: BP 128/72 mmHg  Pulse 76  Ht 5' 3.5" (1.613 m)  Wt 217 lb 3.2 oz (98.521 kg)  BMI 37.87 kg/m2 Well developed, pleasant, obese female in no distress HEENT: PERRL, EOMI, conjunctiva and sclera are clear. TM's clear, OP clear Neck :no lymphadenopathy, thyromegaly or mass Heart: regular rate and rhythm Lungs: clear bilaterally Abdomen: soft, nontender, no organomegaly or mass Extremities: no edema, 2+ pulses Normal diabetic foot exam Psych: normal mood, affect, hygiene and grooming Neuro: alert and oriented, cranial nerves intact, normal gait   Lab Results  Component Value Date   HGBA1C 6.7 04/09/2016    ASSESSMENT/PLAN:  Diabetes mellitus without complication ([place]) - improved control; continue metformin 1000mg  daily, weight loss, exercise - Plan: HgB A1c, Microalbumin / creatinine urine ratio, atorvastatin (LIPITOR) 20 MG tablet  Controlled type 2 diabetes mellitus without complication, without long-term current use of insulin ([place])  Pure hypercholesterolemia - start atorvastatin for primary prevention. risks/side effects reviewed in detail - Plan: atorvastatin (LIPITOR) 20 MG tablet  Essential hypertension, benign - well controlled  Vitamin D deficiency - Plan: VITAMIN D 25 Hydroxy (Vit-D Deficiency, Fractures)    Urine microalbumin and Vitamin D level today Start statin for primary prevention  Atorvastatin 20mg  If not tolerating despite trial of Coenzyme Q10, change to pravastatin.  Risks/side effects reviewed in detail.  Advised to schedule her DEXA (ordered last year)  3 months--f/u

## 2016-04-09 NOTE — Patient Instructions (Signed)
Start the atorvastatin once daily (at bedtime--if too hard to remember, okay to take in the morning). If you develop any muscles aches or side effects, start taking over-the-counter coenzyme Q10 daily along with it. If you still are having side effects that are not acceptable, call to have the cholesterol medication changed (pravastatin)  Continue your healthy diet, regular exercise and weight loss. Continue the 1000mg  of metformin daily.

## 2016-04-10 DIAGNOSIS — Z79899 Other long term (current) drug therapy: Secondary | ICD-10-CM | POA: Diagnosis not present

## 2016-04-10 DIAGNOSIS — L4050 Arthropathic psoriasis, unspecified: Secondary | ICD-10-CM | POA: Diagnosis not present

## 2016-04-10 DIAGNOSIS — M150 Primary generalized (osteo)arthritis: Secondary | ICD-10-CM | POA: Diagnosis not present

## 2016-04-10 DIAGNOSIS — E559 Vitamin D deficiency, unspecified: Secondary | ICD-10-CM | POA: Diagnosis not present

## 2016-04-10 DIAGNOSIS — R7989 Other specified abnormal findings of blood chemistry: Secondary | ICD-10-CM | POA: Diagnosis not present

## 2016-04-10 DIAGNOSIS — L401 Generalized pustular psoriasis: Secondary | ICD-10-CM | POA: Diagnosis not present

## 2016-04-10 LAB — VITAMIN D 25 HYDROXY (VIT D DEFICIENCY, FRACTURES): VIT D 25 HYDROXY: 48 ng/mL (ref 30–100)

## 2016-04-11 LAB — MICROALBUMIN / CREATININE URINE RATIO
CREATININE, URINE: 205 mg/dL (ref 20–320)
MICROALB UR: 1.2 mg/dL
Microalb Creat Ratio: 6 mcg/mg creat (ref <30)

## 2016-05-27 ENCOUNTER — Other Ambulatory Visit: Payer: Self-pay | Admitting: Family Medicine

## 2016-06-02 ENCOUNTER — Telehealth: Payer: Self-pay | Admitting: Family Medicine

## 2016-06-02 NOTE — Telephone Encounter (Signed)
Pt called and stated the she is going to have some dental work done. She has a broken tooth that she will be having a crown for. Pt states that up until 5 years ago she needed medication before cleanings. When she mentioned this to her new dentist she was informed to call her PCP and see what needed to be done. Please advise pt. She can be reached on her husband's cell phone as they are out of town. [phone]. Pt's appt with dentist is next Monday and [facility] [address].

## 2016-06-04 NOTE — Telephone Encounter (Signed)
Left message for pt to call me back 

## 2016-06-04 NOTE — Telephone Encounter (Signed)
I reviewed patient's chart.  She is noted to have a slight murmur on exam.  I do not have any record of echocardiogram (ultrasound of heart to look at the heart valves or cause of the murmur), so I can't really say for sure if an antibiotic is needed.  The guidelines definitely changed in recent years, and antibiotics are prescribed less than in the past.  Without having an echo, knowing that she hasn't been using ABX for cleanings, but is having more extensive dental work done than a cleaning, I think it would be most prudent to cover with ABX.  The dentists usually prescribe the antibiotics for the patients. Please advise pt

## 2016-06-13 DIAGNOSIS — L4050 Arthropathic psoriasis, unspecified: Secondary | ICD-10-CM | POA: Diagnosis not present

## 2016-07-08 NOTE — Progress Notes (Signed)
Chief Complaint  Patient presents with  . Diabetes    fasting med check. No concerns.    Patient presents to follow up on hyperlipidemia.  She was started on atorvastatin 3 months ago.  Lipids prior to starting were: Lab Results  Component Value Date   CHOL 187 12/17/2015   HDL 42 (L) 12/17/2015   LDLCALC 101 12/17/2015   TRIG 218 (H) 12/17/2015   CHOLHDL 4.5 12/17/2015   She had some stomach upset at first, but since changing it to night-time dosing, hasn't had any problems. Denies myalgias or other side effects.  Hypertension: BP's are checked at other doctors, has been normal.  Losartan had been stopped due to low BP's with remicade infusions a while ago. No longer checking at home, because they were running similar to when checked elsewhere. She has been on atenolol for a very long time (she believes started for palpitations/irregular heartbeat, many years ago). No headaches, dizziness, chest pain.  Diabetes:  She doesn't check blood sugars.   Her metformin was increased to 1000mg  daily at her visit in February, after A1c rose to 7.6. She denies side effects. A1c in June was much improved. Lab Results  Component Value Date   HGBA1C 6.7 04/09/2016   She has continued to lose weight--down an additional pound from 3 months ago. She had lost more, but gained some on vacation, parties. Husband has been bringing home sweets, donuts.  She is mad at herself, because she reports being down to 208.  She will be closing the pool in the next 3 weeks.  She has a new recumbent bicycle, but it caused her knee pain when she tried it, so hasn't used it. She is planning to have knee surgery at some point.  PMH, PSH, SH reviewed  Outpatient Encounter Prescriptions as of 07/10/2016  Medication Sig Note  . atenolol (TENORMIN) 25 MG tablet TAKE 1 TABLET BY MOUTH EVERY DAY   . atorvastatin (LIPITOR) 20 MG tablet Take 1 tablet (20 mg total) by mouth daily.   . Calcium Carb-Cholecalciferol (LIQUID  CALCIUM WITH D3 PO) Take 15 mLs by mouth 2 (two) times daily. 07/10/2016: About 3 times per week  . CHIA SEED PO Take 15 mLs by mouth daily.   . cholecalciferol (VITAMIN D) 1000 units tablet Take 1,000 Units by mouth daily.   . clobetasol (TEMOVATE) 0.05 % external solution APPLY TO THE SKIN BID 04/09/2016: Received from: External Pharmacy  . ipratropium (ATROVENT) 0.03 % nasal spray USE 2 SPRAYS IN EACH NOSTRIL EVERY 12 HOURS   . loratadine (CLARITIN) 10 MG tablet Take 10 mg by mouth daily.   . metFORMIN (GLUCOPHAGE-XR) 500 MG 24 hr tablet Take 3 tablets (1,500 mg total) by mouth daily with breakfast.   . ustekinumab (STELARA) 45 MG/0.5ML SOSY injection Inject 45 mg into the skin. 12/17/2015: Next dose March 3,2017 then q 12 weeks.   . [DISCONTINUED] metFORMIN (GLUCOPHAGE-XR) 500 MG 24 hr tablet Take 2 tablets (1,000 mg total) by mouth daily with breakfast.   . EPINEPHrine (EPIPEN 2-PAK) 0.3 mg/0.3 mL IJ SOAJ injection Inject 0.3 mLs (0.3 mg total) into the muscle once. (Patient not taking: Reported on 07/10/2016)    No facility-administered encounter medications on file as of 07/10/2016.    Allergies  Allergen Reactions  . Sesame Oil Anaphylaxis    Sesame seed/oil  . Ace Inhibitors Cough  . Latex Rash   ROS:  No fever, chills, URI symptoms, headaches, dizziness, chest pain, palpitations, shortness of breath,  nausea, vomiting, heartburn, bowel changes, bleeding, bruising, rash, depression anxiety or other concerns. +knee pain.  See HPI.  PHYSICAL EXAM: BP 130/80 (BP Location: Left Arm, Patient Position: Sitting, Cuff Size: Normal)   Pulse 68   Ht 5' 3.5" (1.613 m)   Wt 216 lb 3.2 oz (98.1 kg)   BMI 37.70 kg/m   Well developed, pleasant, obese female in no distress HEENT: EOMI, conjunctiva and sclera are clear.  Neck :no lymphadenopathy, thyromegaly or mass Heart: regular rate and rhythm Lungs: clear bilaterally Abdomen: soft, nontender, no organomegaly or mass Extremities: no edema,  2+ pulses Psych: normal mood, affect, hygiene and grooming Neuro: alert and oriented, cranial nerves intact, normal gait  Lab Results  Component Value Date   HGBA1C 7.0 07/10/2016   ASSESSMENT/PLAN:  Pure hypercholesterolemia - tolerating atorvastatin; due for recheck - Plan: Comprehensive metabolic panel, Lipid panel  Essential hypertension, benign - controlled - Plan: Comprehensive metabolic panel  Controlled type 2 diabetes mellitus without complication, without long-term current use of insulin (HCC) - A1c increased from 6.7-7. Increase metformin to 1500mg  daily; counseled re: diet, exercise, wt loss - Plan: HgB A1c, metFORMIN (GLUCOPHAGE-XR) 500 MG 24 hr tablet  Need for prophylactic vaccination and inoculation against influenza - Plan: Flu vaccine HIGH DOSE PF (Fluzone High dose)   Discussed plan for exercise when pool closes for the season.  Discussed recumbent bike--starting very short intervals, no/low tension, and gradually increase as tolerated.  Increase the metformin to 1500mg  daily--you can take all 3 tablets at the same time, but if it bothers your stomach (loose stools), feel free to separate them, taking 2 in the morning, 1 at dinner.

## 2016-07-09 DIAGNOSIS — E119 Type 2 diabetes mellitus without complications: Secondary | ICD-10-CM | POA: Diagnosis not present

## 2016-07-09 DIAGNOSIS — D3132 Benign neoplasm of left choroid: Secondary | ICD-10-CM | POA: Diagnosis not present

## 2016-07-09 LAB — HM DIABETES EYE EXAM

## 2016-07-10 ENCOUNTER — Ambulatory Visit (INDEPENDENT_AMBULATORY_CARE_PROVIDER_SITE_OTHER): Payer: Medicare Other | Admitting: Family Medicine

## 2016-07-10 VITALS — BP 130/80 | HR 68 | Ht 63.5 in | Wt 216.2 lb

## 2016-07-10 DIAGNOSIS — I10 Essential (primary) hypertension: Secondary | ICD-10-CM

## 2016-07-10 DIAGNOSIS — E119 Type 2 diabetes mellitus without complications: Secondary | ICD-10-CM

## 2016-07-10 DIAGNOSIS — Z23 Encounter for immunization: Secondary | ICD-10-CM

## 2016-07-10 DIAGNOSIS — E7800 Pure hypercholesterolemia, unspecified: Secondary | ICD-10-CM | POA: Diagnosis not present

## 2016-07-10 LAB — COMPREHENSIVE METABOLIC PANEL
ALK PHOS: 64 U/L (ref 33–130)
ALT: 13 U/L (ref 6–29)
AST: 14 U/L (ref 10–35)
Albumin: 4.1 g/dL (ref 3.6–5.1)
BILIRUBIN TOTAL: 0.5 mg/dL (ref 0.2–1.2)
BUN: 21 mg/dL (ref 7–25)
CO2: 27 mmol/L (ref 20–31)
Calcium: 9.6 mg/dL (ref 8.6–10.4)
Chloride: 106 mmol/L (ref 98–110)
Creat: 1.1 mg/dL — ABNORMAL HIGH (ref 0.60–0.93)
GLUCOSE: 145 mg/dL — AB (ref 65–99)
POTASSIUM: 4.6 mmol/L (ref 3.5–5.3)
Sodium: 141 mmol/L (ref 135–146)
TOTAL PROTEIN: 7 g/dL (ref 6.1–8.1)

## 2016-07-10 LAB — LIPID PANEL
CHOL/HDL RATIO: 2.6 ratio (ref <=5.0)
Cholesterol: 122 mg/dL — ABNORMAL LOW (ref 125–200)
HDL: 47 mg/dL (ref >=46)
LDL CALC: 47 mg/dL (ref <130)
TRIGLYCERIDES: 139 mg/dL (ref <150)
VLDL: 28 mg/dL (ref <30)

## 2016-07-10 LAB — POCT GLYCOSYLATED HEMOGLOBIN (HGB A1C): HEMOGLOBIN A1C: 7

## 2016-07-10 MED ORDER — METFORMIN HCL ER 500 MG PO TB24: 1500.0000 mg | ORAL_TABLET | Freq: Every day | ORAL | 1 refills | Status: DC

## 2016-07-10 NOTE — Patient Instructions (Signed)
  Discussed plan for exercise when pool closes for the season.  Discussed recumbent bike--starting very short intervals, no/low tension, and gradually increase as tolerated.  Increase the metformin to 1500mg  daily--you can take all 3 tablets at the same time, but if it bothers your stomach (loose stools), feel free to separate them, taking 2 in the morning, 1 at dinner.

## 2016-07-11 ENCOUNTER — Encounter: Payer: Self-pay | Admitting: Family Medicine

## 2016-07-14 ENCOUNTER — Encounter: Payer: Self-pay | Admitting: Family Medicine

## 2016-07-24 DIAGNOSIS — R7989 Other specified abnormal findings of blood chemistry: Secondary | ICD-10-CM | POA: Diagnosis not present

## 2016-07-24 DIAGNOSIS — L4050 Arthropathic psoriasis, unspecified: Secondary | ICD-10-CM | POA: Diagnosis not present

## 2016-07-24 DIAGNOSIS — L401 Generalized pustular psoriasis: Secondary | ICD-10-CM | POA: Diagnosis not present

## 2016-07-24 DIAGNOSIS — E559 Vitamin D deficiency, unspecified: Secondary | ICD-10-CM | POA: Diagnosis not present

## 2016-07-24 DIAGNOSIS — Z79899 Other long term (current) drug therapy: Secondary | ICD-10-CM | POA: Diagnosis not present

## 2016-07-24 DIAGNOSIS — M150 Primary generalized (osteo)arthritis: Secondary | ICD-10-CM | POA: Diagnosis not present

## 2016-07-28 ENCOUNTER — Ambulatory Visit: Payer: Medicare Other | Admitting: Family Medicine

## 2016-08-21 ENCOUNTER — Other Ambulatory Visit: Payer: Self-pay | Admitting: Family Medicine

## 2016-08-21 ENCOUNTER — Telehealth: Payer: Self-pay | Admitting: *Deleted

## 2016-08-21 NOTE — Telephone Encounter (Signed)
Change to toprol XL 25mg . Please send this in, and advise pt of the reason for change.  Once no longer on backorder, can change back to atenolol, if desired.  Have her monitor BP and pulse, and let us know if it doesn't seem to be working as well.  This should be a reasonable substitute.

## 2016-08-21 NOTE — Telephone Encounter (Signed)
[facility] called and atenolol is on backorder and she is out of this medication, they need you to send an alternative.

## 2016-09-05 DIAGNOSIS — Z79899 Other long term (current) drug therapy: Secondary | ICD-10-CM | POA: Diagnosis not present

## 2016-09-05 DIAGNOSIS — L4050 Arthropathic psoriasis, unspecified: Secondary | ICD-10-CM | POA: Diagnosis not present

## 2016-09-22 ENCOUNTER — Other Ambulatory Visit: Payer: Self-pay | Admitting: Family Medicine

## 2016-09-22 DIAGNOSIS — E7800 Pure hypercholesterolemia, unspecified: Secondary | ICD-10-CM

## 2016-09-22 DIAGNOSIS — E119 Type 2 diabetes mellitus without complications: Secondary | ICD-10-CM

## 2016-11-24 DIAGNOSIS — R7989 Other specified abnormal findings of blood chemistry: Secondary | ICD-10-CM | POA: Diagnosis not present

## 2016-11-24 DIAGNOSIS — Z6836 Body mass index (BMI) 36.0-36.9, adult: Secondary | ICD-10-CM | POA: Diagnosis not present

## 2016-11-24 DIAGNOSIS — M150 Primary generalized (osteo)arthritis: Secondary | ICD-10-CM | POA: Diagnosis not present

## 2016-11-24 DIAGNOSIS — L401 Generalized pustular psoriasis: Secondary | ICD-10-CM | POA: Diagnosis not present

## 2016-11-24 DIAGNOSIS — E559 Vitamin D deficiency, unspecified: Secondary | ICD-10-CM | POA: Diagnosis not present

## 2016-11-24 DIAGNOSIS — E669 Obesity, unspecified: Secondary | ICD-10-CM | POA: Diagnosis not present

## 2016-11-24 DIAGNOSIS — L4050 Arthropathic psoriasis, unspecified: Secondary | ICD-10-CM | POA: Diagnosis not present

## 2016-11-24 DIAGNOSIS — Z79899 Other long term (current) drug therapy: Secondary | ICD-10-CM | POA: Diagnosis not present

## 2016-11-25 NOTE — Progress Notes (Deleted)
April Matthews is a 71 y.o. female who presents for annual wellness visit and follow-up on chronic medical conditions.  She has the following concerns:  Hyperlipidemia:  Tolerating atorvastatin without side effects.  Lipids prior to starting were: RecentLabs       Lab Results  Component Value Date   CHOL 187 12/17/2015   HDL 42 (L) 12/17/2015   LDLCALC 101 12/17/2015   TRIG 218 (H) 12/17/2015   CHOLHDL 4.5 12/17/2015     Lipids after being on statin: Lab Results  Component Value Date   CHOL 122 (L) 07/10/2016   HDL 47 07/10/2016   LDLCALC 47 07/10/2016   TRIG 139 07/10/2016   CHOLHDL 2.6 07/10/2016    She had some stomach upset at first, but since changing it to night-time dosing, hasn't had any problems. Denies myalgias or other side effects.  Hypertension: BP's are checked at other doctors, has been normal. Losartan had been stopped due to low BP's with remicade infusions a while ago. No longer checking at home, because they were running similar to when checked elsewhere. She has been on atenolol for a very long time (she believes started for palpitations/irregular heartbeat, many years ago). No headaches, dizziness, chest pain.  Diabetes:  She doesn't check blood sugars.  Her metformin was increased to 1066m daily at her visit in February, after A1c rose to 7.6. She denies side effects. A1c in June was much improved, at 6.7, but crept up to 7.0 in September. Metformin dose was increased to 15094mat that time.  Due for repeat A1c today.  Lab Results  Component Value Date   HGBA1C 7.0 07/10/2016   Obesity: PREVIOUS:She has continued to lose weight--down an additional pound from 3 months ago. She had lost more, but gained some on vacation, parties. Husband has been bringing home sweets, donuts.  She is mad at herself, because she reports being down to 208. She will be closing the pool in the next 3 weeks.  She has a new recumbent bicycle, but it caused her knee  pain when she tried it, so hasn't used it. She is planning to have knee surgery at some point.    Immunization History  Administered Date(s) Administered  . Influenza Split 09/08/2012, 08/10/2014, 08/21/2015  . Influenza, High Dose Seasonal PF 07/10/2016  . PPD Test 09/08/2012  . Pneumococcal Conjugate-13 04/20/2014  . Pneumococcal Polysaccharide-23 04/23/2015  . Tdap 06/19/2011   she states she gets yearly flu shots (at Dr. BeMelissa Noonffice) Shingles vaccine is contraindicated (live virus) Last Pap smear: 03/2014 Last mammogram: 01/2015 Last colonoscopy: 07/2014 diverticulosis Last DEXA: 02/2009--osteopenia Dentist: goes yearly Ophtho: goes yearly; also sees retinal specialist Exercise: recently opened pool, swimming 45 laps daily (plus aerobic/running in the pool, has her own pool)--about 1-1.5 hours of exercise daily since she opened her pool 2 weeks ago. She has been having a flare of arthritis in her left foot.  She got 10,000 steps daily prior to opening pool (may not have had adequate cardio).  Other doctors caring for patient include: Dr. BeAmil Amenrheum) Dr. Amy JoMartiniquederm) Dr. StKathrin Pennerophtho) No longer sees eye oncologist to follow a mole L eye--Dr. StKathrin Pennerow follows. [facility] Dr. O'Sharlyn Bolognadentist)  Depression screen:  See screening questionnaire in epic--normal. ADL screen:  See screening questionnaire.  Notable only for limitation in walking due to foot pain. Fall screen: negative.  End of Life Discussion:  Patient has a medical power of attorney (her  daughter); unknown if she has Living Will.  Did not complete the paperwork given last year.   ROS: The patient denies anorexia, fever, headaches, vision changes, hearing changes, ear pain, sore throat, breast concerns, chest pain, palpitations, dizziness, syncope, dyspnea on exertion, cough, swelling, nausea, vomiting, diarrhea, constipation, abdominal pain,  melena, hematochezia, indigestion/heartburn, hematuria, incontinence, dysuria, vaginal bleeding, discharge, odor or itch, genital lesions, numbness, tingling, weakness, tremor, suspicious skin lesions, depression, anxiety, abnormal bleeding/bruising, or enlarged lymph nodes. Joint pains as per HPI, mainly in left foot. She denies any thyroid symptoms--no hair/skin/bowel changes, no mood changes, just the weight gain.  See HPI.    PHYSICAL EXAM:    General Appearance:   Alert, cooperative, no distress, appears stated age  Head:   Normocephalic, without obvious abnormality, atraumatic  Eyes:   PERRL, conjunctiva/corneas clear, EOM's intact, fundi   benign  Ears:   Normal TM's and external ear canals  Nose:  Nares normal, no purulent drainage or sinus tenderness. Nasal mucosa is normal. No sinus tenderness  Throat:  Lips, mucosa, and tongue normal; teeth and gums normal  Neck:  Supple, no lymphadenopathy; thyroid: no enlargement/tenderness/nodules; no JVD. +R carotid bruit noted, unchanged.  Back:  Spine nontender, no curvature, ROM normal, no CVA tenderness  Lungs:   Clear to auscultation bilaterally without wheezes, rales or ronchi; respirations unlabored  Chest Wall:   No tenderness or deformity  Heart:   Regular rate and rhythm, S1 and S2 normal, no rub  or gallop, 2/6 SEM  Breast Exam:   No tenderness, masses, or nipple discharge or inversion. No axillary lymphadenopathy  Abdomen:   Soft, non-tender, nondistended, normoactive bowel sounds,   no masses, no hepatosplenomegaly. Nontender, reducible umbilical hernia. WHSS midline lower abdomen.  Genitalia:   Normal external genitalia without lesions, +atrophic changes. BUS and vagina normal; no cervical motion tenderness. No abnormal vaginal discharge. Uterus and adnexa not enlarged, but exam is somewhat limited by her body habitus; adnexa nontender, no masses. Pap not  performed.   Rectal:   Normal tone, no masses or tenderness; guaiac negative stool  Extremities:  No clubbing, cyanosis or edema  Pulses:  2+ and symmetric all extremities  Skin:  Skin turgor normal, no suspicious lesions. Various scattered erythematous lesions, plaques. Involvement at her groin bilaterally as well.  Lymph nodes:  Cervical, supraclavicular, and axillary nodes normal  Neurologic:  CNII-XII intact, normal strength, sensation and gait; reflexes 2+ and symmetric throughout   Psych: Normal mood, affect, hygiene and grooming.       Normal diabetic foot exam.     ASSESSMENT/PLAN:  c-met, lipids, TSH, CBC (?) A1c  Discussed monthly self breast exams and yearly mammograms. Recommended at least 30 minutes of aerobic activity at least 5 days/week, weight-bearing exercise 2x/week; proper sunscreen use reviewed; healthy diet, including goals of calcium and vitamin D intake and alcohol recommendations (less than or equal to 1 drink/day) reviewed; regular seatbelt use; changing batteries in smoke detectors. Immunization recommendations discussed-- yearly high dose flu shots recommended.  Colonoscopy is UTD. Past due for DEXA. Previously ordered, never scheduled Past due for mammogram  Schedule mammo and DEXA Shingrix when available  End of Life: Given paperwork again or Living Will and healthcare power of attorney (again) and asked to send [place] copies of completed forms. MOST form reviewed and updated--Full Code, full care   Medicare Attestation I have personally reviewed: The patient's medical and social history Their use of alcohol, tobacco or illicit drugs Their current medications and  supplements The patient's functional ability including ADLs,fall risks, home safety risks, cognitive, and hearing and visual impairment Diet and physical activities Evidence for depression or mood disorders  The patient's weight,  height, BMI, and visual acuity have been recorded in the chart.  I have made referrals, counseling, and provided education to the patient based on review of the above and I have provided the patient with a written personalized care plan for preventive services.     Shaunta Oncale A, MD   11/25/2016

## 2016-11-26 NOTE — Progress Notes (Signed)
Chief Complaint  Patient presents with  . Medicare Wellness    fasting AWV/med check plus. Has been sick since day after Thankgiving. Thinks she had norovirus-has had diarrhea off and on since, esp after she eats breakfast.     April Matthews is a 71 y.o. female who presents for annual wellness visit and follow-up on chronic medical conditions.  She has the following concerns:  After Thanksgiving she had GI bug--vomiting, diarrhea (grandchildren had been sick).  Vomited x 1d, felt lousy and fatigued for 2-3 days.  Diarrhea lasted at least 10 days.  Since then, bowels have been intermittently loose, with some bloating/gas. Gas-X helps some.  She has noticed increased symptoms after dairy.  She recently switched to almondmilk.  Last night she had spinach and mushrooms with heavy cream, and had diarrhea afterwards. She took [facility] probiotic for a month, which she found helpful, but she completed the bottle. Denies travel outside of the country, camping, no antibiotics.  Hyperlipidemia:  Tolerating atorvastatin without side effects. Lipids prior to starting were: RecentLabs       Lab Results  Component Value Date   CHOL 187 12/17/2015   HDL 42 (L) 12/17/2015   LDLCALC 101 12/17/2015   TRIG 218 (H) 12/17/2015   CHOLHDL 4.5 12/17/2015     Lipids after being on statin: RecentLabs       Lab Results  Component Value Date   CHOL 122 (L) 07/10/2016   HDL 47 07/10/2016   LDLCALC 47 07/10/2016   TRIG 139 07/10/2016   CHOLHDL 2.6 07/10/2016     Denies myalgias or other side effects.  Hypertension: BP's are checked at other doctors--it previously had been normal. It was 156/90 at Dr. Melissa Noon office earlier this week. She hasn't had it checked since atenolol was changed to metoprolol (due to unavailability of the atenolol). Losartan had been stopped due to low BP's with remicade infusions a while ago. No longer checking at home, because they were running similar to  when checked elsewhere. She has been on atenolol for a very long time (she believes started for palpitations/irregular heartbeat, many years ago). No headaches, dizziness, chest pain.  Diabetes: She doesn't check blood sugars. Her metformin was increased to 1000mg  daily at her visit in February, after A1c rose to 7.6. She denies side effects. A1c in June was much improved, at 6.7, but crept up to 7.0 in September. Metformin dose was increased to 1500mg  at that time.  Due for repeat A1c today. She is tolerating this without side effects (had been taking it without bowel problems prior to the acute GI illness in November).  RecentLabs       Lab Results  Component Value Date   HGBA1C 7.0 07/10/2016     Obesity: She continues to lose weight. Had gotten down to 206 with the stomach illness, has regained that weight, but still down 5# from her last visit.  Typically has been losing about a pound/week.  Psoriatic arthritis:  She saw Dr. Amil Amen this week; no labs done, as she would be having them today.  Wants copies of labs.  Doing well on her current regimen (Stelara).  Next injection due 2/16.      Immunization History  Administered Date(s) Administered  . Influenza Split 09/08/2012, 08/10/2014, 08/21/2015  . Influenza, High Dose Seasonal PF 07/10/2016  . PPD Test 09/08/2012  . Pneumococcal Conjugate-13 04/20/2014  . Pneumococcal Polysaccharide-23 04/23/2015  . Tdap 06/19/2011   zostavax is contraindicated (live virus) Last Pap  smear: 03/2014 Last mammogram: 01/2015 Last colonoscopy: 07/2014 diverticulosis Last DEXA: 02/2009--osteopenia Dentist: goes twice yearly Ophtho: goes yearly; also sees retinal specialist Exercise: No regular exercise, just some walking.  Limited some by knee pain.  Has recumbent bike, hasn't used.  Knee is feeling better now.   Other doctors caring for patient include: Dr. Amil Amen (rheum) Dr. Amy Martinique (derm) Dr. Kathrin Penner (ophtho) Dr. Oval Linsey  (retinal specialist/oncologist at [facility] ophtho) Dr. Luther Parody (dentist)  Depression screen: See screening questionnaire in epic--normal. Functional status screen: See screening questionnaire. Notable only for limitation in walking due to knee pain. Fall screen: negative.  End of Life Discussion: Patient hasa medical power of attorney (her daughter); unknown if she has Living Will. Did not complete the paperwork given last year.  Past Medical History:  Diagnosis Date  . Abnormal mammogram 9/07   Right breast--bx sclerosing papilloma  . Abnormal mammogram 02/2009   Left; suspicious abnormality L breast mammo and u/s.  Biopsy recommended but patient never did nor f/u mammo  . Abnormal TSH 2015   negative Ab's; borderline on repeat  . Diabetes type 2, controlled ([place])    A1c 6.5 07/2014 (had been prediabetic prior)  . Diverticulosis 07/2014   mild, noted on colonoscopy  . DM (diabetes mellitus), gestational    with first pregnancy (stillborn)--based on autopsy; not diagnosed during pregnancy  . Heart murmur   . HTN (hypertension)   . Kidney stone 1997  . Osteopenia 7/07   mild  . Psoriatic arthritis ([place])    on Stelara (Dr. Amil Amen); previously took Remicade  . Seasonal allergies   . Varicose veins   . Venous insufficiency   . Vitamin D deficiency 12/09    Past Surgical History:  Procedure Laterality Date  . BREAST BIOPSY Right 9/07   right--sclerosing papilloma  . [place]  . TONSILLECTOMY AND ADENOIDECTOMY      Social History   Social History  . Marital status: Married    Spouse name: N/A  . Number of children: N/A  . Years of education: N/A   Occupational History  . 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 Smoking status: Never Smoker  . Smokeless tobacco: Never Used  . Alcohol use No     Comment: none  . Drug use: No  . Sexual activity: Yes    Partners: Male   Other Topics Concern  . Not on  file   Social History Narrative   Lives with husband. Children live in [place], [place], [place], and [place], [place]. 4 grandchildren.   Retired 05/2016    Family History  Problem Relation Age of Onset  . Stroke Mother   . Hypertension Mother   . Cancer Mother     SKIN CANCER  . GER disease Father   . Heart disease Brother   . Cancer Brother     prostate  . Cancer Maternal Aunt     breast cancer late 65's  . Cancer Maternal Grandmother     breast cancer 60's  . Colon cancer Neg Hx   . Rectal cancer Neg Hx   . Stomach cancer Neg Hx     Outpatient Encounter Prescriptions as of 11/27/2016  Medication Sig Note  . atorvastatin (LIPITOR) 20 MG tablet TAKE 1 TABLET(20 MG) BY MOUTH DAILY   . CHIA SEED PO Take 15 mLs by mouth daily.   . Cholecalciferol (VITAMIN D) 2000 units tablet Take 2,000 Units by mouth daily.   Marland Kitchen  clobetasol (TEMOVATE) 0.05 % external solution APPLY TO THE SKIN BID 04/09/2016: Received from: External Pharmacy  . metFORMIN (GLUCOPHAGE-XR) 500 MG 24 hr tablet Take 3 tablets (1,500 mg total) by mouth daily with breakfast.   . metoprolol succinate (TOPROL-XL) 25 MG 24 hr tablet Take 1 tablet (25 mg total) by mouth daily.   . ranitidine (ZANTAC) 75 MG tablet Take 75 mg by mouth 2 (two) times daily. 11/27/2016: Uses prn  . ustekinumab (STELARA) 45 MG/0.5ML SOSY injection Inject 45 mg into the skin. 12/17/2015: Next dose March 3,2017 then q 12 weeks.   . [DISCONTINUED] atenolol (TENORMIN) 25 MG tablet TAKE 1 TABLET BY MOUTH EVERY DAY   . [DISCONTINUED] atorvastatin (LIPITOR) 20 MG tablet TAKE 1 TABLET(20 MG) BY MOUTH DAILY   . [DISCONTINUED] cholecalciferol (VITAMIN D) 1000 units tablet Take 1,000 Units by mouth daily.   . [DISCONTINUED] metoprolol succinate (TOPROL-XL) 25 MG 24 hr tablet Take 25 mg by mouth daily.   . Calcium Carb-Cholecalciferol (LIQUID CALCIUM WITH D3 PO) Take 15 mLs by mouth 2 (two) times daily. 11/27/2016: Bothered her stomach, so stopped taking it  . EPINEPHrine  (EPIPEN 2-PAK) 0.3 mg/0.3 mL IJ SOAJ injection Inject 0.3 mLs (0.3 mg total) into the muscle once. (Patient not taking: Reported on 07/10/2016)   . ipratropium (ATROVENT) 0.03 % nasal spray USE 2 SPRAYS IN EACH NOSTRIL EVERY 12 HOURS (Patient not taking: Reported on 11/27/2016) 11/27/2016: Uses prn  . loratadine (CLARITIN) 10 MG tablet Take 10 mg by mouth daily.   Marland Kitchen losartan (COZAAR) 25 MG tablet Take 1 tablet (25 mg total) by mouth daily.    No facility-administered encounter medications on file as of 11/27/2016.     Allergies  Allergen Reactions  . Sesame Oil Anaphylaxis    Sesame seed/oil  . Ace Inhibitors Cough  . Latex Rash    ROS: The patient denies anorexia, fever, headaches, vision changes, hearing changes, ear pain, sore throat, breast concerns, chest pain, palpitations, dizziness, syncope, dyspnea on exertion, cough, swelling, nausea, vomiting, diarrhea, constipation, abdominal pain, melena, hematochezia, indigestion/heartburn, hematuria, incontinence, dysuria, vaginal bleeding, discharge, odor or itch, genital lesions, numbness, tingling, weakness, tremor, suspicious skin lesions, depression, anxiety, abnormal bleeding/bruising, or enlarged lymph nodes. Joint pains as per HPI, mainly knees currently, improving.  See HPI.    PHYSICAL EXAM:  BP (!) 150/90 (BP Location: Left Arm, Patient Position: Sitting, Cuff Size: Normal)   Pulse 68   Ht 5' 3.5" (1.613 m)   Wt 211 lb 9.6 oz (96 kg)   BMI 36.90 kg/m    General Appearance:   Alert, cooperative, no distress, appears stated age  Head:   Normocephalic, without obvious abnormality, atraumatic  Eyes:   PERRL, conjunctiva/corneas clear, EOM's intact, fundi   benign  Ears:   Normal TM's and external ear canals  Nose:  Nares normal, no purulent drainage or sinus tenderness. Nasal mucosa is normal. No sinus tenderness  Throat:  Lips, mucosa, and tongue normal; teeth and gums normal  Neck:  Supple, no  lymphadenopathy; thyroid: no enlargement/tenderness/nodules; no JVD. ? Very slight R carotid bruit noted (previously noted)  Back:  Spine nontender, no curvature, ROM normal, no CVA tenderness  Lungs:   Clear to auscultation bilaterally without wheezes, rales or ronchi; respirations unlabored  Chest Wall:   No tenderness or deformity  Heart:   Regular rate and rhythm, S1 and S2 normal, no rub or gallop, 2/6 SEM  Breast Exam:   No tenderness, masses, or nipple discharge or  inversion. No axillary lymphadenopathy  Abdomen:   Soft, non-tender, nondistended, normoactive bowel sounds,   no masses, no hepatosplenomegaly. Nontender, reducible umbilical hernia. WHSS midline lower abdomen.  Genitalia:   Normal external genitalia without lesions, +atrophic changes. BUS and vagina normal; no cervical motion tenderness. No abnormal vaginal discharge. Uterus and adnexa not enlarged, but exam is somewhat limited by her body habitus; adnexa nontender, no masses. Pap not performed.   Rectal:   Normal tone, no masses or tenderness; guaiac negative stool  Extremities:  No clubbing, cyanosis or edema  Pulses:  2+ and symmetric all extremities  Skin:  Skin turgor normal, no suspicious lesions.  Lymph nodes:  Cervical, supraclavicular, and axillary nodes normal  Neurologic:  CNII-XII intact, normal strength, sensation and gait; reflexes 2+ and symmetric throughout  Psych: Normal mood, affect, hygiene and grooming.       Normal diabetic foot exam.   Lab Results  Component Value Date   HGBA1C 6.6% 11/27/2016     ASSESSMENT/PLAN:  Medicare annual wellness visit, subsequent  Psoriatic arthritis ([place]) - doing well on Stelara  Diabetes mellitus without complication ([place]) - controlled; daily exercise and continued weight loss encouraged - Plan: HgB A1c, Comprehensive metabolic panel, TSH, Microalbumin / creatinine urine ratio,  atorvastatin (LIPITOR) 20 MG tablet  Pure hypercholesterolemia - continue lipitor - Plan: Lipid panel, Comprehensive metabolic panel, atorvastatin (LIPITOR) 20 MG tablet  Controlled type 2 diabetes mellitus without complication, without long-term current use of insulin ([facility])  Medication monitoring encounter - Plan: Lipid panel, Comprehensive metabolic panel, CBC with Differential/Platelet  Hypertension associated with diabetes ([name]) - suboptimally controlled--restart losartan; low sodium diet reviewed - Plan: losartan (COZAAR) 25 MG tablet  Benign hypertension - Plan: metoprolol succinate (TOPROL-XL) 25 MG 24 hr tablet  Osteopenia, unspecified location - discussed calcium, Vit D, weight-bearing exercise. Recheck DEXA - Plan: DG Bone Density  Postmenopausal estrogen deficiency - Plan: DG Bone Density   COPIES OF LABS TO DR Amil Amen  Discussed monthly self breast exams and yearly mammograms. Recommended at least 30 minutes of aerobic activity at least 5 days/week, weight-bearing exercise 2x/week; proper sunscreen use reviewed; healthy diet, including goals of calcium and vitamin D intake and alcohol recommendations (less than or equal to 1 drink/day) reviewed; regular seatbelt use; changing batteries in smoke detectors. Immunization recommendations discussed-- yearly high dose flu shots recommended. Shingrix recommended.Colonoscopy is UTD. Past due for DEXA. Previously ordered, never scheduled Past due for mammogram--plans to get q67yrs. Will schedule both together.   End of Life: Given paperwork again for Living Will and healthcare power of attorney and asked to send [place] copies of completed forms. MOST form reviewed and updated--Full Code, full care   Try and avoid dairy products for at least a week. I do recommend trial of probiotics (ie Align) to re-set the natural gut flora.  Continue to use simethicone as needed for gas/bloating. If you have ongoing problems with diarrhea, please  return--likely will need additional testing, including stool studies. Consider trying metamucil daily, and/or separating the metformin doses, as sometimes the metformin can contribute to some GI symptoms.   Restart losartan 25 mg once daily.  Find your blood pressure monitor, and start checking it regularly at home. Keep a list with columns for Date/Morning/Evening/Comments.  You only need to check a few times/week, but put the value in the appropriately timed column.  Use comments for reasons why values may be off, or symptoms--ie salty meal, forgot meds, had bad pain, felt dizzy.  Bring your  list and your monitor to your follow-up visit in 4-6 weeks.   Medicare Attestation I have personally reviewed: The patient's medical and social history Their use of alcohol, tobacco or illicit drugs Their current medications and supplements The patient's functional ability including ADLs,fall risks, home safety risks, cognitive, and hearing and visual impairment Diet and physical activities Evidence for depression or mood disorders  The patient's weight, height, and BMI have been recorded in the chart.  I have made referrals, counseling, and provided education to the patient based on review of the above and I have provided the patient with a written personalized care plan for preventive services.     Hakop Humbarger A, MD

## 2016-11-27 ENCOUNTER — Ambulatory Visit: Payer: Medicare Other | Admitting: Family Medicine

## 2016-11-27 ENCOUNTER — Encounter: Payer: Self-pay | Admitting: Family Medicine

## 2016-11-27 ENCOUNTER — Ambulatory Visit (INDEPENDENT_AMBULATORY_CARE_PROVIDER_SITE_OTHER): Payer: Medicare Other | Admitting: Family Medicine

## 2016-11-27 VITALS — BP 150/90 | HR 68 | Ht 63.5 in | Wt 211.6 lb

## 2016-11-27 DIAGNOSIS — Z0000 Encounter for general adult medical examination without abnormal findings: Secondary | ICD-10-CM | POA: Diagnosis not present

## 2016-11-27 DIAGNOSIS — I152 Hypertension secondary to endocrine disorders: Secondary | ICD-10-CM

## 2016-11-27 DIAGNOSIS — E1159 Type 2 diabetes mellitus with other circulatory complications: Secondary | ICD-10-CM | POA: Diagnosis not present

## 2016-11-27 DIAGNOSIS — I10 Essential (primary) hypertension: Secondary | ICD-10-CM | POA: Diagnosis not present

## 2016-11-27 DIAGNOSIS — E7800 Pure hypercholesterolemia, unspecified: Secondary | ICD-10-CM

## 2016-11-27 DIAGNOSIS — Z780 Asymptomatic menopausal state: Secondary | ICD-10-CM | POA: Diagnosis not present

## 2016-11-27 DIAGNOSIS — Z5181 Encounter for therapeutic drug level monitoring: Secondary | ICD-10-CM | POA: Diagnosis not present

## 2016-11-27 DIAGNOSIS — M8580 Other specified disorders of bone density and structure, unspecified site: Secondary | ICD-10-CM | POA: Diagnosis not present

## 2016-11-27 DIAGNOSIS — L4050 Arthropathic psoriasis, unspecified: Secondary | ICD-10-CM | POA: Diagnosis not present

## 2016-11-27 DIAGNOSIS — E119 Type 2 diabetes mellitus without complications: Secondary | ICD-10-CM

## 2016-11-27 LAB — CBC WITH DIFFERENTIAL/PLATELET
BASOS ABS: 80 {cells}/uL (ref 0–200)
Basophils Relative: 1 %
EOS PCT: 3 %
Eosinophils Absolute: 240 cells/uL (ref 15–500)
HCT: 39.3 % (ref 35.0–45.0)
HEMOGLOBIN: 12.9 g/dL (ref 11.7–15.5)
LYMPHS ABS: 2320 {cells}/uL (ref 850–3900)
LYMPHS PCT: 29 %
MCH: 31.2 pg (ref 27.0–33.0)
MCHC: 32.8 g/dL (ref 32.0–36.0)
MCV: 95.2 fL (ref 80.0–100.0)
MONOS PCT: 5 %
MPV: 10 fL (ref 7.5–12.5)
Monocytes Absolute: 400 cells/uL (ref 200–950)
NEUTROS PCT: 62 %
Neutro Abs: 4960 cells/uL (ref 1500–7800)
Platelets: 323 10*3/uL (ref 140–400)
RBC: 4.13 MIL/uL (ref 3.80–5.10)
RDW: 13.7 % (ref 11.0–15.0)
WBC: 8 10*3/uL (ref 4.0–10.5)

## 2016-11-27 LAB — COMPREHENSIVE METABOLIC PANEL
ALBUMIN: 4.4 g/dL (ref 3.6–5.1)
ALT: 18 U/L (ref 6–29)
AST: 16 U/L (ref 10–35)
Alkaline Phosphatase: 65 U/L (ref 33–130)
BUN: 21 mg/dL (ref 7–25)
CALCIUM: 9.4 mg/dL (ref 8.6–10.4)
CHLORIDE: 104 mmol/L (ref 98–110)
CO2: 25 mmol/L (ref 20–31)
Creat: 1.16 mg/dL — ABNORMAL HIGH (ref 0.60–0.93)
Glucose, Bld: 107 mg/dL — ABNORMAL HIGH (ref 65–99)
POTASSIUM: 4.1 mmol/L (ref 3.5–5.3)
SODIUM: 140 mmol/L (ref 135–146)
TOTAL PROTEIN: 7.3 g/dL (ref 6.1–8.1)
Total Bilirubin: 0.4 mg/dL (ref 0.2–1.2)

## 2016-11-27 LAB — LIPID PANEL
CHOL/HDL RATIO: 2.5 ratio (ref <5.0)
CHOLESTEROL: 112 mg/dL (ref <200)
HDL: 45 mg/dL — AB (ref >50)
LDL Cholesterol: 37 mg/dL (ref <100)
TRIGLYCERIDES: 148 mg/dL (ref <150)
VLDL: 30 mg/dL (ref <30)

## 2016-11-27 LAB — POCT GLYCOSYLATED HEMOGLOBIN (HGB A1C)

## 2016-11-27 LAB — TSH: TSH: 4.35 m[IU]/L

## 2016-11-27 MED ORDER — ATORVASTATIN CALCIUM 20 MG PO TABS: ORAL_TABLET | ORAL | 1 refills | Status: DC

## 2016-11-27 MED ORDER — METOPROLOL SUCCINATE ER 25 MG PO TB24: 25.0000 mg | ORAL_TABLET | Freq: Every day | ORAL | 1 refills | Status: DC

## 2016-11-27 MED ORDER — LOSARTAN POTASSIUM 25 MG PO TABS: 25.0000 mg | ORAL_TABLET | Freq: Every day | ORAL | 0 refills | Status: DC

## 2016-11-27 NOTE — Patient Instructions (Addendum)
HEALTH MAINTENANCE RECOMMENDATIONS:  It is recommended that you get at least 30 minutes of aerobic exercise at least 5 days/week (for weight loss, you may need as much as 60-90 minutes). This can be any activity that gets your heart rate up. This can be divided in 10-15 minute intervals if needed, but try and build up your endurance at least once a week.  Weight bearing exercise is also recommended twice weekly.  Eat a healthy diet with lots of vegetables, fruits and fiber.  "Colorful" foods have a lot of vitamins (ie green vegetables, tomatoes, red peppers, etc).  Limit sweet tea, regular sodas and alcoholic beverages, all of which has a lot of calories and sugar.  Up to 1 alcoholic drink daily may be beneficial for women (unless trying to lose weight, watch sugars).  Drink a lot of water.  Calcium recommendations are 1200-1500 mg daily (1500 mg for postmenopausal women or women without ovaries), and vitamin D 1000 IU daily.  This should be obtained from diet and/or supplements (vitamins), and calcium should not be taken all at once, but in divided doses.  Monthly self breast exams and yearly mammograms for women over the age of 43 is recommended.  Sunscreen of at least SPF 30 should be used on all sun-exposed parts of the skin when outside between the hours of 10 am and 4 pm (not just when at beach or pool, but even with exercise, golf, tennis, and yard work!)  Use a sunscreen that says "broad spectrum" so it covers both UVA and UVB rays, and make sure to reapply every 1-2 hours.  Remember to change the batteries in your smoke detectors when changing your clock times in the spring and fall.  Use your seat belt every time you are in a car, and please drive safely and not be distracted with cell phones and texting while driving.   April Matthews , Thank you for taking time to come for your Medicare Wellness Visit. I appreciate your ongoing commitment to your health goals. Please review the following  plan we discussed and let me know if I can assist you in the future.   These are the goals we discussed: Goals    None      This is a list of the screening recommended for you and due dates:  Health Maintenance  Topic Date Due  . Shingles Vaccine  12/21/2005  . Hemoglobin A1C  01/07/2017  . Mammogram  02/08/2017  . Complete foot exam   04/09/2017  . Urine Protein Check  04/09/2017  . Eye exam for diabetics  07/09/2017  . Tetanus Vaccine  06/18/2021  . Colon Cancer Screening  08/25/2024  . Flu Shot  Completed  . DEXA scan (bone density measurement)  Completed  .  Hepatitis C: One time screening is recommended by Center for Disease Control  (CDC) for  adults born from [birth year] through 1965.   Completed  . Pneumonia vaccines  Completed   You should NOT get the zostavax.  I recommend getting the new shingles vaccine (Shingrix) when available. You will need to check with your insurance to see if it is covered, and if covered by Medicare Part D, you need to get from the pharmacy rather than our office.  It is a series of 2 injections, spaced 2 months apart.  A1c and foot exam were done today. We can discuss cologard vs colonoscopy when you are due for your next screening (2025)  I recommend getting another bone  density, as previously discussed. I entered the order so you can schedule along with your mammogram. I recommend yearly 3D mammograms.   Try and avoid dairy products for at least a week. I do recommend trial of probiotics (ie Align) to re-set the natural gut flora.  Continue to use simethicone as needed for gas/bloating. If you have ongoing problems with diarrhea, please return--likely will need additional testing, including stool studies. Consider trying metamucil daily, and/or separating the metformin doses, as sometimes the metformin can contribute to some GI symptoms.   Restart losartan 25 mg once daily.  Find your blood pressure monitor, and start checking it regularly at home.  Keep a list with columns for Date/Morning/Evening/Comments.  You only need to check a few times/week, but put the value in the appropriately timed column.  Use comments for reasons why values may be off, or symptoms--ie salty meal, forgot meds, had bad pain, felt dizzy.  Bring your list and your monitor to your follow-up visit in 4-6 weeks.

## 2016-11-28 LAB — MICROALBUMIN / CREATININE URINE RATIO
Creatinine, Urine: 178 mg/dL (ref 20–320)
Microalb Creat Ratio: 4 mcg/mg creat (ref <30)
Microalb, Ur: 0.8 mg/dL

## 2016-12-09 DIAGNOSIS — L4050 Arthropathic psoriasis, unspecified: Secondary | ICD-10-CM | POA: Diagnosis not present

## 2016-12-20 ENCOUNTER — Other Ambulatory Visit: Payer: Self-pay | Admitting: Family Medicine

## 2016-12-20 DIAGNOSIS — E119 Type 2 diabetes mellitus without complications: Secondary | ICD-10-CM

## 2016-12-20 DIAGNOSIS — E7800 Pure hypercholesterolemia, unspecified: Secondary | ICD-10-CM

## 2016-12-31 NOTE — Progress Notes (Signed)
Chief Complaint  Patient presents with  . Hypertension    follow up hypertension. Also left leg (upper) is swollen and painful, thinks maybe because she was 10 days late getting her stelara injection. Skin is tingling and sensitive-nerve endings.     Patient presents for follow up on hypertension.  Losartan had been stopped due to low BP's with remicade infusions a while ago; this was restarted at her last physical, when higher blood pressures were noted.  Atenolol was changed to metoprolol a couple of months ago (due to atenolol not being available).   BP's at home have been running 116-145/72-83 over the last week, mostly 120-130.  Earlier in February, prior to restarting the losartan, her BP's had been 160/80's.  She was also in more pain at that time.  She had her Stelara injection and also started back on the losartan at about the same time.  Joint pain is now improved.  Having some ongoing pain in her left lateral thigh--started when she was late in getting her Stelara.  Has some psoriatic skin patches on that leg that are healing.  Left knee was very inflamed prior to getting the injection.  She continues to have some soreness at the left lateral thigh--not as bad as prior to the shot.  It had been keeping her awake.  She can now sleep, but is still sore. Denies back pain.  Describing as a burning type pain, slightly tingly (burning when presses on it).  PMH, PSH, SH reviewed  She started [place] vitamins and minerals March 1st.   Outpatient Encounter Prescriptions as of 01/01/2017  Medication Sig Note  . atorvastatin (LIPITOR) 20 MG tablet TAKE 1 TABLET(20 MG) BY MOUTH DAILY   . CHIA SEED PO Take 15 mLs by mouth daily.   . clobetasol (TEMOVATE) 0.05 % external solution APPLY TO THE SKIN BID 04/09/2016: Received from: External Pharmacy  . ipratropium (ATROVENT) 0.03 % nasal spray USE 2 SPRAYS IN EACH NOSTRIL EVERY 12 HOURS 11/27/2016: Uses prn  . losartan (COZAAR) 25 MG tablet Take 1 tablet  (25 mg total) by mouth daily.   . metFORMIN (GLUCOPHAGE-XR) 500 MG 24 hr tablet TAKE 3 TABLETS(1500 MG) BY MOUTH DAILY WITH BREAKFAST   . metoprolol succinate (TOPROL-XL) 25 MG 24 hr tablet Take 1 tablet (25 mg total) by mouth daily.   . ustekinumab (STELARA) 45 MG/0.5ML SOSY injection Inject 45 mg into the skin. 12/17/2015: Next dose March 3,2017 then q 12 weeks.   . [DISCONTINUED] Calcium Carb-Cholecalciferol (LIQUID CALCIUM WITH D3 PO) Take 15 mLs by mouth 2 (two) times daily. 11/27/2016: Bothered her stomach, so stopped taking it  . [DISCONTINUED] Cholecalciferol (VITAMIN D) 2000 units tablet Take 2,000 Units by mouth daily.   Marland Kitchen EPINEPHrine (EPIPEN 2-PAK) 0.3 mg/0.3 mL IJ SOAJ injection Inject 0.3 mLs (0.3 mg total) into the muscle once. (Patient not taking: Reported on 07/10/2016)   . loratadine (CLARITIN) 10 MG tablet Take 10 mg by mouth daily.   . ranitidine (ZANTAC) 75 MG tablet Take 75 mg by mouth 2 (two) times daily. 11/27/2016: Uses prn  . [DISCONTINUED] atorvastatin (LIPITOR) 20 MG tablet TAKE 1 TABLET(20 MG) BY MOUTH DAILY    No facility-administered encounter medications on file as of 01/01/2017.    Allergies  Allergen Reactions  . Sesame Oil Anaphylaxis    Sesame seed/oil  . Ace Inhibitors Cough  . Latex Rash   ROS: no fever, chills, URI symptoms, headaches, dizziness, chest pain, shortness of breath, GI or GU  complaints.  No edema, muscle spasms. +psoriatic skin lesions, healing.  Joint pains improved, but residual left thigh discomfort. No bleeding, bruising.   PHYSICAL EXAM:  BP (!) 142/80 (BP Location: Left Arm, Patient Position: Sitting, Cuff Size: Normal)   Pulse 80   Ht 5' 3.5" (1.613 m)   Wt 210 lb (95.3 kg)   BMI 36.62 kg/m   132/78 by MD, left arm 133/74 by pt's machine, L arm after properly positioned/tightened.  Pt's machine initially read 160/86, repeat was 147/80 when nurse checked it was 142/80  Well developed, pleasant, obese female in no distress Neck: no  lymphadenopathy or mass Heart: regular rate and rhythm Lungs: clear bilaterally Extremities: no edema  ASSESSMENT/PLAN:  Hypertension associated with diabetes ([place]) - improved since restarting losartan. Continue current regimen, low sodium diet, regular exercise and weight loss. Monitor is accurate - Plan: Basic metabolic panel  Medication monitoring encounter - Plan: Basic metabolic panel   Monitor accurate b-met today  Left leg pain-- ddx discussed.  ?related to psoriasis vs L3

## 2017-01-01 ENCOUNTER — Ambulatory Visit (INDEPENDENT_AMBULATORY_CARE_PROVIDER_SITE_OTHER): Payer: Medicare Other | Admitting: Family Medicine

## 2017-01-01 ENCOUNTER — Encounter: Payer: Self-pay | Admitting: Family Medicine

## 2017-01-01 VITALS — BP 132/78 | HR 80 | Ht 63.5 in | Wt 210.0 lb

## 2017-01-01 DIAGNOSIS — I10 Essential (primary) hypertension: Secondary | ICD-10-CM

## 2017-01-01 DIAGNOSIS — Z5181 Encounter for therapeutic drug level monitoring: Secondary | ICD-10-CM | POA: Diagnosis not present

## 2017-01-01 DIAGNOSIS — E1159 Type 2 diabetes mellitus with other circulatory complications: Secondary | ICD-10-CM | POA: Diagnosis not present

## 2017-01-01 LAB — BASIC METABOLIC PANEL
BUN: 24 mg/dL (ref 7–25)
CHLORIDE: 102 mmol/L (ref 98–110)
CO2: 27 mmol/L (ref 20–31)
Calcium: 9.9 mg/dL (ref 8.6–10.4)
Creat: 1.23 mg/dL — ABNORMAL HIGH (ref 0.60–0.93)
Glucose, Bld: 182 mg/dL — ABNORMAL HIGH (ref 65–99)
POTASSIUM: 4.7 mmol/L (ref 3.5–5.3)
SODIUM: 140 mmol/L (ref 135–146)

## 2017-01-01 NOTE — Patient Instructions (Signed)
Continue your current medications. You can try topical voltaren (or other topicals such as biofreeze, icy hot) to the thigh pain. Mention this to Dr. Amil Amen if not improving.  Unsure if related to your psoriasis, or if something new could be going on (ie L3 nerve being pinched).  Your monitor is accurate--be sure to set it up like we showed you.

## 2017-01-09 DIAGNOSIS — L4050 Arthropathic psoriasis, unspecified: Secondary | ICD-10-CM | POA: Diagnosis not present

## 2017-01-09 DIAGNOSIS — E559 Vitamin D deficiency, unspecified: Secondary | ICD-10-CM | POA: Diagnosis not present

## 2017-01-09 DIAGNOSIS — M150 Primary generalized (osteo)arthritis: Secondary | ICD-10-CM | POA: Diagnosis not present

## 2017-01-09 DIAGNOSIS — M79605 Pain in left leg: Secondary | ICD-10-CM | POA: Diagnosis not present

## 2017-01-09 DIAGNOSIS — L401 Generalized pustular psoriasis: Secondary | ICD-10-CM | POA: Diagnosis not present

## 2017-01-09 DIAGNOSIS — R7989 Other specified abnormal findings of blood chemistry: Secondary | ICD-10-CM | POA: Diagnosis not present

## 2017-01-09 DIAGNOSIS — Z6836 Body mass index (BMI) 36.0-36.9, adult: Secondary | ICD-10-CM | POA: Diagnosis not present

## 2017-01-09 DIAGNOSIS — Z79899 Other long term (current) drug therapy: Secondary | ICD-10-CM | POA: Diagnosis not present

## 2017-01-09 DIAGNOSIS — E669 Obesity, unspecified: Secondary | ICD-10-CM | POA: Diagnosis not present

## 2017-01-14 DIAGNOSIS — M7632 Iliotibial band syndrome, left leg: Secondary | ICD-10-CM | POA: Diagnosis not present

## 2017-01-14 DIAGNOSIS — M6281 Muscle weakness (generalized): Secondary | ICD-10-CM | POA: Diagnosis not present

## 2017-01-15 DIAGNOSIS — H5203 Hypermetropia, bilateral: Secondary | ICD-10-CM | POA: Diagnosis not present

## 2017-01-15 DIAGNOSIS — H524 Presbyopia: Secondary | ICD-10-CM | POA: Diagnosis not present

## 2017-01-15 DIAGNOSIS — H2513 Age-related nuclear cataract, bilateral: Secondary | ICD-10-CM | POA: Diagnosis not present

## 2017-01-15 DIAGNOSIS — E119 Type 2 diabetes mellitus without complications: Secondary | ICD-10-CM | POA: Diagnosis not present

## 2017-01-15 LAB — HM DIABETES EYE EXAM

## 2017-01-16 DIAGNOSIS — M6281 Muscle weakness (generalized): Secondary | ICD-10-CM | POA: Diagnosis not present

## 2017-01-16 DIAGNOSIS — M7632 Iliotibial band syndrome, left leg: Secondary | ICD-10-CM | POA: Diagnosis not present

## 2017-01-20 DIAGNOSIS — M6281 Muscle weakness (generalized): Secondary | ICD-10-CM | POA: Diagnosis not present

## 2017-01-20 DIAGNOSIS — M7632 Iliotibial band syndrome, left leg: Secondary | ICD-10-CM | POA: Diagnosis not present

## 2017-01-21 ENCOUNTER — Encounter: Payer: Self-pay | Admitting: *Deleted

## 2017-01-23 DIAGNOSIS — M7632 Iliotibial band syndrome, left leg: Secondary | ICD-10-CM | POA: Diagnosis not present

## 2017-01-23 DIAGNOSIS — M6281 Muscle weakness (generalized): Secondary | ICD-10-CM | POA: Diagnosis not present

## 2017-01-28 DIAGNOSIS — M7632 Iliotibial band syndrome, left leg: Secondary | ICD-10-CM | POA: Diagnosis not present

## 2017-01-28 DIAGNOSIS — M6281 Muscle weakness (generalized): Secondary | ICD-10-CM | POA: Diagnosis not present

## 2017-01-30 DIAGNOSIS — M7632 Iliotibial band syndrome, left leg: Secondary | ICD-10-CM | POA: Diagnosis not present

## 2017-01-30 DIAGNOSIS — M6281 Muscle weakness (generalized): Secondary | ICD-10-CM | POA: Diagnosis not present

## 2017-02-02 DIAGNOSIS — M6281 Muscle weakness (generalized): Secondary | ICD-10-CM | POA: Diagnosis not present

## 2017-02-02 DIAGNOSIS — M7632 Iliotibial band syndrome, left leg: Secondary | ICD-10-CM | POA: Diagnosis not present

## 2017-02-06 DIAGNOSIS — M7632 Iliotibial band syndrome, left leg: Secondary | ICD-10-CM | POA: Diagnosis not present

## 2017-02-06 DIAGNOSIS — M6281 Muscle weakness (generalized): Secondary | ICD-10-CM | POA: Diagnosis not present

## 2017-02-09 DIAGNOSIS — M7632 Iliotibial band syndrome, left leg: Secondary | ICD-10-CM | POA: Diagnosis not present

## 2017-02-09 DIAGNOSIS — M6281 Muscle weakness (generalized): Secondary | ICD-10-CM | POA: Diagnosis not present

## 2017-02-13 DIAGNOSIS — M7632 Iliotibial band syndrome, left leg: Secondary | ICD-10-CM | POA: Diagnosis not present

## 2017-02-13 DIAGNOSIS — M6281 Muscle weakness (generalized): Secondary | ICD-10-CM | POA: Diagnosis not present

## 2017-02-25 DIAGNOSIS — M6281 Muscle weakness (generalized): Secondary | ICD-10-CM | POA: Diagnosis not present

## 2017-02-25 DIAGNOSIS — M7632 Iliotibial band syndrome, left leg: Secondary | ICD-10-CM | POA: Diagnosis not present

## 2017-03-03 ENCOUNTER — Other Ambulatory Visit: Payer: Self-pay | Admitting: Family Medicine

## 2017-03-03 DIAGNOSIS — I152 Hypertension secondary to endocrine disorders: Secondary | ICD-10-CM

## 2017-03-03 DIAGNOSIS — L4050 Arthropathic psoriasis, unspecified: Secondary | ICD-10-CM | POA: Diagnosis not present

## 2017-03-03 DIAGNOSIS — I10 Essential (primary) hypertension: Principal | ICD-10-CM

## 2017-03-03 DIAGNOSIS — E1159 Type 2 diabetes mellitus with other circulatory complications: Secondary | ICD-10-CM

## 2017-03-04 DIAGNOSIS — M7632 Iliotibial band syndrome, left leg: Secondary | ICD-10-CM | POA: Diagnosis not present

## 2017-03-04 DIAGNOSIS — M6281 Muscle weakness (generalized): Secondary | ICD-10-CM | POA: Diagnosis not present

## 2017-03-11 DIAGNOSIS — M6281 Muscle weakness (generalized): Secondary | ICD-10-CM | POA: Diagnosis not present

## 2017-03-11 DIAGNOSIS — M7632 Iliotibial band syndrome, left leg: Secondary | ICD-10-CM | POA: Diagnosis not present

## 2017-03-24 DIAGNOSIS — L4050 Arthropathic psoriasis, unspecified: Secondary | ICD-10-CM | POA: Diagnosis not present

## 2017-03-24 DIAGNOSIS — M150 Primary generalized (osteo)arthritis: Secondary | ICD-10-CM | POA: Diagnosis not present

## 2017-03-24 DIAGNOSIS — E559 Vitamin D deficiency, unspecified: Secondary | ICD-10-CM | POA: Diagnosis not present

## 2017-03-24 DIAGNOSIS — E669 Obesity, unspecified: Secondary | ICD-10-CM | POA: Diagnosis not present

## 2017-03-24 DIAGNOSIS — Z79899 Other long term (current) drug therapy: Secondary | ICD-10-CM | POA: Diagnosis not present

## 2017-03-24 DIAGNOSIS — Z6836 Body mass index (BMI) 36.0-36.9, adult: Secondary | ICD-10-CM | POA: Diagnosis not present

## 2017-03-24 DIAGNOSIS — L401 Generalized pustular psoriasis: Secondary | ICD-10-CM | POA: Diagnosis not present

## 2017-03-24 DIAGNOSIS — R7989 Other specified abnormal findings of blood chemistry: Secondary | ICD-10-CM | POA: Diagnosis not present

## 2017-03-24 DIAGNOSIS — M79605 Pain in left leg: Secondary | ICD-10-CM | POA: Diagnosis not present

## 2017-03-26 ENCOUNTER — Other Ambulatory Visit: Payer: Self-pay | Admitting: Family Medicine

## 2017-03-26 DIAGNOSIS — E7800 Pure hypercholesterolemia, unspecified: Secondary | ICD-10-CM

## 2017-03-26 DIAGNOSIS — E119 Type 2 diabetes mellitus without complications: Secondary | ICD-10-CM

## 2017-04-15 ENCOUNTER — Other Ambulatory Visit: Payer: Self-pay | Admitting: Family Medicine

## 2017-05-06 DIAGNOSIS — I8312 Varicose veins of left lower extremity with inflammation: Secondary | ICD-10-CM | POA: Diagnosis not present

## 2017-05-06 DIAGNOSIS — D225 Melanocytic nevi of trunk: Secondary | ICD-10-CM | POA: Diagnosis not present

## 2017-05-06 DIAGNOSIS — I8311 Varicose veins of right lower extremity with inflammation: Secondary | ICD-10-CM | POA: Diagnosis not present

## 2017-05-06 DIAGNOSIS — D2362 Other benign neoplasm of skin of left upper limb, including shoulder: Secondary | ICD-10-CM | POA: Diagnosis not present

## 2017-05-06 DIAGNOSIS — L400 Psoriasis vulgaris: Secondary | ICD-10-CM | POA: Diagnosis not present

## 2017-05-06 DIAGNOSIS — I872 Venous insufficiency (chronic) (peripheral): Secondary | ICD-10-CM | POA: Diagnosis not present

## 2017-05-06 DIAGNOSIS — L821 Other seborrheic keratosis: Secondary | ICD-10-CM | POA: Diagnosis not present

## 2017-05-06 DIAGNOSIS — D1801 Hemangioma of skin and subcutaneous tissue: Secondary | ICD-10-CM | POA: Diagnosis not present

## 2017-05-26 ENCOUNTER — Other Ambulatory Visit: Payer: Self-pay | Admitting: Family Medicine

## 2017-05-26 DIAGNOSIS — L4050 Arthropathic psoriasis, unspecified: Secondary | ICD-10-CM | POA: Diagnosis not present

## 2017-05-26 DIAGNOSIS — E119 Type 2 diabetes mellitus without complications: Secondary | ICD-10-CM

## 2017-06-18 ENCOUNTER — Ambulatory Visit (INDEPENDENT_AMBULATORY_CARE_PROVIDER_SITE_OTHER): Payer: Medicare Other | Admitting: Family Medicine

## 2017-06-18 ENCOUNTER — Ambulatory Visit: Payer: Self-pay | Admitting: Surgery

## 2017-06-18 ENCOUNTER — Encounter: Payer: Self-pay | Admitting: Family Medicine

## 2017-06-18 VITALS — BP 122/66 | HR 76 | Temp 99.2°F | Ht 63.5 in | Wt 210.8 lb

## 2017-06-18 DIAGNOSIS — E1159 Type 2 diabetes mellitus with other circulatory complications: Secondary | ICD-10-CM

## 2017-06-18 DIAGNOSIS — K429 Umbilical hernia without obstruction or gangrene: Secondary | ICD-10-CM | POA: Diagnosis not present

## 2017-06-18 DIAGNOSIS — Z79899 Other long term (current) drug therapy: Secondary | ICD-10-CM | POA: Diagnosis not present

## 2017-06-18 DIAGNOSIS — I10 Essential (primary) hypertension: Secondary | ICD-10-CM | POA: Diagnosis not present

## 2017-06-18 DIAGNOSIS — R1033 Periumbilical pain: Secondary | ICD-10-CM | POA: Diagnosis not present

## 2017-06-18 DIAGNOSIS — I152 Hypertension secondary to endocrine disorders: Secondary | ICD-10-CM

## 2017-06-18 NOTE — Progress Notes (Addendum)
Chief Complaint  Patient presents with  . Hernia    possible umbilical hernia. Last week started feeling uncomfortable. Redness started about 2 days ago.    Has had umbilical hernia x 40 years.  Over the last week has developed discomfort.  She first noticed discomfort this past Friday, when driving to [place] . At the hotel she felt that there was something above the belly button, like a "stem" that was firm. Daughter noticed a bulging, could still push it in. It is less tender now.  It seems to fluctuate in size. She has been home since Monday, rested on Tuesday, laid down a lot.  Pain/swelling improved, but today noticed that it was pink overying and surrounding the umbilicus.  No lifting, coughing, straining, constipation. Denies any drainage or odor from the umbilicus. Denies nausea, vomiting, change in bowels. No change in medications--still getting Stelara injections (last in arms, or given in lower abdomen, not near umbilicus).  Diabetic, last A1c 6.6 in Feb (scheduled for f/u within the next month)  PMH, PSH, [place] reviewed  Outpatient Encounter Prescriptions as of 06/18/2017  Medication Sig Note  . atorvastatin (LIPITOR) 20 MG tablet TAKE 1 TABLET(20 MG) BY MOUTH DAILY   . CHIA SEED PO Take 15 mLs by mouth daily.   . clobetasol (TEMOVATE) 0.05 % external solution APPLY TO THE SKIN BID 04/09/2016: Received from: External Pharmacy  . EPINEPHrine (EPIPEN 2-PAK) 0.3 mg/0.3 mL IJ SOAJ injection Inject 0.3 mLs (0.3 mg total) into the muscle once.   Marland Kitchen ipratropium (ATROVENT) 0.03 % nasal spray USE 2 SPRAYS IN EACH NOSTRIL EVERY 12 HOURS   . loratadine (CLARITIN) 10 MG tablet Take 10 mg by mouth daily.   Marland Kitchen losartan (COZAAR) 25 MG tablet TAKE 1 TABLET(25 MG) BY MOUTH DAILY   . metFORMIN (GLUCOPHAGE-XR) 500 MG 24 hr tablet TAKE 3 TABLETS(1500 MG) BY MOUTH DAILY WITH BREAKFAST   . metoprolol succinate (TOPROL-XL) 25 MG 24 hr tablet Take 1 tablet (25 mg total) by mouth daily.   . ranitidine (ZANTAC)  75 MG tablet Take 75 mg by mouth 2 (two) times daily. 11/27/2016: Uses prn  . ustekinumab (STELARA) 45 MG/0.5ML SOSY injection Inject 45 mg into the skin. 12/17/2015: Next dose March 3,2017 then q 12 weeks.    No facility-administered encounter medications on file as of 06/18/2017.    Allergies  Allergen Reactions  . Sesame Oil Anaphylaxis    Sesame seed/oil  . Ace Inhibitors Cough  . Latex Rash   ROS: no fever, chills, nausea, vomiting, change in bowels. No other skin concerns/rashes, just pain, swelling, redness at the right side of umbilicus.  PHYSICAL EXAM:  BP 122/66 (BP Location: Right Arm, Patient Position: Sitting, Cuff Size: Normal)   Pulse 76   Temp 99.2 F (37.3 C) (Tympanic)   Ht 5' 3.5" (1.613 m)   Wt 210 lb 12.8 oz (95.6 kg)   BMI 36.76 kg/m    Pleasant, well-appearing female, in no distress.  Accompanied by her young grandson.  Abdomen: soft, normal bowel sounds.    WHSS midline lower abdomen extending to umbilicus. There is a vague area of faint erythema surrounding the umbilicus, measuring 11 x 8.5 cm in size. There is bulging consistent with hernia on the right side. There is erythema and induration/firmness focally at superiorly and to the right side (portion at upper part of hernia measures 1-1.5cm that is firm, mildly tender).  No fluctuance, no drainage, no pore noted.   ASSESSMENT/PLAN:  Umbilical pain -  hernia and erythema/induration. ?obstruction vs infection. Surgery to eval. ?need for imaging/procedure today  Hypertension associated with diabetes ([place]) - BP controlled; last A1c 6.6 in Feb  High risk medication use   Refer to surgeon for evaluation. Concerning for cellulitis/abscess/infection, possibly related to hernia. Pain overall has improved some, but spreading of erythema is quite concerning. She is on immunosuppressant meds. To see surgeon today. Per [facility], Dr. Harlow Asa will work her in today and call me afterwards.   Phone call update from  Dr. Harlow Asa-- Suspects a strangulated piece of omentum, with some fat necrosis. Repair as outpt in the next couple of weeks, open repair with mesh patch.

## 2017-06-21 ENCOUNTER — Other Ambulatory Visit: Payer: Self-pay | Admitting: Family Medicine

## 2017-06-21 DIAGNOSIS — I10 Essential (primary) hypertension: Secondary | ICD-10-CM

## 2017-06-23 ENCOUNTER — Encounter (HOSPITAL_BASED_OUTPATIENT_CLINIC_OR_DEPARTMENT_OTHER): Payer: Self-pay | Admitting: *Deleted

## 2017-06-24 ENCOUNTER — Other Ambulatory Visit: Payer: Self-pay

## 2017-06-24 ENCOUNTER — Encounter (HOSPITAL_BASED_OUTPATIENT_CLINIC_OR_DEPARTMENT_OTHER)
Admission: RE | Admit: 2017-06-24 | Discharge: 2017-06-24 | Disposition: A | Discharge status: Home / Self Care | Payer: Medicare Other | Source: Ambulatory Visit | Attending: Surgery | Admitting: Surgery

## 2017-06-24 DIAGNOSIS — K429 Umbilical hernia without obstruction or gangrene: Secondary | ICD-10-CM | POA: Insufficient documentation

## 2017-06-24 DIAGNOSIS — Z01810 Encounter for preprocedural cardiovascular examination: Secondary | ICD-10-CM | POA: Insufficient documentation

## 2017-06-24 LAB — BASIC METABOLIC PANEL
Anion gap: 8 (ref 5–15)
BUN: 25 mg/dL — ABNORMAL HIGH (ref 6–20)
CHLORIDE: 106 mmol/L (ref 101–111)
CO2: 25 mmol/L (ref 22–32)
CREATININE: 1.29 mg/dL — AB (ref 0.44–1.00)
Calcium: 9.5 mg/dL (ref 8.9–10.3)
GFR calc non Af Amer: 41 mL/min — ABNORMAL LOW (ref >60)
GFR, EST AFRICAN AMERICAN: 47 mL/min — AB (ref >60)
Glucose, Bld: 181 mg/dL — ABNORMAL HIGH (ref 65–99)
POTASSIUM: 4.1 mmol/L (ref 3.5–5.1)
SODIUM: 139 mmol/L (ref 135–145)

## 2017-06-25 ENCOUNTER — Encounter (HOSPITAL_BASED_OUTPATIENT_CLINIC_OR_DEPARTMENT_OTHER): Payer: Self-pay | Admitting: Surgery

## 2017-06-25 DIAGNOSIS — K429 Umbilical hernia without obstruction or gangrene: Secondary | ICD-10-CM | POA: Diagnosis present

## 2017-06-25 NOTE — H&P (Signed)
General Surgery [facility]  April Matthews DOB: 08/14/1946 Married / Language: English / Race: [place] or Other [place] Female   History of Present Illness  The patient is a 71 year old female who presents with an umbilical hernia.  CC: incarcerated umbilical hernia  Patient is referred by Dr. Rita Ohara for evaluation of umbilical hernia. Patient states that a hernia has been present for at least 40 years. It has always been soft and reducible. Over the past week she had noticed some discomfort. Hernia was no longer reducible. She has developed some hardness and some erythema at the umbilicus. She denies any fevers or chills. She denies any nausea or vomiting. She denies any significant pain. Patient is taking biologic treatment for psoriatic arthritis. She was evaluated today by her primary care physician and referred for surgical consultation. Patient has had a prior cesarean section through a low midline incision. She has had no surgical procedures through the umbilicus.   Past Surgical History Breast Biopsy  Left. Cesarean Section - 1   Diagnostic Studies History  Colonoscopy  within last year Mammogram  1-3 years ago Pap Smear  1-5 years ago  Allergies Sesame Oil *CHEMICALS*  Anaphylaxis. seeds ACE Inhibitors  Cough. Latex  Rash. Allergies Reconciled   Medication History Atorvastatin Calcium ('20MG'$  Tablet, Oral) Active. Ipratropium Bromide (0.03% Solution, Nasal) Active. Losartan Potassium ('25MG'$  Tablet, Oral) Active. MetFORMIN HCl ER ('500MG'$  Tablet ER 24HR, Oral) Active. Metoprolol Succinate ER ('25MG'$  Tablet ER 24HR, Oral) Active. Chia Seed Oil Extract ('1000MG'$  Capsule, Oral) Active. Clobetasol Prop & Cleanser (0.05% Kit, External) Active. EPINEPHrine (0.'3MG'$ /0.3ML Soln Auto-inj, Injection) Active. Claritin ('10MG'$  Capsule, Oral) Active. Zantac 75 ('75MG'$  Tablet, Oral) Active. Stelara ('45MG'$ /0.5ML Solution,  Subcutaneous) Active. Medications Reconciled  Social History  Alcohol use  Occasional alcohol use. Caffeine use  Coffee, Tea. No drug use  Tobacco use  Never smoker.  Family History  Alcohol Abuse  Father. Breast Cancer  Family Members In General. Depression  Family Members In General. Hypertension  Brother, Mother. Melanoma  Mother. Migraine Headache  Daughter. Prostate Cancer  Brother.  Pregnancy / Birth History Age at menarche  33 years. Age of menopause  51-55 Contraceptive History  Oral contraceptives. Gravida  4 Length (months) of breastfeeding  12-24 Maternal age  29-25 Para  3  Other Problems Arthritis  Diabetes Mellitus  Heart murmur  High blood pressure  Kidney Stone   Review of Systems General Not Present- Appetite Loss, Chills, Fatigue, Fever, Night Sweats, Weight Gain and Weight Loss. Skin Not Present- Change in Wart/Mole, Dryness, Hives, Jaundice, New Lesions, Non-Healing Wounds, Rash and Ulcer. HEENT Present- Seasonal Allergies and Wears glasses/contact lenses. Not Present- Earache, Hearing Loss, Hoarseness, Nose Bleed, Oral Ulcers, Ringing in the Ears, Sinus Pain, Sore Throat, Visual Disturbances and Yellow Eyes. Respiratory Present- Snoring. Not Present- Bloody sputum, Chronic Cough, Difficulty Breathing and Wheezing. Breast Not Present- Breast Mass, Breast Pain, Nipple Discharge and Skin Changes. Cardiovascular Not Present- Chest Pain, Difficulty Breathing Lying Down, Leg Cramps, Palpitations, Rapid Heart Rate, Shortness of Breath and Swelling of Extremities. Gastrointestinal Present- Excessive gas. Not Present- Abdominal Pain, Bloating, Bloody Stool, Change in Bowel Habits, Chronic diarrhea, Constipation, Difficulty Swallowing, Gets full quickly at meals, Hemorrhoids, Indigestion, Nausea, Rectal Pain and Vomiting. Female Genitourinary Not Present- Frequency, Nocturia, Painful Urination, Pelvic Pain and Urgency. Musculoskeletal Not  Present- Back Pain, Joint Pain, Joint Stiffness, Muscle Pain, Muscle Weakness and Swelling of Extremities. Neurological Not Present- Decreased Memory, Fainting, Headaches,  Numbness, Seizures, Tingling, Tremor, Trouble walking and Weakness. Psychiatric Not Present- Anxiety, Bipolar, Change in Sleep Pattern, Depression, Fearful and Frequent crying. Endocrine Not Present- Cold Intolerance, Excessive Hunger, Hair Changes, Heat Intolerance, Hot flashes and New Diabetes. Hematology Present- Easy Bruising. Not Present- Blood Thinners, Excessive bleeding, Gland problems, HIV and Persistent Infections.  Vitals Weight: 210.4 lb Height: 64in Height was reported by patient. Body Surface Area: 2 m Body Mass Index: 36.11 kg/m  Temp.: 98.44F(Oral)  Pulse: 85 (Regular)  P.OX: 91% (Room air) BP: 130/84 (Sitting, Left Arm, Standard)   Physical Exam The physical exam findings are as follows: Note:CONSTITUTIONAL See vital signs recorded above  GENERAL APPEARANCE Development: normal Nutritional status: normal Gross deformities: none  SKIN Rash, lesions, ulcers: none Induration, erythema: none Nodules: none palpable  EYES Conjunctiva and lids: normal Pupils: equal and reactive Iris: normal bilaterally  EARS, NOSE, MOUTH, THROAT External ears: no lesion or deformity External nose: no lesion or deformity Hearing: grossly normal Lips: no lesion or deformity Dentition: normal for age Oral mucosa: moist  NECK Symmetric: yes Trachea: midline Thyroid: no palpable nodules in the thyroid bed  CHEST Respiratory effort: normal Retraction or accessory muscle use: no Breath sounds: normal bilaterally Rales, rhonchi, wheeze: none  CARDIOVASCULAR Auscultation: regular rhythm, normal rate Murmurs: none Pulses: carotid and radial pulse 2+ palpable Lower extremity edema: none Lower extremity varicosities: none  ABDOMEN Distension: none Masses: none palpable Tenderness:  none Hepatosplenomegaly: not present Hernia: Induration in the umbilical skin; there is an umbilical hernia containing likely incarcerated omentum. There is no significant tenderness. There is mild overlying erythema but no sign of cellulitis. There is no fluctuance. With manipulation, the hernia is not reducible. Fascial defect is likely 1-2 cm in size. Well-healed incision lower abdominal midline without sign of herniation  MUSCULOSKELETAL Station and gait: normal Digits and nails: no clubbing or cyanosis Muscle strength: grossly normal all extremities Range of motion: grossly normal all extremities Deformity: none  LYMPHATIC Cervical: none palpable Supraclavicular: none palpable  PSYCHIATRIC Oriented to person, place, and time: yes Mood and affect: normal for situation Judgment and insight: appropriate for situation    Assessment & Plan  UMBILICAL HERNIA WITHOUT OBSTRUCTION OR GANGRENE (K42.9)  Pt Education - Pamphlet Given - Hernia Surgery: discussed with patient and provided information.  Patient presents on referral from her primary care physician for evaluation of incarcerated umbilical hernia. Patient is provided with written literature on hernia surgery to review at home.  Patient has an incarcerated umbilical hernia. I do not see any sign of infection or abscess. I think there is likely incarcerated omentum which may have strangulated and may have a small amount of fat necrosis. I do not think there is bowel present within the hernia. She has no signs or symptoms of intestinal obstruction. She has minimal tenderness.  I recommended repair of umbilical hernia with mesh patch as an outpatient surgical procedure in the near future. We discussed the risk and benefits of the surgery. We discussed the potential for recurrent hernia. We discussed restrictions on her activities after the procedure. Patient understands and wishes to proceed with surgery in the near  future.  The risks and benefits of the procedure have been discussed at length with the patient. The patient understands the proposed procedure, potential alternative treatments, and the course of recovery to be expected. All of the patient's questions have been answered at this time. The patient wishes to proceed with surgery.  Earnstine Regal, MD, [facility] Office: [phone]

## 2017-06-25 NOTE — Progress Notes (Addendum)
Labs reviewed by Dr. Lissa Hoard, pt needs to see primary care physician prior to surgery.  Notified Brenda at [facility] of lab values and pt needing medical clearance.    Addendum: 06/26/17 pt has medical clearance from [facility].  Faxed copy in pt's chart.

## 2017-06-30 ENCOUNTER — Ambulatory Visit (HOSPITAL_BASED_OUTPATIENT_CLINIC_OR_DEPARTMENT_OTHER): Payer: Medicare Other | Admitting: Anesthesiology

## 2017-06-30 ENCOUNTER — Encounter (HOSPITAL_BASED_OUTPATIENT_CLINIC_OR_DEPARTMENT_OTHER)
Admission: RE | Disposition: A | Discharge status: Home / Self Care | Payer: Self-pay | Source: Ambulatory Visit | Attending: Surgery

## 2017-06-30 ENCOUNTER — Ambulatory Visit (HOSPITAL_BASED_OUTPATIENT_CLINIC_OR_DEPARTMENT_OTHER)
Admission: RE | Admit: 2017-06-30 | Discharge: 2017-06-30 | Disposition: A | Discharge status: Home / Self Care | Payer: Medicare Other | Source: Ambulatory Visit | Attending: Surgery | Admitting: Surgery

## 2017-06-30 ENCOUNTER — Encounter (HOSPITAL_BASED_OUTPATIENT_CLINIC_OR_DEPARTMENT_OTHER): Payer: Self-pay | Admitting: Anesthesiology

## 2017-06-30 DIAGNOSIS — K429 Umbilical hernia without obstruction or gangrene: Secondary | ICD-10-CM | POA: Diagnosis present

## 2017-06-30 DIAGNOSIS — K420 Umbilical hernia with obstruction, without gangrene: Secondary | ICD-10-CM | POA: Diagnosis not present

## 2017-06-30 DIAGNOSIS — L4050 Arthropathic psoriasis, unspecified: Secondary | ICD-10-CM | POA: Insufficient documentation

## 2017-06-30 DIAGNOSIS — E119 Type 2 diabetes mellitus without complications: Secondary | ICD-10-CM | POA: Insufficient documentation

## 2017-06-30 DIAGNOSIS — M1990 Unspecified osteoarthritis, unspecified site: Secondary | ICD-10-CM | POA: Diagnosis not present

## 2017-06-30 DIAGNOSIS — Z8249 Family history of ischemic heart disease and other diseases of the circulatory system: Secondary | ICD-10-CM | POA: Diagnosis not present

## 2017-06-30 DIAGNOSIS — R0989 Other specified symptoms and signs involving the circulatory and respiratory systems: Secondary | ICD-10-CM | POA: Diagnosis not present

## 2017-06-30 DIAGNOSIS — Z79899 Other long term (current) drug therapy: Secondary | ICD-10-CM | POA: Insufficient documentation

## 2017-06-30 DIAGNOSIS — I10 Essential (primary) hypertension: Secondary | ICD-10-CM | POA: Insufficient documentation

## 2017-06-30 HISTORY — DX: Personal history of urinary calculi: Z87.442

## 2017-06-30 HISTORY — PX: INSERTION OF MESH: SHX5868

## 2017-06-30 HISTORY — DX: Unspecified osteoarthritis, unspecified site: M19.90

## 2017-06-30 HISTORY — DX: Gastro-esophageal reflux disease without esophagitis: K21.9

## 2017-06-30 HISTORY — PX: UMBILICAL HERNIA REPAIR: SHX196

## 2017-06-30 LAB — GLUCOSE, CAPILLARY
GLUCOSE-CAPILLARY: 134 mg/dL — AB (ref 65–99)
GLUCOSE-CAPILLARY: 143 mg/dL — AB (ref 65–99)

## 2017-06-30 SURGERY — REPAIR, HERNIA, UMBILICAL, ADULT
Anesthesia: General | Site: Abdomen

## 2017-06-30 MED ORDER — LACTATED RINGERS IV SOLN
INTRAVENOUS | Status: DC
  Administered 2017-06-30 (×2): via INTRAVENOUS

## 2017-06-30 MED ORDER — PROMETHAZINE HCL 25 MG/ML IJ SOLN: 6.2500 mg | INTRAMUSCULAR | Status: DC | PRN

## 2017-06-30 MED ORDER — BUPIVACAINE HCL (PF) 0.5 % IJ SOLN
INTRAMUSCULAR | Status: DC | PRN
  Administered 2017-06-30: 20 mL

## 2017-06-30 MED ORDER — HYDROMORPHONE HCL 1 MG/ML IJ SOLN
0.2500 mg | INTRAMUSCULAR | Status: DC | PRN
  Administered 2017-06-30: 0.5 mg via INTRAVENOUS

## 2017-06-30 MED ORDER — DEXAMETHASONE SODIUM PHOSPHATE 4 MG/ML IJ SOLN
INTRAMUSCULAR | Status: DC | PRN
  Administered 2017-06-30: 10 mg via INTRAVENOUS

## 2017-06-30 MED ORDER — HYDROCODONE-ACETAMINOPHEN 5-325 MG PO TABS: 1.0000 | ORAL_TABLET | ORAL | 0 refills | Status: DC | PRN

## 2017-06-30 MED ORDER — CEFAZOLIN SODIUM-DEXTROSE 2-4 GM/100ML-% IV SOLN
2.0000 g | INTRAVENOUS | Status: AC
  Administered 2017-06-30: 2 g via INTRAVENOUS

## 2017-06-30 MED ORDER — CEFAZOLIN SODIUM-DEXTROSE 2-4 GM/100ML-% IV SOLN
INTRAVENOUS | Status: AC
  Filled 2017-06-30: qty 100

## 2017-06-30 MED ORDER — FENTANYL CITRATE (PF) 100 MCG/2ML IJ SOLN: 50.0000 ug | INTRAMUSCULAR | Status: DC | PRN

## 2017-06-30 MED ORDER — ROCURONIUM BROMIDE 10 MG/ML (PF) SYRINGE
PREFILLED_SYRINGE | INTRAVENOUS | Status: AC
  Filled 2017-06-30: qty 5

## 2017-06-30 MED ORDER — ONDANSETRON HCL 4 MG/2ML IJ SOLN
INTRAMUSCULAR | Status: DC | PRN
  Administered 2017-06-30: 4 mg via INTRAVENOUS

## 2017-06-30 MED ORDER — SUCCINYLCHOLINE CHLORIDE 20 MG/ML IJ SOLN
INTRAMUSCULAR | Status: DC | PRN
  Administered 2017-06-30: 10 mg via INTRAVENOUS
  Administered 2017-06-30: 50 mg via INTRAVENOUS

## 2017-06-30 MED ORDER — PROPOFOL 10 MG/ML IV BOLUS
INTRAVENOUS | Status: DC | PRN
  Administered 2017-06-30: 150 mg via INTRAVENOUS
  Administered 2017-06-30: 50 mg via INTRAVENOUS
  Administered 2017-06-30: 40 mg via INTRAVENOUS

## 2017-06-30 MED ORDER — PROPOFOL 10 MG/ML IV BOLUS
INTRAVENOUS | Status: AC
  Filled 2017-06-30: qty 20

## 2017-06-30 MED ORDER — ONDANSETRON HCL 4 MG/2ML IJ SOLN
INTRAMUSCULAR | Status: AC
  Filled 2017-06-30: qty 2

## 2017-06-30 MED ORDER — GLYCOPYRROLATE 0.2 MG/ML IJ SOLN
INTRAMUSCULAR | Status: DC | PRN
  Administered 2017-06-30: 0.1 mg via INTRAVENOUS

## 2017-06-30 MED ORDER — BUPIVACAINE HCL (PF) 0.5 % IJ SOLN
INTRAMUSCULAR | Status: AC
  Filled 2017-06-30: qty 30

## 2017-06-30 MED ORDER — OXYCODONE HCL 5 MG/5ML PO SOLN: 5.0000 mg | Freq: Once | ORAL | Status: DC | PRN

## 2017-06-30 MED ORDER — MIDAZOLAM HCL 2 MG/2ML IJ SOLN: 1.0000 mg | INTRAMUSCULAR | Status: DC | PRN

## 2017-06-30 MED ORDER — FENTANYL CITRATE (PF) 100 MCG/2ML IJ SOLN
INTRAMUSCULAR | Status: AC
  Filled 2017-06-30: qty 2

## 2017-06-30 MED ORDER — OXYCODONE HCL 5 MG PO TABS: 5.0000 mg | ORAL_TABLET | Freq: Once | ORAL | Status: DC | PRN

## 2017-06-30 MED ORDER — FENTANYL CITRATE (PF) 100 MCG/2ML IJ SOLN
INTRAMUSCULAR | Status: DC | PRN
  Administered 2017-06-30 (×3): 50 ug via INTRAVENOUS

## 2017-06-30 MED ORDER — HYDROMORPHONE HCL 1 MG/ML IJ SOLN
INTRAMUSCULAR | Status: AC
  Filled 2017-06-30: qty 0.5

## 2017-06-30 MED ORDER — EPHEDRINE SULFATE 50 MG/ML IJ SOLN
INTRAMUSCULAR | Status: DC | PRN
  Administered 2017-06-30 (×2): 25 mg via INTRAVENOUS

## 2017-06-30 MED ORDER — CHLORHEXIDINE GLUCONATE CLOTH 2 % EX PADS: 6.0000 | MEDICATED_PAD | Freq: Once | CUTANEOUS | Status: DC

## 2017-06-30 MED ORDER — SCOPOLAMINE 1 MG/3DAYS TD PT72: 1.0000 | MEDICATED_PATCH | Freq: Once | TRANSDERMAL | Status: DC | PRN

## 2017-06-30 MED ORDER — LIDOCAINE 2% (20 MG/ML) 5 ML SYRINGE
INTRAMUSCULAR | Status: AC
  Filled 2017-06-30: qty 5

## 2017-06-30 MED ORDER — SUGAMMADEX SODIUM 200 MG/2ML IV SOLN
INTRAVENOUS | Status: AC
  Filled 2017-06-30: qty 2

## 2017-06-30 MED ORDER — DEXAMETHASONE SODIUM PHOSPHATE 10 MG/ML IJ SOLN
INTRAMUSCULAR | Status: AC
  Filled 2017-06-30: qty 1

## 2017-06-30 MED ORDER — LIDOCAINE HCL (CARDIAC) 20 MG/ML IV SOLN
INTRAVENOUS | Status: DC | PRN
  Administered 2017-06-30: 30 mg via INTRAVENOUS

## 2017-06-30 SURGICAL SUPPLY — 38 items
BALL CTTN LRG ABS STRL LF (GAUZE/BANDAGES/DRESSINGS) ×1
BENZOIN TINCTURE PRP APPL 2/3 (GAUZE/BANDAGES/DRESSINGS) ×3 IMPLANT
BLADE CLIPPER SURG (BLADE) IMPLANT
BLADE SURG 15 STRL LF DISP TIS (BLADE) ×1 IMPLANT
BLADE SURG 15 STRL SS (BLADE) ×3
CHLORAPREP W/TINT 26ML (MISCELLANEOUS) ×3 IMPLANT
CLOSURE WOUND 1/2 X4 (GAUZE/BANDAGES/DRESSINGS) ×1
COTTONBALL LRG STERILE PKG (GAUZE/BANDAGES/DRESSINGS) ×3 IMPLANT
COVER BACK TABLE 60X90IN (DRAPES) ×3 IMPLANT
COVER MAYO STAND STRL (DRAPES) ×3 IMPLANT
DECANTER SPIKE VIAL GLASS SM (MISCELLANEOUS) ×3 IMPLANT
DRAPE LAPAROTOMY 100X72 PEDS (DRAPES) ×3 IMPLANT
DRAPE UTILITY XL STRL (DRAPES) ×3 IMPLANT
ELECT REM PT RETURN 9FT ADLT (ELECTROSURGICAL) ×3
ELECTRODE REM PT RTRN 9FT ADLT (ELECTROSURGICAL) ×1 IMPLANT
GAUZE SPONGE 4X4 12PLY STRL LF (GAUZE/BANDAGES/DRESSINGS) ×3 IMPLANT
GAUZE SPONGE 4X4 16PLY XRAY LF (GAUZE/BANDAGES/DRESSINGS) IMPLANT
GLOVE SURG ORTHO 8.0 STRL STRW (GLOVE) ×3 IMPLANT
GOWN STRL REUS W/ TWL LRG LVL3 (GOWN DISPOSABLE) ×1 IMPLANT
GOWN STRL REUS W/ TWL XL LVL3 (GOWN DISPOSABLE) ×1 IMPLANT
GOWN STRL REUS W/TWL LRG LVL3 (GOWN DISPOSABLE) ×3
GOWN STRL REUS W/TWL XL LVL3 (GOWN DISPOSABLE) ×3
MESH VENTRALEX ST 2.5 CRC MED (Mesh General) ×3 IMPLANT
NEEDLE HYPO 25X1 1.5 SAFETY (NEEDLE) ×3 IMPLANT
NS IRRIG 1000ML POUR BTL (IV SOLUTION) ×3 IMPLANT
PACK BASIN DAY SURGERY FS (CUSTOM PROCEDURE TRAY) ×3 IMPLANT
PENCIL BUTTON HOLSTER BLD 10FT (ELECTRODE) ×3 IMPLANT
SLEEVE SCD COMPRESS KNEE MED (MISCELLANEOUS) ×3 IMPLANT
STRIP CLOSURE SKIN 1/2X4 (GAUZE/BANDAGES/DRESSINGS) ×2 IMPLANT
SUT ETHIBOND 0 MO6 C/R (SUTURE) IMPLANT
SUT MNCRL AB 4-0 PS2 18 (SUTURE) ×3 IMPLANT
SUT NOVA 0 T19/GS 22DT (SUTURE) ×3 IMPLANT
SUT VICRYL 3-0 CR8 SH (SUTURE) ×3 IMPLANT
SUT VICRYL 4-0 PS2 18IN ABS (SUTURE) IMPLANT
SYR BULB 3OZ (MISCELLANEOUS) IMPLANT
SYR CONTROL 10ML LL (SYRINGE) ×3 IMPLANT
TOWEL OR 17X24 6PK STRL BLUE (TOWEL DISPOSABLE) ×6 IMPLANT
TOWEL OR NON WOVEN STRL DISP B (DISPOSABLE) ×3 IMPLANT

## 2017-06-30 NOTE — Discharge Instructions (Signed)

## 2017-06-30 NOTE — Transfer of Care (Signed)
Immediate Anesthesia Transfer of Care Note  Patient: April Matthews  Procedure(s) Performed: Procedure(s): HERNIA REPAIR UMBILICAL (N/A) INSERTION OF MESH PATCH (N/A)  Patient Location: PACU  Anesthesia Type:General  Level of Consciousness: sedated  Airway & Oxygen Therapy: Patient Spontanous Breathing and Patient connected to face mask oxygen  Post-op Assessment: Report given to RN and Post -op Vital signs reviewed and stable  Post vital signs: Reviewed and stable  Last Vitals:  Vitals:   06/30/17 1057  BP: (!) 142/60  Pulse: 81  Resp: 18  Temp: 36.8 C  SpO2: 99%    Last Pain:  Vitals:   06/30/17 1057  TempSrc: Oral         Complications: No apparent anesthesia complications

## 2017-06-30 NOTE — Interval H&P Note (Signed)
History and Physical Interval Note:  06/30/2017 11:27 AM  April Matthews  has presented today for surgery, with the diagnosis of umbilical hernia  The various methods of treatment have been discussed with the patient and family. After consideration of risks, benefits and other options for treatment, the patient has consented to    Procedure(s): HERNIA REPAIR UMBILICAL (N/A) INSERTION OF MESH PATCH (N/A) as a surgical intervention .    The patient's history has been reviewed, patient examined, no change in status, stable for surgery.  I have reviewed the patient's chart and labs.  Questions were answered to the patient's satisfaction.    Earnstine Regal, MD, [facility] Office: [place]

## 2017-06-30 NOTE — Anesthesia Postprocedure Evaluation (Signed)
Anesthesia Post Note  Patient: Teyanna Thielman  Procedure(s) Performed: Procedure(s) (LRB): HERNIA REPAIR UMBILICAL (N/A) INSERTION OF MESH PATCH (N/A)     Patient location during evaluation: PACU Anesthesia Type: General Level of consciousness: awake and alert Pain management: pain level controlled Vital Signs Assessment: post-procedure vital signs reviewed and stable Respiratory status: spontaneous breathing, nonlabored ventilation and respiratory function stable Cardiovascular status: blood pressure returned to baseline and stable Postop Assessment: no signs of nausea or vomiting Anesthetic complications: no    Last Vitals:  Vitals:   06/30/17 1330 06/30/17 1345  BP: 135/73 135/69  Pulse: 77 72  Resp: 19 12  Temp:    SpO2: 92% 94%    Last Pain:  Vitals:   06/30/17 1345  TempSrc:   PainSc: 2                  Lynda Rainwater

## 2017-06-30 NOTE — Anesthesia Procedure Notes (Signed)
Procedure Name: Intubation Date/Time: 06/30/2017 12:01 PM Performed by: Toula Moos L Pre-anesthesia Checklist: Patient identified, Emergency Drugs available, Suction available, Patient being monitored and Timeout performed Patient Re-evaluated:Patient Re-evaluated prior to induction Oxygen Delivery Method: Circle system utilized Preoxygenation: Pre-oxygenation with 100% oxygen Induction Type: IV induction Ventilation: Mask ventilation without difficulty Laryngoscope Size: Mac and 3 Grade View: Grade III Tube type: Oral Number of attempts: 2 Airway Equipment and Method: Stylet and Oral airway Placement Confirmation: ETT inserted through vocal cords under direct vision,  positive ETCO2 and breath sounds checked- equal and bilateral Secured at: 21 cm Tube secured with: Tape Dental Injury: Teeth and Oropharynx as per pre-operative assessment  Difficulty Due To: Difficult Airway- due to large tongue and Difficult Airway- due to reduced neck mobility

## 2017-06-30 NOTE — Brief Op Note (Signed)
06/30/2017  12:51 PM  PATIENT:  April Matthews  71 y.o. female  PRE-OPERATIVE DIAGNOSIS:  umbilical hernia  POST-OPERATIVE DIAGNOSIS:  umbilical hernia  PROCEDURE:  Procedure(s): HERNIA REPAIR UMBILICAL (N/A) INSERTION OF MESH PATCH (N/A)  SURGEON:  Surgeon(s) and Role:    * Armandina Gemma, MD - Primary  ANESTHESIA:   general  EBL:  Total I/O In: 1600 [I.V.:1600] Out: 12 [Blood:12]  BLOOD ADMINISTERED:none  DRAINS: none   LOCAL MEDICATIONS USED:  MARCAINE     SPECIMEN:  No Specimen  DISPOSITION OF SPECIMEN:  N/A  COUNTS:  YES  TOURNIQUET:  * No tourniquets in log *  DICTATION: .Other Dictation: Dictation Number [id number]  PLAN OF CARE: Discharge to home after PACU  PATIENT DISPOSITION:  PACU - hemodynamically stable.   Delay start of Pharmacological VTE agent (>24hrs) due to surgical blood loss or risk of bleeding: yes  Earnstine Regal, MD, [facility] Office: [phone]

## 2017-06-30 NOTE — Anesthesia Preprocedure Evaluation (Signed)
Anesthesia Evaluation  Patient identified by MRN, date of birth, ID band Patient awake    Reviewed: Allergy & Precautions, NPO status , Patient's Chart, lab work & pertinent test results, reviewed documented beta blocker date and time   Airway Mallampati: II  TM Distance: >3 FB Neck ROM: Full    Dental no notable dental hx.    Pulmonary neg pulmonary ROS,    Pulmonary exam normal breath sounds clear to auscultation       Cardiovascular hypertension, Pt. on medications and Pt. on home beta blockers negative cardio ROS Normal cardiovascular exam Rhythm:Regular Rate:Normal     Neuro/Psych negative neurological ROS  negative psych ROS   GI/Hepatic negative GI ROS, Neg liver ROS, GERD  ,  Endo/Other  negative endocrine ROSdiabetes  Renal/GU Renal InsufficiencyRenal diseasenegative Renal ROS  negative genitourinary   Musculoskeletal negative musculoskeletal ROS (+) Arthritis , Osteoarthritis,    Abdominal   Peds negative pediatric ROS (+)  Hematology negative hematology ROS (+)   Anesthesia Other Findings   Reproductive/Obstetrics negative OB ROS                             Anesthesia Physical Anesthesia Plan  ASA: III  Anesthesia Plan: General   Post-op Pain Management:    Induction: Intravenous  PONV Risk Score and Plan: 3 and Ondansetron, Dexamethasone, Midazolam and Treatment may vary due to age or medical condition  Airway Management Planned: Oral ETT  Additional Equipment:   Intra-op Plan:   Post-operative Plan: Extubation in OR  Informed Consent: I have reviewed the patients History and Physical, chart, labs and discussed the procedure including the risks, benefits and alternatives for the proposed anesthesia with the patient or authorized representative who has indicated his/her understanding and acceptance.   Dental advisory given  Plan Discussed with:  CRNA  Anesthesia Plan Comments:         Anesthesia Quick Evaluation

## 2017-07-01 ENCOUNTER — Encounter (HOSPITAL_BASED_OUTPATIENT_CLINIC_OR_DEPARTMENT_OTHER): Payer: Self-pay | Admitting: Surgery

## 2017-07-01 NOTE — Op Note (Signed)
NAME:  April Matthews, April Matthews                     ACCOUNT NO.:  MEDICAL RECORD NO.:  [id number]  LOCATION:                                 FACILITY:  PHYSICIAN:  Earnstine Regal, MD           DATE OF BIRTH:  DATE OF PROCEDURE:  06/30/2017                               OPERATIVE REPORT   PREOPERATIVE DIAGNOSIS:  Umbilical hernia.  POSTOPERATIVE DIAGNOSIS:  Umbilical hernia.  PROCEDURE:  Repair of umbilical hernia with mesh patch.  SURGEON:  Earnstine Regal, M.D.  ANESTHESIA:  General.  ESTIMATED BLOOD LOSS:  Minimal.  PREPARATION:  ChloraPrep.  COMPLICATIONS:  None.  INDICATIONS:  The patient is a 71 year old female referred by her primary care physician for evaluation of incarcerated umbilical hernia, which was symptomatic.  She now comes to Surgery for repair.  DESCRIPTION OF PROCEDURE:  Procedure was done in OR #8 at the [facility].  The patient was brought to the operating room, placed in supine position on the operating room table.  Following administration of general anesthesia, the patient was positioned and then prepped and draped in the usual aseptic fashion.  After ascertaining that an adequate level of anesthesia had been achieved, an infraumbilical incision was made with #15 blade.  Dissection was carried through subcutaneous tissues.  The fascial plane was developed around the hernia defect.  Hernia sac was opened and contains incarcerated omentum.  Omentum was mobilized and reduced back within the peritoneal cavity.  Hernia defect was defined circumferentially.  Umbilical skin was elevated completely off the abdominal wall.  Using a Ventralex ST 6.4 cm mesh patch, the mesh was prepared and inserted into the preperitoneal space.  It was deployed circumferentially.  It was secured to the fascial edges with interrupted 0 Novafil simple sutures, which were used to incorporate the mesh and close the fascial defect.  Local field block was placed in the  fascia circumferentially.  The umbilicus was reaffixed to the abdominal wall with an interrupted 0 Novafil suture.  Subcutaneous tissues were closed with interrupted 3-0 Vicryl sutures.  Skin edges were anesthetized with local anesthetic.  Skin edges were reapproximated with a running 4-0 Monocryl subcuticular suture.  Wound was washed and dried.  Steri-Strips were applied.  Sterile dressings were applied.  The patient was awakened from anesthesia and brought to the recovery room.  The patient tolerated the procedure well.  Earnstine Regal, MD, [facility] Office: [phone]   TMG/MEDQ  D:  06/30/2017  T:  06/30/2017  Job:  [id number]  cc:   Earnstine Regal, MD

## 2017-07-13 ENCOUNTER — Ambulatory Visit (INDEPENDENT_AMBULATORY_CARE_PROVIDER_SITE_OTHER): Payer: Medicare Other | Admitting: Family Medicine

## 2017-07-13 ENCOUNTER — Encounter: Payer: Self-pay | Admitting: Family Medicine

## 2017-07-13 VITALS — BP 118/74 | HR 80 | Ht 63.5 in | Wt 206.4 lb

## 2017-07-13 DIAGNOSIS — E7800 Pure hypercholesterolemia, unspecified: Secondary | ICD-10-CM | POA: Diagnosis not present

## 2017-07-13 DIAGNOSIS — E1159 Type 2 diabetes mellitus with other circulatory complications: Secondary | ICD-10-CM | POA: Diagnosis not present

## 2017-07-13 DIAGNOSIS — I10 Essential (primary) hypertension: Secondary | ICD-10-CM

## 2017-07-13 DIAGNOSIS — Z5181 Encounter for therapeutic drug level monitoring: Secondary | ICD-10-CM | POA: Diagnosis not present

## 2017-07-13 DIAGNOSIS — E119 Type 2 diabetes mellitus without complications: Secondary | ICD-10-CM

## 2017-07-13 DIAGNOSIS — Z23 Encounter for immunization: Secondary | ICD-10-CM

## 2017-07-13 LAB — COMPREHENSIVE METABOLIC PANEL
AG Ratio: 1.7 (calc) (ref 1.0–2.5)
ALKALINE PHOSPHATASE (APISO): 68 U/L (ref 33–130)
ALT: 17 U/L (ref 6–29)
AST: 15 U/L (ref 10–35)
Albumin: 4.3 g/dL (ref 3.6–5.1)
BUN/Creatinine Ratio: 22 (calc) (ref 6–22)
BUN: 25 mg/dL (ref 7–25)
CO2: 28 mmol/L (ref 20–32)
CREATININE: 1.13 mg/dL — AB (ref 0.60–0.93)
Calcium: 9.7 mg/dL (ref 8.6–10.4)
Chloride: 106 mmol/L (ref 98–110)
Globulin: 2.6 g/dL (calc) (ref 1.9–3.7)
Glucose, Bld: 142 mg/dL — ABNORMAL HIGH (ref 65–99)
Potassium: 4.4 mmol/L (ref 3.5–5.3)
Sodium: 140 mmol/L (ref 135–146)
Total Bilirubin: 0.3 mg/dL (ref 0.2–1.2)
Total Protein: 6.9 g/dL (ref 6.1–8.1)

## 2017-07-13 LAB — POCT GLYCOSYLATED HEMOGLOBIN (HGB A1C): HEMOGLOBIN A1C: 7.1

## 2017-07-13 MED ORDER — ATORVASTATIN CALCIUM 20 MG PO TABS: ORAL_TABLET | ORAL | 1 refills | Status: DC

## 2017-07-13 NOTE — Progress Notes (Signed)
Chief Complaint  Patient presents with  . Diabetes    nonfasting med check.    She had umbilical hernia repair last week by Dr. Harlow Asa. Hasn't needed any pain medications. Denies any complications or problems. She finally has "an inny".  Hyperlipidemia: Tolerating atorvastatin without side effects.Lipids prior to starting were: RecentLabs       Lab Results  Component Value Date   CHOL 187 12/17/2015   HDL 42 (L) 12/17/2015   LDLCALC 101 12/17/2015   TRIG 218 (H) 12/17/2015   CHOLHDL 4.5 12/17/2015      Most recent lipids were: Lab Results  Component Value Date   CHOL 112 11/27/2016   HDL 45 (L) 11/27/2016   LDLCALC 37 11/27/2016   TRIG 148 11/27/2016   CHOLHDL 2.5 11/27/2016   Hypertension: Losartan had been stopped due to low BP's with remicade infusions a while ago; this was restarted at her last physical (11/2016), when higher blood pressures were noted.  Atenolol was changed to metoprolol (due to atenolol not being available). She had been on atenolol for a very long time (she believes started for palpitations/irregular heartbeat, many years ago). She hasn't been checking blood pressure regularly, but has been fine at other doctor visits. She denies headaches, dizziness, chest pain, palpitations.  Diabetes: She doesn't check blood sugars. Her metformin was increased to 1039m daily at her visit in February 2017, after A1c rose to 7.6. She denies side effects. A1c in June was much improved, at 6.7, but crept up to 7.0 in September 2017. Metformin dose was increased to 15046mat that time. A1c had improved to 6.6%. Due for A1c today.  She is tolerating this dose without side effects. She continues to take 2 in the morning and 1 at night (bedtime).  She has been much less active recently, related to hernia and surgery.  Denies any numbness or tingling.  Last eye exam was in the Spring. Has f/u with Dr. BrRicki Millerext month (Dr. StKathrin Penners her regular ophtho).  Checks feet regularly.  Lab Results  Component Value Date   HGBA1C 6.6% 11/27/2016    Obesity: She continues to lose weight slowly.  Limited exercise in the last month due to the hernia and surgery. She has an exercise bike at home and plans to start using that (usually is active in the pool, but not able to post-op).  Psoriatic arthritis:  Under the care of Dr. BeAmil AmenDoing well on her current regimen (Stelara).  Doing well.  Small area on her skin on her back, that got much better after her recent surgery.  PMH, PSH, SH reviewed  Outpatient Encounter Prescriptions as of 07/13/2017  Medication Sig Note  . Ascorbic Acid (VITA-C PO) Take by mouth daily.   . Marland Kitchentorvastatin (LIPITOR) 20 MG tablet TAKE 1 TABLET(20 MG) BY MOUTH DAILY   . BIOTIN PO Take by mouth.   . CHROMIUM PO Take by mouth.   . COPPER PO Take by mouth.   . Cyanocobalamin (VITAMIN B12 PO) Take by mouth.   . FOLIC ACID PO Take by mouth.   . IODINE, KELP, PO Take by mouth.   . Marland Kitchenpratropium (ATROVENT) 0.03 % nasal spray USE 2 SPRAYS IN EACH NOSTRIL EVERY 12 HOURS   . IRON PO Take by mouth.   . losartan (COZAAR) 25 MG tablet TAKE 1 TABLET(25 MG) BY MOUTH DAILY   . LUTEIN PO Take by mouth.   . Marland KitchenYCOPENE PO Take by mouth.   . Magnesium 125 MG  CAPS Take by mouth.   Marland Kitchen MANGANESE PO Take by mouth.   . metFORMIN (GLUCOPHAGE-XR) 500 MG 24 hr tablet TAKE 3 TABLETS(1500 MG) BY MOUTH DAILY WITH BREAKFAST   . metoprolol succinate (TOPROL-XL) 25 MG 24 hr tablet TAKE 1 TABLET(25 MG) BY MOUTH DAILY   . NIACIN PO Take by mouth.   . Omega-3 Fatty Acids (OMEGA-3 EPA FISH OIL PO) Take by mouth 2 (two) times daily.   . Pyridoxine HCl (VITAMIN B-6 PO) Take by mouth.   Marland Kitchen RIBOFLAVIN PO Take by mouth.   . SELENIUM PO Take by mouth.   . Thiamine HCl (THIAMINE PO) Take by mouth.   . ustekinumab (STELARA) 45 MG/0.5ML SOSY injection Inject 45 mg into the skin. 06/23/2017: Due 08/18/17  . VITAMIN A PO Take 2,000 Int'l Units by mouth daily.   Marland Kitchen  VITAMIN E PO Take 60 Int'l Units by mouth daily.   Marland Kitchen VITAMIN K PO Take by mouth.   . Zinc Sulfate (ZINC 15 PO) Take by mouth.   . calcium carbonate (TUMS EX) 750 MG chewable tablet Chew 1 tablet by mouth 2 (two) times daily. 07/13/2017: Was taking 1 at bedtime until last week  . Calcium-Magnesium-Vitamin D (CALCIUM 500 PO) Take by mouth.   . Cholecalciferol (VITAMIN D3 PO) Take 800 Int'l Units by mouth 2 (two) times daily.   . clobetasol (TEMOVATE) 0.05 % external solution APPLY TO THE SKIN BID 04/09/2016: Received from: External Pharmacy  . EPINEPHrine (EPIPEN 2-PAK) 0.3 mg/0.3 mL IJ SOAJ injection Inject 0.3 mLs (0.3 mg total) into the muscle once. (Patient not taking: Reported on 07/13/2017) 06/23/2017: Carries but has not had to use it.  Marland Kitchen HYDROcodone-acetaminophen (NORCO/VICODIN) 5-325 MG tablet Take 1-2 tablets by mouth every 4 (four) hours as needed for moderate pain. (Patient not taking: Reported on 07/13/2017)   . loratadine (CLARITIN) 10 MG tablet Take 10 mg by mouth daily.   . ranitidine (ZANTAC) 75 MG tablet Take 75 mg by mouth 2 (two) times daily. 11/27/2016: Uses prn  . simethicone (MYLICON) 80 MG chewable tablet Chew 80 mg by mouth as needed for flatulence.    No facility-administered encounter medications on file as of 07/13/2017.    Duterra multivamin/minerals which includes fish oil. She isn't taking these vitamins separately.  Allergies  Allergen Reactions  . Sesame Oil Anaphylaxis    Sesame seed/oil  . Ace Inhibitors Cough  . Latex Rash    ROS: no fever, chills, URI symptoms, chest pain, shortness of breath. Psoriatic skin lesions improving, joint pain controlled. Having some loose stools develop recently, shortly after breakfast  PHYSICAL EXAM:  BP 118/74 (BP Location: Left Arm, Patient Position: Sitting, Cuff Size: Normal)   Pulse 80   Ht 5' 3.5" (1.613 m)   Wt 206 lb 6.4 oz (93.6 kg)   BMI 35.99 kg/m   Wt Readings from Last 3 Encounters:  07/13/17 206 lb 6.4 oz  (93.6 kg)  06/30/17 207 lb (93.9 kg)  06/18/17 210 lb 12.8 oz (95.6 kg)   Pleasant, well-appearing female in no distress HEENT: conjunctiva and sclera are clear, EOMI Neck: no lymphadenopathy, thyromegaly or carotid bruit Heart: regular rate and rhythm Lungs: clear bilaterally Back: no spinal or CVA tenderness Abdomen: healing scar (some scabs remain) horizontally below the umbilicus.  Minimal yellow bruising peri-umbically.  Nontender, soft. No organomegaly or mass Extremities: no edema, normal pulses Skin: normal turgor. Visualized portions were normal. Psych: normal mood, affect, hygiene and grooming Neuro: alert and oriented, cranial  nerves intact, normal gait   Lab Results  Component Value Date   HGBA1C 7.1 07/13/2017     ASSESSMENT/PLAN:  Hypertension associated with diabetes ([place]) - well controlled on current regimen  Controlled type 2 diabetes mellitus without complication, without long-term current use of insulin ([facility]) - A1c now over 7, likely related to change in activity. Encouraged further weight loss, daily exercise proper diet. Cont current meds - Plan: HgB A1c, Comprehensive metabolic panel  Pure hypercholesterolemia - continue statin for primary prevention - Plan: atorvastatin (LIPITOR) 20 MG tablet  Medication monitoring encounter - Plan: Comprehensive metabolic panel  Need for influenza vaccination - Plan: Flu vaccine HIGH DOSE PF (Fluzone High dose)   Flu shot given shingrix recommended when available   c-met and A1c today (lipids can be done yeary at CPE's)  Plans to ride bike; discussed recs for exercise, encouraged to stay motivated through the winter once her pool closes-encouraged exercise bike.     Try and change the timing of the metformin to taking it with (or just prior) your meals--breakfast and dinner. Please work harder on limiting carbs and sugar in the diet, portion control, to also help with weight loss, and get daily exercise. These  things should help get the A1c under 7.  If it doesn't, additional diabetes medications will be needed.  Try and make sure that you are not exceeding 1200-1558m of calcium on a daily basis between your supplements/vitamins/Tums and what you get from your diet (yogurt, milk, cheese, greens, etc).  Pay attention to the foods you are eating to see if there is one that triggers your loose stools after breakfast.  If not related to the food, see if it could be from the vitamins.  If you notice blood in the stool, weight loss, or abdominal pain developing, or fever, please return to get this checked out.   F/u 6 mos for AWV/med check, sooner prn. 25-30  min visit, more than 1/2 spent counseling

## 2017-07-13 NOTE — Patient Instructions (Signed)
  Try and change the timing of the metformin to taking it with (or just prior) your meals--breakfast and dinner. Please work harder on limiting carbs and sugar in the diet, portion control, to also help with weight loss, and get daily exercise. These things should help get the A1c under 7.  If it doesn't, additional diabetes medications will be needed.  Try and make sure that you are not exceeding 1200-1500mg  of calcium on a daily basis between your supplements/vitamins/Tums and what you get from your diet (yogurt, milk, cheese, greens, etc).  Pay attention to the foods you are eating to see if there is one that triggers your loose stools after breakfast.  If not related to the food, see if it could be from the vitamins.  If you notice blood in the stool, weight loss, or abdominal pain developing, or fever, please return to get this checked out.  I recommend getting the new shingles vaccine (Shingrix). You will need to check with your insurance to see if it is covered, and if covered by Medicare Part D, you need to get from the pharmacy rather than our office.  It is a series of 2 injections, spaced 2 months apart.

## 2017-08-12 DIAGNOSIS — H43811 Vitreous degeneration, right eye: Secondary | ICD-10-CM | POA: Diagnosis not present

## 2017-08-12 DIAGNOSIS — E119 Type 2 diabetes mellitus without complications: Secondary | ICD-10-CM | POA: Diagnosis not present

## 2017-08-18 DIAGNOSIS — L4050 Arthropathic psoriasis, unspecified: Secondary | ICD-10-CM | POA: Diagnosis not present

## 2017-08-25 ENCOUNTER — Telehealth: Payer: Self-pay | Admitting: Family Medicine

## 2017-08-25 NOTE — Telephone Encounter (Signed)
Called and informed pt of this  , she also had spoke with dr.Knapp.

## 2017-08-25 NOTE — Telephone Encounter (Signed)
Spoke with patient and answered questions.  She is immunocompromised due to her medications, at risk for potentially getting chicken pox from exposure to husband.  His rash is on his abdomen, started last night, has been covered.  We discussed s/sx chicken pox, and if anything develops we will start antiviral meds like Valtrex ASAP, but will hold off on starting them preventatively.  Encouraged her to get Shingrix when it is available at pharmacy.

## 2017-08-25 NOTE — Telephone Encounter (Signed)
Patient called her husband was diagnosed with shingles today and his doctor told her to contact you since they are contagious and she has not had vaccine   Please call patient   Advised patient that I would send to Dr. Tomi Bamberger and Dr. Redmond School to make sure it was seen by a provider today

## 2017-08-25 NOTE — Telephone Encounter (Signed)
Explain that she is only contagious to people who have never had chickenpox. She is not at risk for getting shingles or chickenpox. Do recommend that she get the Shingrix vaccine at her convenience.

## 2017-08-26 ENCOUNTER — Other Ambulatory Visit: Payer: Self-pay | Admitting: Family Medicine

## 2017-08-26 DIAGNOSIS — E119 Type 2 diabetes mellitus without complications: Secondary | ICD-10-CM

## 2017-09-14 ENCOUNTER — Other Ambulatory Visit: Payer: Self-pay | Admitting: Family Medicine

## 2017-09-14 DIAGNOSIS — I10 Essential (primary) hypertension: Principal | ICD-10-CM

## 2017-09-14 DIAGNOSIS — E1159 Type 2 diabetes mellitus with other circulatory complications: Secondary | ICD-10-CM

## 2017-09-24 DIAGNOSIS — E559 Vitamin D deficiency, unspecified: Secondary | ICD-10-CM | POA: Diagnosis not present

## 2017-09-24 DIAGNOSIS — M150 Primary generalized (osteo)arthritis: Secondary | ICD-10-CM | POA: Diagnosis not present

## 2017-09-24 DIAGNOSIS — E669 Obesity, unspecified: Secondary | ICD-10-CM | POA: Diagnosis not present

## 2017-09-24 DIAGNOSIS — R7989 Other specified abnormal findings of blood chemistry: Secondary | ICD-10-CM | POA: Diagnosis not present

## 2017-09-24 DIAGNOSIS — Z6837 Body mass index (BMI) 37.0-37.9, adult: Secondary | ICD-10-CM | POA: Diagnosis not present

## 2017-09-24 DIAGNOSIS — Z79899 Other long term (current) drug therapy: Secondary | ICD-10-CM | POA: Diagnosis not present

## 2017-09-24 DIAGNOSIS — L401 Generalized pustular psoriasis: Secondary | ICD-10-CM | POA: Diagnosis not present

## 2017-09-24 DIAGNOSIS — M79605 Pain in left leg: Secondary | ICD-10-CM | POA: Diagnosis not present

## 2017-09-24 DIAGNOSIS — L4050 Arthropathic psoriasis, unspecified: Secondary | ICD-10-CM | POA: Diagnosis not present

## 2017-09-28 ENCOUNTER — Other Ambulatory Visit: Payer: Self-pay | Admitting: Family Medicine

## 2017-10-26 ENCOUNTER — Other Ambulatory Visit: Payer: Self-pay | Admitting: Family Medicine

## 2017-10-26 DIAGNOSIS — Z1231 Encounter for screening mammogram for malignant neoplasm of breast: Secondary | ICD-10-CM

## 2017-11-10 DIAGNOSIS — L4050 Arthropathic psoriasis, unspecified: Secondary | ICD-10-CM | POA: Diagnosis not present

## 2017-11-18 ENCOUNTER — Ambulatory Visit
Admission: RE | Admit: 2017-11-18 | Discharge: 2017-11-18 | Disposition: A | Discharge status: Home / Self Care | Payer: Medicare Other | Source: Ambulatory Visit | Attending: Family Medicine | Admitting: Family Medicine

## 2017-11-18 DIAGNOSIS — Z1231 Encounter for screening mammogram for malignant neoplasm of breast: Secondary | ICD-10-CM | POA: Diagnosis not present

## 2017-11-18 DIAGNOSIS — M85852 Other specified disorders of bone density and structure, left thigh: Secondary | ICD-10-CM | POA: Diagnosis not present

## 2017-11-18 DIAGNOSIS — Z780 Asymptomatic menopausal state: Secondary | ICD-10-CM

## 2017-11-18 DIAGNOSIS — M8580 Other specified disorders of bone density and structure, unspecified site: Secondary | ICD-10-CM

## 2017-12-04 ENCOUNTER — Other Ambulatory Visit: Payer: Self-pay | Admitting: Family Medicine

## 2017-12-04 DIAGNOSIS — E119 Type 2 diabetes mellitus without complications: Secondary | ICD-10-CM

## 2017-12-15 ENCOUNTER — Telehealth: Payer: Self-pay | Admitting: Internal Medicine

## 2017-12-15 NOTE — Telephone Encounter (Signed)
error 

## 2018-01-04 ENCOUNTER — Other Ambulatory Visit: Payer: Self-pay | Admitting: Family Medicine

## 2018-01-04 DIAGNOSIS — I10 Essential (primary) hypertension: Secondary | ICD-10-CM

## 2018-02-08 DIAGNOSIS — L4050 Arthropathic psoriasis, unspecified: Secondary | ICD-10-CM | POA: Diagnosis not present

## 2018-02-24 NOTE — Progress Notes (Signed)
Chief Complaint  Patient presents with  . Medicare Wellness    fasting AWV with pelvic (last 2015). No concerns.     April Matthews is a 72 y.o. female who presents for annual wellness visit and follow-up on chronic medical conditions.   Obesity:  Exercising 6 days/week, using exercise bike 40-60 mins.  She hasn't opened her pool yet. Lost 12# since 3/15 doing a fasting diet ([place] Diabetes Code).  Eats only between 2-8pm.  Has a lot of energy, feels great. She reports her starting weight was 213#, and was 201# today at home. Doesn't feel hungry.  Hyperlipidemia: Tolerating atorvastatin without side effects. Trying to follow lowfat, low cholesterol diet. Lab Results  Component Value Date   CHOL 112 11/27/2016   HDL 45 (L) 11/27/2016   LDLCALC 37 11/27/2016   TRIG 148 11/27/2016   CHOLHDL 2.5 11/27/2016    Hypertension: Losartan had been stopped due to low BP's with remicade infusions a while ago; this was restarted at her last physical (11/2016), when higher blood pressures were noted. Atenolol was changed to metoprolol (due to atenolol not being available). She had been on atenolol for a very long time (she believes started for palpitations/irregular heartbeat, many years ago).  She denies headaches, dizziness, chest pain, palpitations.  She recently started checking BP at [facility]), around 2-4pm. These were checked 4/15 and 5/1, BP 141/93 P 69, 147/82 P74.  [facility] station also monitors weight,  and lost from 209 to 205# on these dates.  Prior to this, she hadn't checked her BP (has a monitor at home, hasn't been using; has been verified as accurate in the past).    Switched how she takes her meds--atorvastatin in morning, with her metformin, losartan and metoprolol at night (she used to take them in the morning).   Diabetes: She doesn't check blood sugars. Her metformin was increased to 1500 mg in September 2017. She is tolerating this dose without side  effects. She continues to take 2 in the morning (though she doesn't start eating until 2pm) and 1 at night with dinner.   Denies any numbness or tingling.  Last diabetic eye exam was  12/2016, no retinopathy She had to r/s her appt last week. Checks feet regularly, no concerns. Denies polydipsia, polyuria Lab Results  Component Value Date   HGBA1C 7.1 07/13/2017   Psoriatic arthritis: Under the care of Dr. Amil Amen. Doing well on her current regimen (Stelara). Denies any pain.  Immunization History  Administered Date(s) Administered  . Influenza Split 09/08/2012, 08/10/2014, 08/21/2015  . Influenza, High Dose Seasonal PF 07/10/2016, 07/13/2017  . PPD Test 09/08/2012  . Pneumococcal Conjugate-13 04/20/2014  . Pneumococcal Polysaccharide-23 04/23/2015  . Tdap 06/19/2011   Last Pap smear: 03/2014 normal, no high risk HPV detected Last mammogram: 10/2017 Last colonoscopy: 07/2014 mild diverticulosis; Dr. Olevia Perches; repeat 10 years Last DEXA: 10/2017 T-1.7 L femoral neck Dentist: goes twice yearly Ophtho: goes yearly; also sees retinal specialist Exercise:  Has recumbent bike, 45-60 minutes 6 days/week since January. Vitamin D-OH screen: 03/2016 normal at 64  Other doctors caring for patient include: Dr. Amil Amen (rheum) Dr. Amy Martinique (derm) Dr. Kathrin Penner (ophtho) Dr. Oval Linsey (retinal specialist/oncologist at [facility] ophtho) Dr. Luther Parody (dentist) Dr. Olevia Perches (retired--GI)  Depression screen: Negative Functional status screen: unremarkable Fall screen: negative. Mini-Cog screen: normal (score of 5)  End of Life Discussion: Patient hasa medical power of attorney (her daughter) and Living Will.   Past Medical History:  Diagnosis Date  .  Abnormal mammogram 9/07   Right breast--bx sclerosing papilloma  . Abnormal mammogram 02/2009   Left; suspicious abnormality L breast mammo and u/s.  Biopsy recommended but patient never did nor f/u mammo  . Abnormal TSH 2015   negative Ab's;  borderline on repeat  . Arthritis    psoriatic - digits  . Diabetes type 2, controlled ([place])    A1c 6.5 07/2014 (had been prediabetic prior)  . Diverticulosis 07/2014   mild, noted on colonoscopy  . DM (diabetes mellitus), gestational    with first pregnancy (stillborn)--based on autopsy; not diagnosed during pregnancy  . GERD (gastroesophageal reflux disease)   . Heart murmur   . History of kidney stones   . HTN (hypertension)   . Kidney stone 1997  . Osteopenia 7/07   mild  . Psoriatic arthritis ([place])    on Stelara (Dr. Amil Amen); previously took Remicade  . Seasonal allergies   . Varicose veins   . Venous insufficiency   . Vitamin D deficiency 12/09    Past Surgical History:  Procedure Laterality Date  . BREAST BIOPSY Right 9/07   right--sclerosing papilloma needle biopsy only  . [place]  . INSERTION OF MESH N/A 06/30/2017   Procedure: INSERTION OF MESH PATCH;  Surgeon: Armandina Gemma, MD;  Location: [facility];  Service: General;  Laterality: N/A;  . TONSILLECTOMY  age 48  . TONSILLECTOMY AND ADENOIDECTOMY    . UMBILICAL HERNIA REPAIR N/A 06/30/2017   Procedure: HERNIA REPAIR UMBILICAL;  Surgeon: Armandina Gemma, MD;  Location: [facility];  Service: General;  Laterality: N/A;    Social History   Socioeconomic History  . Marital status: Married    Spouse name: Not on file  . Number of children: Not on file  . Years of education: Not on file  . Highest education level: Not on file  Occupational History  . 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 Financial resource strain: Not on file  . Food insecurity:    Worry: Not on file    Inability: Not on file  . Transportation needs:    Medical: Not on file    Non-medical: Not on file  Tobacco Use  . Smoking status: Never Smoker  . Smokeless tobacco: Never Used  Substance and Sexual Activity  . Alcohol use: Yes    Alcohol/week: 0.6 oz     Types: 1 Glasses of wine per week    Comment: one drink/month  . Drug use: No  . Sexual activity: Yes    Partners: Male  Lifestyle  . Physical activity:    Days per week: Not on file    Minutes per session: Not on file  . Stress: Not on file  Relationships  . Social connections:    Talks on phone: Not on file    Gets together: Not on file    Attends religious service: Not on file    Active member of club or organization: Not on file    Attends meetings of clubs or organizations: Not on file    Relationship status: Not on file  . Intimate partner violence:    Fear of current or ex partner: Not on file    Emotionally abused: Not on file    Physically abused: Not on file    Forced sexual activity: Not on file  Other Topics Concern  . Not on file  Social History Narrative   Lives with husband. Children live  in [place], [place], [place], and [place], [place]. 4 grandchildren.   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    Family History  Problem Relation Age of Onset  . Stroke Mother   . Hypertension Mother   . Cancer Mother        SKIN CANCER  . GER disease Father   . Heart disease Brother   . Cancer Brother        prostate  . Cancer Maternal Aunt        breast cancer late 51's  . Cancer Maternal Grandmother        breast cancer 60's  . Colon cancer Neg Hx   . Rectal cancer Neg Hx   . Stomach cancer Neg Hx     Outpatient Encounter Medications as of 02/25/2018  Medication Sig Note  . Ascorbic Acid (VITA-C PO) Take by mouth daily.   Marland Kitchen atorvastatin (LIPITOR) 20 MG tablet TAKE 1 TABLET(20 MG) BY MOUTH DAILY   . BIOTIN PO Take by mouth.   . calcium carbonate (TUMS EX) 750 MG chewable tablet Chew 1 tablet by mouth 2 (two) times daily. 02/25/2018: Takes 1 BID  . CHROMIUM PO Take by mouth.   . COPPER PO Take by mouth.   . Cyanocobalamin (VITAMIN B12 PO) Take by mouth.   . FOLIC ACID PO Take by mouth.   . IODINE,  KELP, PO Take by mouth.   Marland Kitchen ipratropium (ATROVENT) 0.03 % nasal spray USE 2 SPRAYS IN EACH NOSTRIL EVERY 12 HOURS   . IRON PO Take by mouth. 02/25/2018: All part of her vitamins she takes  . losartan (COZAAR) 25 MG tablet TAKE 1 TABLET(25 MG) BY MOUTH DAILY   . LUTEIN PO Take by mouth.   Marland Kitchen LYCOPENE PO Take by mouth.   . Magnesium 125 MG CAPS Take by mouth.   Marland Kitchen MANGANESE PO Take by mouth.   . metFORMIN (GLUCOPHAGE-XR) 500 MG 24 hr tablet TAKE 3 TABLETS(1500 MG) BY MOUTH DAILY WITH BREAKFAST   . metoprolol succinate (TOPROL-XL) 25 MG 24 hr tablet TAKE 1 TABLET(25 MG) BY MOUTH DAILY   . NIACIN PO Take by mouth.   . Omega-3 Fatty Acids (OMEGA-3 EPA FISH OIL PO) Take by mouth 2 (two) times daily.   . Pyridoxine HCl (VITAMIN B-6 PO) Take by mouth.   Marland Kitchen RIBOFLAVIN PO Take by mouth.   . SELENIUM PO Take by mouth.   . simethicone (MYLICON) 80 MG chewable tablet Chew 80 mg by mouth as needed for flatulence.   . Thiamine HCl (THIAMINE PO) Take by mouth.   . ustekinumab (STELARA) 45 MG/0.5ML SOSY injection Inject 45 mg into the skin. 02/25/2018: Every 3 months  . VITAMIN A PO Take 2,000 Int'l Units by mouth daily.   Marland Kitchen VITAMIN E PO Take 60 Int'l Units by mouth daily.   Marland Kitchen VITAMIN K PO Take by mouth.   . Zinc Sulfate (ZINC 15 PO) Take by mouth.   . [DISCONTINUED] Calcium-Magnesium-Vitamin D (CALCIUM 500 PO) Take by mouth.   . Cholecalciferol (VITAMIN D3 PO) Take 800 Int'l Units by mouth 2 (two) times daily.   . clobetasol (TEMOVATE) 0.05 % external solution APPLY TO THE SKIN BID 04/09/2016: Received from: External Pharmacy  . EPINEPHrine (EPIPEN 2-PAK) 0.3 mg/0.3 mL IJ SOAJ injection Inject 0.3 mLs (0.3 mg total) into the muscle once. (Patient not taking: Reported on 07/13/2017) 06/23/2017: Carries but has not had to use  it.  . loratadine (CLARITIN) 10 MG tablet Take 10 mg by mouth daily.   . ranitidine (ZANTAC) 75 MG tablet Take 75 mg by mouth 2 (two) times daily. 11/27/2016: Uses prn  . [DISCONTINUED]  HYDROcodone-acetaminophen (NORCO/VICODIN) 5-325 MG tablet Take 1-2 tablets by mouth every 4 (four) hours as needed for moderate pain. (Patient not taking: Reported on 07/13/2017)    No facility-administered encounter medications on file as of 02/25/2018.     Allergies  Allergen Reactions  . Sesame Oil Anaphylaxis    Sesame seed/oil  . Ace Inhibitors Cough  . Latex Rash    ROS: The patient denies anorexia, fever, headaches, vision changes, hearing changes, ear pain, sore throat, breast concerns, chest pain, palpitations, dizziness, syncope, dyspnea on exertion, cough, swelling, nausea, vomiting, diarrhea, constipation, abdominal pain, melena, hematochezia, indigestion/heartburn, hematuria, incontinence, dysuria, vaginal bleeding, discharge, odor or itch, genital lesions, numbness, tingling, weakness, tremor, suspicious skin lesions, depression, anxiety, abnormal bleeding/bruising, or enlarged lymph nodes. Currently denies any joint pains. Weight changes per HPI. Patch of psoriasis on her back for a year--just started getting some sun to help it (20"/day unprotected sun)   PHYSICAL EXAM:  BP (!) 170/90   Pulse 68   Ht 5' 3.5" (1.613 m)   Wt 205 lb (93 kg)   BMI 35.74 kg/m   Wt Readings from Last 3 Encounters:  02/25/18 205 lb (93 kg)  07/13/17 206 lb 6.4 oz (93.6 kg)  06/30/17 207 lb (93.9 kg)    General Appearance:   Alert, cooperative, no distress, appears stated age  Head:   Normocephalic, without obvious abnormality, atraumatic  Eyes:   PERRL, conjunctiva/corneas clear, EOM's intact, fundi benign  Ears:   Normal TM's and external ear canals  Nose:  Nares normal, no purulent drainage or sinus tenderness. Nasal mucosa is normal. No sinus tenderness  Throat:  Lips, mucosa, and tongue normal; teeth and gums normal  Neck:  Supple, no lymphadenopathy; thyroid: noenlargement/ tenderness/nodules; no JVD.  No bruit appreciable (previously noted slight bruit on  right)  Back:  Spine nontender, no curvature, ROM normal, no CVA tenderness  Lungs:   Clear to auscultation bilaterally without wheezes, rales or ronchi; respirations unlabored  Chest Wall:   No tenderness or deformity  Heart:   Regular rate and rhythm, S1 and S2 normal, no rub or gallop, 2/6 SEM  Breast Exam:   No tenderness, masses, or nipple discharge or inversion. No axillary lymphadenopathy  Abdomen:   Soft, non-tender, nondistended, normoactive bowel sounds,   no masses, no hepatosplenomegaly. Obese. WHSS.  Genitalia:   Normal external genitalia without lesions, +atrophic changes. BUS and vagina normal; no cervical motion tenderness. No abnormal vaginal discharge. Uterus and adnexa not enlarged, but exam is somewhat limited by her body habitus; adnexa nontender, no masses. Pap not performed.   Rectal:   Normal tone, no masses or tenderness; guaiac negative stool  Extremities:  No clubbing, cyanosis or edema  Pulses:  2+ and symmetric all extremities  Skin:  Skin turgor normal, no suspicious lesions. Venous stasis hyperpigmentation of lower shins bilaterally. Extensive psoriatic plaques across her entire upper buttock/low back area  Lymph nodes:  Cervical, supraclavicular, and axillary nodes normal  Neurologic:  CNII-XII intact, normal strength, sensation and gait; reflexes 2+ and symmetric throughout  Psych: Normal mood, affect, hygiene and grooming.       Normal diabetic foot exam.    Lab Results  Component Value Date   HGBA1C 6.5 02/25/2018    ASSESSMENT/PLAN:  Medicare annual wellness visit, subsequent  Hypertension associated with diabetes ([place]) - BP high today, lower (but still high) at pharmacy recently. Low Na diet, monitor more frequently (including use of home monitor) and f/u in a month with list - Plan: Comprehensive metabolic panel, losartan (COZAAR) 25 MG tablet  Hyperlipidemia associated  with type 2 diabetes mellitus ([place]) - Plan: Lipid panel  Controlled type 2 diabetes mellitus with complication, without long-term current use of insulin ([facility]) - Plan: HgB A1c, Comprehensive metabolic panel, TSH, Microalbumin / creatinine urine ratio  Medication monitoring encounter - Plan: Comprehensive metabolic panel, Lipid panel, CBC with Differential/Platelet  Psoriatic arthritis ([place]) - doing well on current regimen, no joint pain.  Some skin flare on back which usually responds to sun  Pure hypercholesterolemia - continue statin for primary prevention - Plan: atorvastatin (LIPITOR) 20 MG tablet  Controlled type 2 diabetes mellitus without complication, without long-term current use of insulin ([facility]) - A1c increased from 6.7-7. Increase metformin to '1500mg'$  daily; counseled re: diet, exercise, wt loss - Plan: metFORMIN (GLUCOPHAGE-XR) 500 MG 24 hr tablet  Hypertension associated with diabetes ([place]) - Plan: Comprehensive metabolic panel, losartan (COZAAR) 25 MG tablet  Severe obesity (BMI 35.0-35.9 with comorbidity) () - counseled re: diet/exercise. Discussed concerns of fasting (slower metabolism)   A1c c-met, lipids, TSH, urine microalb, CBC Copies of labs to Dr. Amil Amen  Discussed monthly self breast exams and yearly mammograms. Recommended at least 30 minutes of aerobic activity at least 5 days/week, weight-bearing exercise 2x/week; proper sunscreen use reviewed; healthy diet, including goals of calcium and vitamin D intake and alcohol recommendations (less than or equal to 1 drink/day) reviewed; regular seatbelt use; changing batteries in smoke detectors. Immunization recommendations discussed-- continue yearly high dose flu shots. Shingrix recommended, risks/side effects reviewed, to get from pharmacy.Colonoscopy is UTD. Reminded to schedule diabetic eye exam.   End of Life: Asked for copies of Living Will and healthcare power of attorney. Full Code, full care  Please be  sure that your total calcium intake (food and all vitamins/supplements) do not exceed 1200-'1500mg'$  or you might increase your risk for kidney stones. Try and get as much from your diet, and make up the difference with vitamins--you likely may only need 1 Tums/day.   Medicare Attestation I have personally reviewed: The patient's medical and social history Their use of alcohol, tobacco or illicit drugs Their current medications and supplements The patient's functional ability including ADLs,fall risks, home safety risks, cognitive, and hearing and visual impairment Diet and physical activities Evidence for depression or mood disorders  The patient's weight, height and  BMI have been recorded in the chart.  I have made referrals, counseling, and provided education to the patient based on review of the above and I have provided the patient with a written personalized care plan for preventive services.

## 2018-02-25 ENCOUNTER — Encounter: Payer: Self-pay | Admitting: Family Medicine

## 2018-02-25 ENCOUNTER — Ambulatory Visit (INDEPENDENT_AMBULATORY_CARE_PROVIDER_SITE_OTHER): Payer: Medicare Other | Admitting: Family Medicine

## 2018-02-25 VITALS — BP 170/90 | HR 68 | Ht 63.5 in | Wt 205.0 lb

## 2018-02-25 DIAGNOSIS — I152 Hypertension secondary to endocrine disorders: Secondary | ICD-10-CM

## 2018-02-25 DIAGNOSIS — E7800 Pure hypercholesterolemia, unspecified: Secondary | ICD-10-CM | POA: Diagnosis not present

## 2018-02-25 DIAGNOSIS — Z6835 Body mass index (BMI) 35.0-35.9, adult: Secondary | ICD-10-CM

## 2018-02-25 DIAGNOSIS — E1169 Type 2 diabetes mellitus with other specified complication: Secondary | ICD-10-CM

## 2018-02-25 DIAGNOSIS — Z0000 Encounter for general adult medical examination without abnormal findings: Secondary | ICD-10-CM | POA: Diagnosis not present

## 2018-02-25 DIAGNOSIS — E118 Type 2 diabetes mellitus with unspecified complications: Secondary | ICD-10-CM | POA: Diagnosis not present

## 2018-02-25 DIAGNOSIS — E1159 Type 2 diabetes mellitus with other circulatory complications: Secondary | ICD-10-CM

## 2018-02-25 DIAGNOSIS — E119 Type 2 diabetes mellitus without complications: Secondary | ICD-10-CM | POA: Diagnosis not present

## 2018-02-25 DIAGNOSIS — Z5181 Encounter for therapeutic drug level monitoring: Secondary | ICD-10-CM | POA: Diagnosis not present

## 2018-02-25 DIAGNOSIS — L4050 Arthropathic psoriasis, unspecified: Secondary | ICD-10-CM

## 2018-02-25 DIAGNOSIS — E785 Hyperlipidemia, unspecified: Secondary | ICD-10-CM | POA: Diagnosis not present

## 2018-02-25 DIAGNOSIS — I10 Essential (primary) hypertension: Secondary | ICD-10-CM

## 2018-02-25 LAB — POCT GLYCOSYLATED HEMOGLOBIN (HGB A1C): HEMOGLOBIN A1C: 6.5

## 2018-02-25 MED ORDER — METFORMIN HCL ER 500 MG PO TB24: ORAL_TABLET | ORAL | 1 refills | Status: DC

## 2018-02-25 MED ORDER — LOSARTAN POTASSIUM 25 MG PO TABS: ORAL_TABLET | ORAL | 1 refills | Status: DC

## 2018-02-25 MED ORDER — ATORVASTATIN CALCIUM 20 MG PO TABS: ORAL_TABLET | ORAL | 3 refills | Status: DC

## 2018-02-25 NOTE — Patient Instructions (Addendum)
HEALTH MAINTENANCE RECOMMENDATIONS:  It is recommended that you get at least 30 minutes of aerobic exercise at least 5 days/week (for weight loss, you may need as much as 60-90 minutes). This can be any activity that gets your heart rate up. This can be divided in 10-15 minute intervals if needed, but try and build up your endurance at least once a week.  Weight bearing exercise is also recommended twice weekly.  Eat a healthy diet with lots of vegetables, fruits and fiber.  "Colorful" foods have a lot of vitamins (ie green vegetables, tomatoes, red peppers, etc).  Limit sweet tea, regular sodas and alcoholic beverages, all of which has a lot of calories and sugar.  Up to 1 alcoholic drink daily may be beneficial for women (unless trying to lose weight, watch sugars).  Drink a lot of water.  Calcium recommendations are 1200-1500 mg daily (1500 mg for postmenopausal women or women without ovaries), and vitamin D 1000 IU daily.  This should be obtained from diet and/or supplements (vitamins), and calcium should not be taken all at once, but in divided doses.  Monthly self breast exams and yearly mammograms for women over the age of 49 is recommended.  Sunscreen of at least SPF 30 should be used on all sun-exposed parts of the skin when outside between the hours of 10 am and 4 pm (not just when at beach or pool, but even with exercise, golf, tennis, and yard work!)  Use a sunscreen that says "broad spectrum" so it covers both UVA and UVB rays, and make sure to reapply every 1-2 hours.  Remember to change the batteries in your smoke detectors when changing your clock times in the spring and fall.  Use your seat belt every time you are in a car, and please drive safely and not be distracted with cell phones and texting while driving.   April Matthews , Thank you for taking time to come for your Medicare Wellness Visit. I appreciate your ongoing commitment to your health goals. Please review the following  plan we discussed and let me know if I can assist you in the future.   These are the goals we discussed: Goals    None      This is a list of the screening recommended for you and due dates:  Health Maintenance  Topic Date Due  . Complete foot exam   11/27/2017  . Hemoglobin A1C  01/10/2018  . Eye exam for diabetics  01/15/2018  . Flu Shot  05/27/2018  . Mammogram  11/19/2019  . Tetanus Vaccine  06/18/2021  . Colon Cancer Screening  08/25/2024  . DEXA scan (bone density measurement)  Completed  .  Hepatitis C: One time screening is recommended by Center for Disease Control  (CDC) for  adults born from [birth year] through 1965.   Completed  . Pneumonia vaccines  Completed   Foot exam and A1c were done today. Reschedule your eye exam and have them send [place] a copy stating whether or not there is retinopathy. Continue yearly mammograms (due 2020, not 2021 as stated above). Next bone density test won't be needed before 10/2019 (not sure if Dr. Amil Amen will want 2 vs 3 years).  I recommend getting the new shingles vaccine (Shingrix). You will need to check with your insurance to see if it is covered, and if covered by Medicare Part D, you need to get from the pharmacy rather than our office.  It is a series of 2  injections, spaced 2 months apart.  Check your blood pressure regularly, both at the pharmacy and on your home monitor.  Bring your list to your visit.  If your blood pressure remains >135-140/85-90 we need to make adjustments to your medication. Continue daily exercise, weight loss and low sodium diet.   Low-Sodium Eating Plan Sodium, which is an element that makes up salt, helps you maintain a healthy balance of fluids in your body. Too much sodium can increase your blood pressure and cause fluid and waste to be held in your body. Your health care provider or dietitian may recommend following this plan if you have high blood pressure (hypertension), kidney disease, liver disease, or heart  failure. Eating less sodium can help lower your blood pressure, reduce swelling, and protect your heart, liver, and kidneys. What are tips for following this plan? General guidelines  Most people on this plan should limit their sodium intake to 1,500-2,000 mg (milligrams) of sodium each day. Reading food labels  The Nutrition Facts label lists the amount of sodium in one serving of the food. If you eat more than one serving, you must multiply the listed amount of sodium by the number of servings.  Choose foods with less than 140 mg of sodium per serving.  Avoid foods with 300 mg of sodium or more per serving. Shopping  Look for lower-sodium products, often labeled as "low-sodium" or "no salt added."  Always check the sodium content even if foods are labeled as "unsalted" or "no salt added".  Buy fresh foods. ? Avoid canned foods and premade or frozen meals. ? Avoid canned, cured, or processed meats  Buy breads that have less than 80 mg of sodium per slice. Cooking  Eat more home-cooked food and less restaurant, buffet, and fast food.  Avoid adding salt when cooking. Use salt-free seasonings or herbs instead of table salt or sea salt. Check with your health care provider or pharmacist before using salt substitutes.  Cook with plant-based oils, such as canola, sunflower, or olive oil. Meal planning  When eating at a restaurant, ask that your food be prepared with less salt or no salt, if possible.  Avoid foods that contain MSG (monosodium glutamate). MSG is sometimes added to [place] food, bouillon, and some canned foods. What foods are recommended? The items listed may not be a complete list. Talk with your dietitian about what dietary choices are best for you. Grains Low-sodium cereals, including oats, puffed wheat and rice, and shredded wheat. Low-sodium crackers. Unsalted rice. Unsalted pasta. Low-sodium bread. Whole-grain breads and whole-grain pasta. Vegetables Fresh or  frozen vegetables. "No salt added" canned vegetables. "No salt added" tomato sauce and paste. Low-sodium or reduced-sodium tomato and vegetable juice. Fruits Fresh, frozen, or canned fruit. Fruit juice. Meats and other protein foods Fresh or frozen (no salt added) meat, poultry, seafood, and fish. Low-sodium canned tuna and salmon. Unsalted nuts. Dried peas, beans, and lentils without added salt. Unsalted canned beans. Eggs. Unsalted nut butters. Dairy Milk. Soy milk. Cheese that is naturally low in sodium, such as ricotta cheese, fresh mozzarella, or Swiss cheese Low-sodium or reduced-sodium cheese. Cream cheese. Yogurt. Fats and oils Unsalted butter. Unsalted margarine with no trans fat. Vegetable oils such as canola or olive oils. Seasonings and other foods Fresh and dried herbs and spices. Salt-free seasonings. Low-sodium mustard and ketchup. Sodium-free salad dressing. Sodium-free light mayonnaise. Fresh or refrigerated horseradish. Lemon juice. Vinegar. Homemade, reduced-sodium, or low-sodium soups. Unsalted popcorn and pretzels. Low-salt or salt-free chips.  What foods are not recommended? The items listed may not be a complete list. Talk with your dietitian about what dietary choices are best for you. Grains Instant hot cereals. Bread stuffing, pancake, and biscuit mixes. Croutons. Seasoned rice or pasta mixes. Noodle soup cups. Boxed or frozen macaroni and cheese. Regular salted crackers. Self-rising flour. Vegetables Sauerkraut, pickled vegetables, and relishes. Olives. [place] fries. Onion rings. Regular canned vegetables (not low-sodium or reduced-sodium). Regular canned tomato sauce and paste (not low-sodium or reduced-sodium). Regular tomato and vegetable juice (not low-sodium or reduced-sodium). Frozen vegetables in sauces. Meats and other protein foods Meat or fish that is salted, canned, smoked, spiced, or pickled. Bacon, ham, sausage, hotdogs, corned beef, chipped beef, packaged  lunch meats, salt pork, jerky, pickled herring, anchovies, regular canned tuna, sardines, salted nuts. Dairy Processed cheese and cheese spreads. Cheese curds. Blue cheese. Feta cheese. String cheese. Regular cottage cheese. Buttermilk. Canned milk. Fats and oils Salted butter. Regular margarine. Ghee. Bacon fat. Seasonings and other foods Onion salt, garlic salt, seasoned salt, table salt, and sea salt. Canned and packaged gravies. Worcestershire sauce. Tartar sauce. Barbecue sauce. Teriyaki sauce. Soy sauce, including reduced-sodium. Steak sauce. Fish sauce. Oyster sauce. Cocktail sauce. Horseradish that you find on the shelf. Regular ketchup and mustard. Meat flavorings and tenderizers. Bouillon cubes. Hot sauce and Tabasco sauce. Premade or packaged marinades. Premade or packaged taco seasonings. Relishes. Regular salad dressings. Salsa. Potato and tortilla chips. Corn chips and puffs. Salted popcorn and pretzels. Canned or dried soups. Pizza. Frozen entrees and pot pies. Summary  Eating less sodium can help lower your blood pressure, reduce swelling, and protect your heart, liver, and kidneys.  Most people on this plan should limit their sodium intake to 1,500-2,000 mg (milligrams) of sodium each day.  Canned, boxed, and frozen foods are high in sodium. Restaurant foods, fast foods, and pizza are also very high in sodium. You also get sodium by adding salt to food.  Try to cook at home, eat more fresh fruits and vegetables, and eat less fast food, canned, processed, or prepared foods. This information is not intended to replace advice given to you by your health care provider. Make sure you discuss any questions you have with your health care provider. Document Released: 04/04/2002 Document Revised: 10/06/2016 Document Reviewed: 10/06/2016 Elsevier Interactive Patient Education  2018 [place] from [place] general, healthful diet is based on the 2010 Dietary Guidelines  for Americans. The amount of food you need to eat from each food group depends on your age, sex, and level of physical activity and can be individualized by a dietitian. Go to [email or web address] for more information. What do I need to know about the MyPlate plan?  Enjoy your food, but eat less.  Avoid oversized portions. ?  of your plate should include fruits and vegetables. ?  of your plate should be grains. ?  of your plate should be protein. Grains  Make at least half of your grains whole grains.  For a 2,000 calorie daily food plan, eat 6 oz every day.  1 oz is about 1 slice bread, 1 cup cereal, or  cup cooked rice, cereal, or pasta. Vegetables  Make half your plate fruits and vegetables.  For a 2,000 calorie daily food plan, eat 2 cups every day.  1 cup is about 1 cup raw or cooked vegetables or vegetable juice or 2 cups raw leafy greens. Fruits  Make half your plate fruits and vegetables.  For  a 2,000 calorie daily food plan, eat 2 cups every day.  1 cup is about 1 cup fruit or 100% fruit juice or  cup dried fruit. Protein  For a 2,000 calorie daily food plan, eat 5 oz every day.  1 oz is about 1 oz meat, poultry, or fish,  cup cooked beans, 1 egg, 1 Tbsp peanut butter, or  oz nuts or seeds. Dairy  Switch to fat-free or low-fat (1%) milk.  For a 2,000 calorie daily food plan, eat 3 cups every day.  1 cup is about 1 cup milk or yogurt or soy milk (soy beverage), 1 oz natural cheese, or 2 oz processed cheese. Fats, Oils, and Empty Calories  Only small amounts of oils are recommended.  Empty calories are calories from solid fats or added sugars.  Compare sodium in foods like soup, bread, and frozen meals. Choose the foods with lower numbers.  Drink water instead of sugary drinks. What foods can I eat? Grains Whole grains such as whole wheat, quinoa, millet, and bulgur. Bread, rolls, and pasta made from whole grains. Brown or wild rice. Hot or cold  cereals made from whole grains and without added sugar. Vegetables All fresh vegetables, especially fresh red, dark green, or orange vegetables. Peas and beans. Low-sodium frozen or canned vegetables prepared without added salt. Low-sodium vegetable juices. Fruits All fresh, frozen, and dried fruits. Canned fruit packed in water or fruit juice without added sugar. Fruit juices without added sugar. Meats and Other Protein Sources Boiled, baked, or grilled lean meat trimmed of fat. Skinless poultry. Fresh seafood and shellfish. Canned seafood packed in water. Unsalted nuts and unsalted nut butters. Tofu. Dried beans and pea. Eggs. Dairy Low-fat or fat-free milk, yogurt, and cheeses. Sweets and Desserts Frozen desserts made from low-fat milk. Fats and Oils Olive, peanut, and canola oils and margarine. Salad dressing and mayonnaise made from these oils. Other Soups and casseroles made from allowed ingredients and without added fat or salt. The items listed above may not be a complete list of recommended foods or beverages. Contact your dietitian for more options. What foods are not recommended? Grains Sweetened, low-fiber cereals. Packaged baked goods. Snack crackers and chips. Cheese crackers, butter crackers, and biscuits. Frozen waffles, sweet breads, doughnuts, pastries, packaged baking mixes, pancakes, cakes, and cookies. Vegetables Regular canned or frozen vegetables or vegetables prepared with salt. Canned tomatoes. Canned tomato sauce. Fried vegetables. Vegetables in cream sauce or cheese sauce. Fruits Fruits packed in syrup or made with added sugar. Meats and Other Protein Sources Marbled or fatty meats such as ribs. Poultry with skin. Fried meats, poultry, eggs, or fish. Sausages, hot dogs, and deli meats such as pastrami, bologna, or salami. Dairy Whole milk, cream, cheeses made from whole milk, sour cream. Ice cream or yogurt made from whole milk or with added sugar. Beverages For  adults, no more than one alcoholic drink per day. Regular soft drinks or other sugary beverages. Juice drinks. Sweets and Desserts Sugary or fatty desserts, candy, and other sweets. Fats and Oils Solid shortening or partially hydrogenated oils. Solid margarine. Margarine that contains trans fats. Butter. The items listed above may not be a complete list of foods and beverages to avoid. Contact your dietitian for more information. This information is not intended to replace advice given to you by your health care provider. Make sure you discuss any questions you have with your health care provider. Document Released: 11/02/2007 Document Revised: 03/20/2016 Document Reviewed: 09/21/2013 Elsevier Interactive Patient Education  2018 Elsevier Inc.  

## 2018-02-26 LAB — CBC WITH DIFFERENTIAL/PLATELET
BASOS ABS: 0.1 10*3/uL (ref 0.0–0.2)
Basos: 1 %
EOS (ABSOLUTE): 0.3 10*3/uL (ref 0.0–0.4)
EOS: 3 %
HEMOGLOBIN: 13.4 g/dL (ref 11.1–15.9)
Hematocrit: 40.6 % (ref 34.0–46.6)
Immature Grans (Abs): 0 10*3/uL (ref 0.0–0.1)
Immature Granulocytes: 0 %
LYMPHS ABS: 2.3 10*3/uL (ref 0.7–3.1)
Lymphs: 26 %
MCH: 32 pg (ref 26.6–33.0)
MCHC: 33 g/dL (ref 31.5–35.7)
MCV: 97 fL (ref 79–97)
MONOCYTES: 6 %
MONOS ABS: 0.5 10*3/uL (ref 0.1–0.9)
NEUTROS ABS: 5.7 10*3/uL (ref 1.4–7.0)
Neutrophils: 64 %
PLATELETS: 341 10*3/uL (ref 150–379)
RBC: 4.19 x10E6/uL (ref 3.77–5.28)
RDW: 14 % (ref 12.3–15.4)
WBC: 8.9 10*3/uL (ref 3.4–10.8)

## 2018-02-26 LAB — COMPREHENSIVE METABOLIC PANEL
ALBUMIN: 4.5 g/dL (ref 3.5–4.8)
ALT: 23 IU/L (ref 0–32)
AST: 18 IU/L (ref 0–40)
Albumin/Globulin Ratio: 1.7 (ref 1.2–2.2)
Alkaline Phosphatase: 66 IU/L (ref 39–117)
BILIRUBIN TOTAL: 0.3 mg/dL (ref 0.0–1.2)
BUN / CREAT RATIO: 19 (ref 12–28)
BUN: 20 mg/dL (ref 8–27)
CALCIUM: 9.9 mg/dL (ref 8.7–10.3)
CHLORIDE: 104 mmol/L (ref 96–106)
CO2: 23 mmol/L (ref 20–29)
CREATININE: 1.05 mg/dL — AB (ref 0.57–1.00)
GFR calc non Af Amer: 53 mL/min/{1.73_m2} — ABNORMAL LOW (ref >59)
GFR, EST AFRICAN AMERICAN: 61 mL/min/{1.73_m2} (ref >59)
GLUCOSE: 130 mg/dL — AB (ref 65–99)
Globulin, Total: 2.7 g/dL (ref 1.5–4.5)
Potassium: 4.5 mmol/L (ref 3.5–5.2)
Sodium: 143 mmol/L (ref 134–144)
TOTAL PROTEIN: 7.2 g/dL (ref 6.0–8.5)

## 2018-02-26 LAB — MICROALBUMIN / CREATININE URINE RATIO
CREATININE, UR: 190.3 mg/dL
MICROALB/CREAT RATIO: 6.3 mg/g{creat} (ref 0.0–30.0)
MICROALBUM., U, RANDOM: 12 ug/mL

## 2018-02-26 LAB — LIPID PANEL
Chol/HDL Ratio: 2.5 ratio (ref 0.0–4.4)
Cholesterol, Total: 124 mg/dL (ref 100–199)
HDL: 50 mg/dL (ref >39)
LDL CALC: 39 mg/dL (ref 0–99)
Triglycerides: 174 mg/dL — ABNORMAL HIGH (ref 0–149)
VLDL Cholesterol Cal: 35 mg/dL (ref 5–40)

## 2018-02-26 LAB — TSH: TSH: 4.78 u[IU]/mL — AB (ref 0.450–4.500)

## 2018-03-08 DIAGNOSIS — E119 Type 2 diabetes mellitus without complications: Secondary | ICD-10-CM | POA: Diagnosis not present

## 2018-03-08 DIAGNOSIS — H524 Presbyopia: Secondary | ICD-10-CM | POA: Diagnosis not present

## 2018-03-08 DIAGNOSIS — D3132 Benign neoplasm of left choroid: Secondary | ICD-10-CM | POA: Diagnosis not present

## 2018-03-08 DIAGNOSIS — H25813 Combined forms of age-related cataract, bilateral: Secondary | ICD-10-CM | POA: Diagnosis not present

## 2018-03-08 LAB — HM DIABETES EYE EXAM

## 2018-03-11 ENCOUNTER — Encounter: Payer: Self-pay | Admitting: *Deleted

## 2018-03-24 DIAGNOSIS — M79605 Pain in left leg: Secondary | ICD-10-CM | POA: Diagnosis not present

## 2018-03-24 DIAGNOSIS — E559 Vitamin D deficiency, unspecified: Secondary | ICD-10-CM | POA: Diagnosis not present

## 2018-03-24 DIAGNOSIS — L4050 Arthropathic psoriasis, unspecified: Secondary | ICD-10-CM | POA: Diagnosis not present

## 2018-03-24 DIAGNOSIS — Z6835 Body mass index (BMI) 35.0-35.9, adult: Secondary | ICD-10-CM | POA: Diagnosis not present

## 2018-03-24 DIAGNOSIS — Z79899 Other long term (current) drug therapy: Secondary | ICD-10-CM | POA: Diagnosis not present

## 2018-03-24 DIAGNOSIS — E669 Obesity, unspecified: Secondary | ICD-10-CM | POA: Diagnosis not present

## 2018-03-24 DIAGNOSIS — R7989 Other specified abnormal findings of blood chemistry: Secondary | ICD-10-CM | POA: Diagnosis not present

## 2018-03-24 DIAGNOSIS — M150 Primary generalized (osteo)arthritis: Secondary | ICD-10-CM | POA: Diagnosis not present

## 2018-03-24 DIAGNOSIS — L401 Generalized pustular psoriasis: Secondary | ICD-10-CM | POA: Diagnosis not present

## 2018-04-06 NOTE — Progress Notes (Signed)
Chief Complaint  Patient presents with  . Hypertension    patient is here for blood pressure check and brougt her logs     Patient presents for follow-up on hypertension.  BP's had been elevated at pharmacy and at her physical last month, hadn't been checkin gat home (monitor previously noted to be accurate).  She continues to follow Diabetes Code diet (limited eating window). Stuck at 197# for the past week.  She increased her fasting window to 18-20 hours (from 16-18).  She drinks black coffee in the morning, and water throughout the day. She denies getting headaches, feeling weak. She has more energy, lasts throughout the day.  Doesn't feel hungry. Can be around others eating and not mind not eating.  Her portions are smaller, but can eat whatever she wants, "it is not a diet". She has decreased in abdominal obesity, more defined waist, lost one pant size, and bras are looser.  Started the program 3/15 at 215#, now 197# at home.  She is limiting her sodium intake. She started riding her bike again (30 minutes (6 miles) every day, gradually increasing by 5 minutes); had been doing 60 minutes prior to needing to take a break; hasn't gotten in the pool yet (too cold).  She is compliant with taking losartan 25mg  and toprol XL 25mg  daily. BP's have been running 120-138/70-82, pulse 63-78 BP's at [employer] run higher 140's/90's. Her monitor was verified as accurate 12/2016.  PMH, PSH, SH reviewed  Outpatient Encounter Medications as of 04/07/2018  Medication Sig Note  . Ascorbic Acid (VITA-C PO) Take by mouth daily.   Marland Kitchen atorvastatin (LIPITOR) 20 MG tablet TAKE 1 TABLET(20 MG) BY MOUTH DAILY   . BIOTIN PO Take by mouth.   . calcium carbonate (TUMS EX) 750 MG chewable tablet Chew 1 tablet by mouth 2 (two) times daily. 02/25/2018: Takes 1 BID  . Cholecalciferol (VITAMIN D3 PO) Take 800 Int'l Units by mouth 2 (two) times daily.   . CHROMIUM PO Take by mouth.   . clobetasol (TEMOVATE) 0.05 %  external solution APPLY TO THE SKIN BID 04/09/2016: Received from: External Pharmacy  . COPPER PO Take by mouth.   . Cyanocobalamin (VITAMIN B12 PO) Take by mouth.   . EPINEPHrine (EPIPEN 2-PAK) 0.3 mg/0.3 mL IJ SOAJ injection Inject 0.3 mLs (0.3 mg total) into the muscle once. 06/23/2017: Carries but has not had to use it.  Marland Kitchen FOLIC ACID PO Take by mouth.   . IODINE, KELP, PO Take by mouth.   . IRON PO Take by mouth. 02/25/2018: All part of her vitamins she takes  . loratadine (CLARITIN) 10 MG tablet Take 10 mg by mouth daily.   Marland Kitchen losartan (COZAAR) 25 MG tablet TAKE 1 TABLET(25 MG) BY MOUTH DAILY   . LUTEIN PO Take by mouth.   Marland Kitchen LYCOPENE PO Take by mouth.   . Magnesium 125 MG CAPS Take by mouth.   Marland Kitchen MANGANESE PO Take by mouth.   . metFORMIN (GLUCOPHAGE-XR) 500 MG 24 hr tablet TAKE 3 TABLETS(1500 MG) BY MOUTH DAILY WITH BREAKFAST   . metoprolol succinate (TOPROL-XL) 25 MG 24 hr tablet TAKE 1 TABLET(25 MG) BY MOUTH DAILY   . NIACIN PO Take by mouth.   . Omega-3 Fatty Acids (OMEGA-3 EPA FISH OIL PO) Take by mouth 2 (two) times daily.   . Pyridoxine HCl (VITAMIN B-6 PO) Take by mouth.   . ranitidine (ZANTAC) 75 MG tablet Take 75 mg by mouth 2 (two) times daily.  11/27/2016: Uses prn  . RIBOFLAVIN PO Take by mouth.   . SELENIUM PO Take by mouth.   . simethicone (MYLICON) 80 MG chewable tablet Chew 80 mg by mouth as needed for flatulence.   . Thiamine HCl (THIAMINE PO) Take by mouth.   . ustekinumab (STELARA) 45 MG/0.5ML SOSY injection Inject 45 mg into the skin. 02/25/2018: Every 3 months  . VITAMIN A PO Take 2,000 Int'l Units by mouth daily.   Marland Kitchen VITAMIN E PO Take 60 Int'l Units by mouth daily.   Marland Kitchen VITAMIN K PO Take by mouth.   . Zinc Sulfate (ZINC 15 PO) Take by mouth.   Marland Kitchen ipratropium (ATROVENT) 0.03 % nasal spray USE 2 SPRAYS IN EACH NOSTRIL EVERY 12 HOURS    No facility-administered encounter medications on file as of 04/07/2018.    Allergies  Allergen Reactions  . Sesame Oil Anaphylaxis     Sesame seed/oil  . Ace Inhibitors Cough  . Latex Rash   ROS: no fever, chills, URI symptoms, headaches, dizziness, chest pain, cough, shortness of breath, GI or GU complaints, bleeding, bruising. No significant joint pain or rashes currently. No edema. Moods are good.  PHYSICAL EXAM:  BP 136/90   Pulse 68   Resp 16   Ht 5\' 4"  (1.626 m)   Wt 200 lb 3.2 oz (90.8 kg)   SpO2 97%   BMI 34.36 kg/m    Wt Readings from Last 3 Encounters:  04/07/18 200 lb 3.2 oz (90.8 kg)  02/25/18 205 lb (93 kg)  07/13/17 206 lb 6.4 oz (93.6 kg)   146/90 on RA by MD Unable to hear BP clearly on the left (was higher, but disappeared?)  Well appearing, pleasant female, in good spirits HEENT: conjunctiva and sclera are clear, EOMI, OP clear Neck: No lymphadenopathy, thyromegaly or mass Heart: regular rate and rhythm Lungs: clear bilaterally Extremities: no edema Skin: normal turgor, no visible rash Neuro: alert and oriented, cranial nerves intact, normal gait Psych: normal mood, affect, hygiene and grooming  ASSESSMENT/PLAN:  Hypertension associated with diabetes ([place]) - borderline today, better at home; continue current meds, along with exercise, weight loss, low sodium diet.   Blood pressure is borderline (mostly running 664 systolic, ideally is <403). Given that you are continuing to lose weight, and you are ramping up your exercise, let's continue your current medications and continue to monitor. If your blood pressure remains >135 consistently, I have a low threshold to increase the losartan to 50mg .  For now--I'd prefer to verify the accuracy of the home machine. It is different than the [employer] and here (higher than at home);  Bring your monitor and your list of blood pressures to a nurse visit in 1 month. We can see if your monitor is still accurate, and whether or not to trust the home values you bring. If monitor is accurate and BP's still borderline at 135, we can continue to  monitor until November. If monitor is not accurate and BP is high here (and at [employer]), then we will increase the losartan dose).  Continue on low sodium diet, exercise, weight loss, and monitoring at home and pharmacy, and recording the values.

## 2018-04-07 ENCOUNTER — Encounter: Payer: Self-pay | Admitting: Family Medicine

## 2018-04-07 ENCOUNTER — Ambulatory Visit (INDEPENDENT_AMBULATORY_CARE_PROVIDER_SITE_OTHER): Payer: Medicare Other | Admitting: Family Medicine

## 2018-04-07 VITALS — BP 136/90 | HR 68 | Resp 16 | Ht 64.0 in | Wt 200.2 lb

## 2018-04-07 DIAGNOSIS — I10 Essential (primary) hypertension: Secondary | ICD-10-CM | POA: Diagnosis not present

## 2018-04-07 DIAGNOSIS — E1159 Type 2 diabetes mellitus with other circulatory complications: Secondary | ICD-10-CM | POA: Diagnosis not present

## 2018-04-07 NOTE — Patient Instructions (Signed)
   Blood pressure is borderline (mostly running 440 systolic, ideally is <102). Given that you are continuing to lose weight, and you are ramping up your exercise, let's continue your current medications and continue to monitor. If your blood pressure remains >135 consistently, I have a low threshold to increase the losartan to 50mg .  For now--I'd prefer to verify the accuracy of the home machine. It is different than the [employer] and here (higher than at home);  Bring your monitor and your list of blood pressures to a nurse visit in 1 month. We can see if your monitor is still accurate, and whether or not to trust the home values you bring. If monitor is accurate and BP's still borderline at 135, we can continue to monitor until November. If monitor is not accurate and BP is high here (and at [employer]), then we will increase the losartan dose).  Continue on low sodium diet, exercise, weight loss, and monitoring at home and pharmacy, and recording the values.

## 2018-04-10 ENCOUNTER — Other Ambulatory Visit: Payer: Self-pay | Admitting: Family Medicine

## 2018-04-10 DIAGNOSIS — I10 Essential (primary) hypertension: Secondary | ICD-10-CM

## 2018-05-03 DIAGNOSIS — L4050 Arthropathic psoriasis, unspecified: Secondary | ICD-10-CM | POA: Diagnosis not present

## 2018-05-04 ENCOUNTER — Telehealth: Payer: Self-pay

## 2018-05-04 ENCOUNTER — Other Ambulatory Visit: Payer: Medicare Other

## 2018-05-04 NOTE — Telephone Encounter (Signed)
Pt came in for b/p checks and her readings are as followed. 1 st manuel was 152/84 , 1 st electronic was 150/93. Pt pulse was 63 and O2 was 97. I waited for about 5 min and rechecked manuel was 140/82 and pt machine was 145/85. Pt has advised that she is taking b/p med as advised by you. Please advise and thank you . [name]

## 2018-05-04 NOTE — Telephone Encounter (Signed)
Did she bring in a list of BP's with her? It appears that her monitor is accurate, but the BP's from today are above goal.  If BP's at home are also consistently >151 systolic, then I would increase her losartan from 25mg  to 50mg .  If BP's at home are all <135 (ideally <130/80), then okay to hold at current doses of her medications, and send [place] a list of her home blood pressures in a month for my review.

## 2018-05-05 ENCOUNTER — Other Ambulatory Visit: Payer: Self-pay | Admitting: *Deleted

## 2018-05-05 ENCOUNTER — Other Ambulatory Visit: Payer: Medicare Other

## 2018-05-05 NOTE — Telephone Encounter (Signed)
Spoke with patient and her systolic readings are consistently over 135, she is going to take 2 of her 25mg  losartan and keep track of her bp's for one month and drop off readings to you.She said she has enough meds to last her.

## 2018-06-21 DIAGNOSIS — L817 Pigmented purpuric dermatosis: Secondary | ICD-10-CM | POA: Diagnosis not present

## 2018-06-21 DIAGNOSIS — I788 Other diseases of capillaries: Secondary | ICD-10-CM | POA: Diagnosis not present

## 2018-06-21 DIAGNOSIS — D225 Melanocytic nevi of trunk: Secondary | ICD-10-CM | POA: Diagnosis not present

## 2018-06-21 DIAGNOSIS — L400 Psoriasis vulgaris: Secondary | ICD-10-CM | POA: Diagnosis not present

## 2018-06-21 DIAGNOSIS — D1801 Hemangioma of skin and subcutaneous tissue: Secondary | ICD-10-CM | POA: Diagnosis not present

## 2018-07-13 ENCOUNTER — Other Ambulatory Visit: Payer: Self-pay | Admitting: Family Medicine

## 2018-07-13 DIAGNOSIS — I10 Essential (primary) hypertension: Principal | ICD-10-CM

## 2018-07-13 DIAGNOSIS — E1159 Type 2 diabetes mellitus with other circulatory complications: Secondary | ICD-10-CM

## 2018-07-20 DIAGNOSIS — Z23 Encounter for immunization: Secondary | ICD-10-CM | POA: Diagnosis not present

## 2018-07-27 DIAGNOSIS — L4050 Arthropathic psoriasis, unspecified: Secondary | ICD-10-CM | POA: Diagnosis not present

## 2018-08-18 DIAGNOSIS — D3132 Benign neoplasm of left choroid: Secondary | ICD-10-CM | POA: Diagnosis not present

## 2018-08-18 DIAGNOSIS — H43811 Vitreous degeneration, right eye: Secondary | ICD-10-CM | POA: Diagnosis not present

## 2018-08-30 ENCOUNTER — Other Ambulatory Visit: Payer: Self-pay | Admitting: Family Medicine

## 2018-08-30 ENCOUNTER — Encounter: Payer: Medicare Other | Admitting: Family Medicine

## 2018-08-30 DIAGNOSIS — E1159 Type 2 diabetes mellitus with other circulatory complications: Secondary | ICD-10-CM

## 2018-08-30 DIAGNOSIS — I152 Hypertension secondary to endocrine disorders: Secondary | ICD-10-CM

## 2018-08-30 DIAGNOSIS — I10 Essential (primary) hypertension: Principal | ICD-10-CM

## 2018-09-08 ENCOUNTER — Encounter: Payer: Medicare Other | Admitting: Family Medicine

## 2018-09-14 NOTE — Progress Notes (Signed)
Chief Complaint  Patient presents with  . Diabetes    nonfasting med check. Does not remember having any labs done by Dr.Beekman. No concerns.    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 Hasn't been on the exercise bike, just walks some at school.  Diabetes: She doesn't check blood sugars. Per chart, since September2017 she was taking '1500mg'$  of metformin daily. She toleraetd this dosewithout side effects in the past.  Somehow since her last visit, she changed to taking just 2 daily, and taking them at 10pm.  She takes it with her other medications, since she is the most consistent with taking it when taking everything at once. At her last visit it was reported she was taking 3/day, 2 in the morning (though she doesn't start eating until 2pm) and 1 at night with dinner. Denies any numbness or tingling. Last diabetic eye exam was 02/2018, no retinopathy. Checks feet regularly, no concerns. Denies polydipsia, polyuria Lab Results  Component Value Date   HGBA1C 6.5 02/25/2018   Hypertension: She is compliant with taking losartan '50mg'$  and toprol XL '25mg'$  daily.  Her losartan dose was increased in July after bringing in BP list showing consistently >161 systolic. She was supposed to drop off another BP list at the higher dose prior needing refill, but never did.  She continues to take two '25mg'$  tablet, and rx is only lasting 6 weeks. She didn't bring her list today and really hasn't been checking her blood pressure at home or at the [facility]. Her monitor was verified as accurate 12/2016. She denies headaches, dizziness, chest pain, palpitations, shortness of breath. She usually wears compression socks, and hasn't had much swelling.  Obesity:  She continues to follow Diabetes Code diet (limited eating window, intermittent fasting). She currently has a 16 hour eating window. She drinks black coffee in the morning, and water throughout the day. She denies getting  headaches, feeling weak. She has more energy, lasts throughout the day.  Doesn't feel hungry, feels like her stomach shrunk, eats much smaller portions. Can be around others eating and not mind not eating.  Her portions are smaller, but can eat whatever she wants. She is down 2 pants sizes Weight at home is 185#, down 30# from the start of the program. She is limiting her sodium intake.  Since being back at school, volunteering on the weekends, she hasn't had time to ride her exercise bike.  Hyperlipidemia: Tolerating atorvastatin without side effects. Trying to follow lowfat, low cholesterol diet. She has taken fish oil in the past, not currently Lab Results  Component Value Date   CHOL 124 02/25/2018   HDL 50 02/25/2018   LDLCALC 39 02/25/2018   TRIG 174 (H) 02/25/2018   CHOLHDL 2.5 02/25/2018   She has had some borderline thyroid tests in the past. She denies changes in hair/skin/bowels or energy. +intentional weight loss. Nails no longer have ridges, are better than they had been. Lab Results  Component Value Date   TSH 4.780 (H) 02/25/2018   Psoriatic arthritis:Under the care ofDr. Amil Amen.Doing well on her current regimen (Stelara).Denies any pain. Has small patches of psoriasis on her back--they are shrinking, not getting new ones.  PMH, PSH, SH reviewed and updated  Outpatient Encounter Medications as of 09/15/2018  Medication Sig Note  . atorvastatin (LIPITOR) 20 MG tablet TAKE 1 TABLET(20 MG) BY MOUTH DAILY   . Cyanocobalamin (VITAMIN B12 PO) Take by mouth.   Marland Kitchen ipratropium (ATROVENT)  0.03 % nasal spray USE 2 SPRAYS IN EACH NOSTRIL EVERY 12 HOURS   . losartan (COZAAR) 25 MG tablet TAKE 1 TABLET(25 MG) BY MOUTH DAILY 09/15/2018: Taking 2 daily since July  . metFORMIN (GLUCOPHAGE-XR) 500 MG 24 hr tablet TAKE 3 TABLETS(1500 MG) BY MOUTH DAILY WITH BREAKFAST 09/15/2018: Only taking 2/day (in the evening when she takes all her other meds).  . metoprolol succinate  (TOPROL-XL) 25 MG 24 hr tablet TAKE 1 TABLET(25 MG) BY MOUTH DAILY   . simethicone (MYLICON) 80 MG chewable tablet Chew 80 mg by mouth as needed for flatulence.   . ustekinumab (STELARA) 45 MG/0.5ML SOSY injection Inject 45 mg into the skin. 02/25/2018: Every 3 months  . [DISCONTINUED] Ascorbic Acid (VITA-C PO) Take by mouth daily.   . [DISCONTINUED] BIOTIN PO Take by mouth.   . [DISCONTINUED] calcium carbonate (TUMS EX) 750 MG chewable tablet Chew 1 tablet by mouth 2 (two) times daily. 02/25/2018: Takes 1 BID  . [DISCONTINUED] CHROMIUM PO Take by mouth.   . [DISCONTINUED] COPPER PO Take by mouth.   . clobetasol (TEMOVATE) 0.05 % external solution APPLY TO THE SKIN BID 09/15/2018: Uses on her scalp once/week  . EPINEPHrine (EPIPEN 2-PAK) 0.3 mg/0.3 mL IJ SOAJ injection Inject 0.3 mLs (0.3 mg total) into the muscle once. 06/23/2017: Carries but has not had to use it.  . loratadine (CLARITIN) 10 MG tablet Take 10 mg by mouth daily. 09/15/2018: Uses prn, only 1-2x/month  . [DISCONTINUED] FOLIC ACID PO Take by mouth.   . [DISCONTINUED] IODINE, KELP, PO Take by mouth.   . [DISCONTINUED] IRON PO Take by mouth. 02/25/2018: All part of her vitamins she takes  . [DISCONTINUED] LUTEIN PO Take by mouth.   . [DISCONTINUED] LYCOPENE PO Take by mouth.   . [DISCONTINUED] Magnesium 125 MG CAPS Take by mouth.   . [DISCONTINUED] MANGANESE PO Take by mouth.   . [DISCONTINUED] NIACIN PO Take by mouth.   . [DISCONTINUED] Omega-3 Fatty Acids (OMEGA-3 EPA FISH OIL PO) Take by mouth 2 (two) times daily.   . [DISCONTINUED] Pyridoxine HCl (VITAMIN B-6 PO) Take by mouth.   . [DISCONTINUED] ranitidine (ZANTAC) 75 MG tablet Take 75 mg by mouth 2 (two) times daily. 11/27/2016: Uses prn  . [DISCONTINUED] RIBOFLAVIN PO Take by mouth.   . [DISCONTINUED] SELENIUM PO Take by mouth.   . [DISCONTINUED] Thiamine HCl (THIAMINE PO) Take by mouth.   . [DISCONTINUED] VITAMIN A PO Take 2,000 Int'l Units by mouth daily.   . [DISCONTINUED]  VITAMIN E PO Take 60 Int'l Units by mouth daily.   . [DISCONTINUED] VITAMIN K PO Take by mouth.   . [DISCONTINUED] Zinc Sulfate (ZINC 15 PO) Take by mouth.    No facility-administered encounter medications on file as of 09/15/2018.    ROS: no fever, chills, URI symptoms, headaches, dizziness, chest pain, cough, shortness of breath, GI or GU complaints, bleeding, bruising. No significant joint pain. No edema. Moods are good. Psoriasis only on back, slowly improving. See HPI   PHYSICAL EXAM:  BP 130/80   Pulse 80   Ht 5' 3.5" (1.613 m)   Wt 189 lb 6.4 oz (85.9 kg)   BMI 33.02 kg/m   Wt Readings from Last 3 Encounters:  09/15/18 189 lb 6.4 oz (85.9 kg)  04/07/18 200 lb 3.2 oz (90.8 kg)  02/25/18 205 lb (93 kg)   Well appearing, pleasant female, in good spirits HEENT: conjunctiva and sclera are clear, EOMI, OP clear Neck: No lymphadenopathy, thyromegaly  or mass Heart: regular rate and rhythm Lungs: clear bilaterally Back: no spinal or CVA tenderness Abdomen: soft, nontender, no organomegaly or mass Extremities: no edema Skin: normal turgor, no visible rash (back not examined) Neuro: alert and oriented, cranial nerves intact, normal gait Psych: normal mood, affect, hygiene and grooming  Lab Results  Component Value Date   HGBA1C 6.2 (A) 09/15/2018     ASSESSMENT/PLAN:  Controlled type 2 diabetes mellitus with complication, without long-term current use of insulin (HCC) - A1c improved despite decreasing metformin, taking qHS; has lost wt. Change to 1038m with dinner. Cont wt loss - Plan: HgB A1c, Comprehensive metabolic panel  Hypertension associated with diabetes ([place] - improved/controlled. Cont current meds. Cont wt loss, try and resume exercise - Plan: Comprehensive metabolic panel, losartan (COZAAR) 50 MG tablet  Hyperlipidemia associated with type 2 diabetes mellitus ([place] - cont current regimen. if TG remains elevated, may need to restart omega-3  Psoriatic  arthritis ([place] - stable on current treatment  Subclinical hypothyroidism - asymptomatic, recheck TSH - Plan: TSH  Medication monitoring encounter - Plan: Comprehensive metabolic panel  Controlled type 2 diabetes mellitus without complication, without long-term current use of insulin ([place] - Plan: metFORMIN (GLUCOPHAGE-XR) 500 MG 24 hr tablet  Benign hypertension - Plan: metoprolol succinate (TOPROL-XL) 25 MG 24 hr tablet  Class 1 obesity due to excess calories with serious comorbidity and body mass index (BMI) of 33.0 to 33.9 in adult - congratulated on wt loss so far, continue   Wants to take all meds together at the same time.--change to taking it with dinner, rather than at bedtime.  She is no longer taking any vitamins.  H/o Vitamin D deficiency in the past. Recheck at her wellness visit.    Continue taking just 2 metformin daily (even though you used to take 3), but take it earlier--I recommend that you change taking al your medications to prior to dinner (or with dinner). That is a better time for the metformin than at bedtime.  It doesn't really matter much for the other medication.    F/u 6 mos for AWV/med check, fasting labs prior Vitamin D CBC, c-met, lipids A1c TSH depending on today's results

## 2018-09-15 ENCOUNTER — Ambulatory Visit (INDEPENDENT_AMBULATORY_CARE_PROVIDER_SITE_OTHER): Payer: Medicare Other | Admitting: Family Medicine

## 2018-09-15 ENCOUNTER — Encounter: Payer: Self-pay | Admitting: Family Medicine

## 2018-09-15 VITALS — BP 130/80 | HR 80 | Ht 63.5 in | Wt 189.4 lb

## 2018-09-15 DIAGNOSIS — E119 Type 2 diabetes mellitus without complications: Secondary | ICD-10-CM | POA: Diagnosis not present

## 2018-09-15 DIAGNOSIS — I10 Essential (primary) hypertension: Secondary | ICD-10-CM | POA: Diagnosis not present

## 2018-09-15 DIAGNOSIS — E1159 Type 2 diabetes mellitus with other circulatory complications: Secondary | ICD-10-CM | POA: Diagnosis not present

## 2018-09-15 DIAGNOSIS — E559 Vitamin D deficiency, unspecified: Secondary | ICD-10-CM | POA: Diagnosis not present

## 2018-09-15 DIAGNOSIS — E038 Other specified hypothyroidism: Secondary | ICD-10-CM

## 2018-09-15 DIAGNOSIS — E1169 Type 2 diabetes mellitus with other specified complication: Secondary | ICD-10-CM | POA: Diagnosis not present

## 2018-09-15 DIAGNOSIS — Z5181 Encounter for therapeutic drug level monitoring: Secondary | ICD-10-CM | POA: Diagnosis not present

## 2018-09-15 DIAGNOSIS — L4050 Arthropathic psoriasis, unspecified: Secondary | ICD-10-CM

## 2018-09-15 DIAGNOSIS — Z6833 Body mass index (BMI) 33.0-33.9, adult: Secondary | ICD-10-CM

## 2018-09-15 DIAGNOSIS — E785 Hyperlipidemia, unspecified: Secondary | ICD-10-CM

## 2018-09-15 DIAGNOSIS — E118 Type 2 diabetes mellitus with unspecified complications: Secondary | ICD-10-CM

## 2018-09-15 DIAGNOSIS — E6609 Other obesity due to excess calories: Secondary | ICD-10-CM

## 2018-09-15 DIAGNOSIS — E039 Hypothyroidism, unspecified: Secondary | ICD-10-CM

## 2018-09-15 LAB — POCT GLYCOSYLATED HEMOGLOBIN (HGB A1C): Hemoglobin A1C: 6.2 % — AB (ref 4.0–5.6)

## 2018-09-15 MED ORDER — METFORMIN HCL ER 500 MG PO TB24: 1000.0000 mg | ORAL_TABLET | Freq: Every day | ORAL | 0 refills | Status: DC

## 2018-09-15 MED ORDER — LOSARTAN POTASSIUM 50 MG PO TABS: 50.0000 mg | ORAL_TABLET | Freq: Every day | ORAL | 1 refills | Status: DC

## 2018-09-15 MED ORDER — METOPROLOL SUCCINATE ER 25 MG PO TB24: ORAL_TABLET | ORAL | 1 refills | Status: DC

## 2018-09-15 NOTE — Patient Instructions (Signed)
  Continue taking just 2 metformin daily (even though you used to take 3), but take it earlier--I recommend that you change taking al your medications to prior to dinner (or with dinner). That is a better time for the metformin than at bedtime.  It doesn't really matter much for the other medication.

## 2018-09-17 LAB — SPECIMEN STATUS

## 2018-09-17 LAB — COMPREHENSIVE METABOLIC PANEL
A/G RATIO: 1.7 (ref 1.2–2.2)
ALBUMIN: 4.5 g/dL (ref 3.5–4.8)
ALK PHOS: 77 IU/L (ref 39–117)
ALT: 12 IU/L (ref 0–32)
AST: 14 IU/L (ref 0–40)
BILIRUBIN TOTAL: 0.4 mg/dL (ref 0.0–1.2)
BUN / CREAT RATIO: 23 (ref 12–28)
BUN: 26 mg/dL (ref 8–27)
CHLORIDE: 103 mmol/L (ref 96–106)
CO2: 27 mmol/L (ref 20–29)
Calcium: 10.2 mg/dL (ref 8.7–10.3)
Creatinine, Ser: 1.13 mg/dL — ABNORMAL HIGH (ref 0.57–1.00)
GFR calc Af Amer: 56 mL/min/{1.73_m2} — ABNORMAL LOW (ref >59)
GFR calc non Af Amer: 49 mL/min/{1.73_m2} — ABNORMAL LOW (ref >59)
GLOBULIN, TOTAL: 2.7 g/dL (ref 1.5–4.5)
GLUCOSE: 94 mg/dL (ref 65–99)
POTASSIUM: 4.3 mmol/L (ref 3.5–5.2)
SODIUM: 144 mmol/L (ref 134–144)
Total Protein: 7.2 g/dL (ref 6.0–8.5)

## 2018-09-17 LAB — TSH: TSH: 4.05 u[IU]/mL (ref 0.450–4.500)

## 2018-09-29 DIAGNOSIS — Z6833 Body mass index (BMI) 33.0-33.9, adult: Secondary | ICD-10-CM | POA: Diagnosis not present

## 2018-09-29 DIAGNOSIS — L401 Generalized pustular psoriasis: Secondary | ICD-10-CM | POA: Diagnosis not present

## 2018-09-29 DIAGNOSIS — M150 Primary generalized (osteo)arthritis: Secondary | ICD-10-CM | POA: Diagnosis not present

## 2018-09-29 DIAGNOSIS — E669 Obesity, unspecified: Secondary | ICD-10-CM | POA: Diagnosis not present

## 2018-09-29 DIAGNOSIS — M79605 Pain in left leg: Secondary | ICD-10-CM | POA: Diagnosis not present

## 2018-09-29 DIAGNOSIS — L4050 Arthropathic psoriasis, unspecified: Secondary | ICD-10-CM | POA: Diagnosis not present

## 2018-09-29 DIAGNOSIS — R7989 Other specified abnormal findings of blood chemistry: Secondary | ICD-10-CM | POA: Diagnosis not present

## 2018-09-29 DIAGNOSIS — Z79899 Other long term (current) drug therapy: Secondary | ICD-10-CM | POA: Diagnosis not present

## 2018-09-29 DIAGNOSIS — E559 Vitamin D deficiency, unspecified: Secondary | ICD-10-CM | POA: Diagnosis not present

## 2018-10-17 ENCOUNTER — Other Ambulatory Visit: Payer: Self-pay | Admitting: Family Medicine

## 2018-10-17 DIAGNOSIS — E1159 Type 2 diabetes mellitus with other circulatory complications: Secondary | ICD-10-CM

## 2018-10-17 DIAGNOSIS — I10 Essential (primary) hypertension: Principal | ICD-10-CM

## 2018-10-19 DIAGNOSIS — L4050 Arthropathic psoriasis, unspecified: Secondary | ICD-10-CM | POA: Diagnosis not present

## 2018-11-24 ENCOUNTER — Other Ambulatory Visit: Payer: Self-pay | Admitting: Family Medicine

## 2019-01-14 DIAGNOSIS — L4050 Arthropathic psoriasis, unspecified: Secondary | ICD-10-CM | POA: Diagnosis not present

## 2019-01-20 ENCOUNTER — Encounter: Payer: Self-pay | Admitting: Family Medicine

## 2019-01-24 ENCOUNTER — Other Ambulatory Visit: Payer: Self-pay | Admitting: Family Medicine

## 2019-01-24 DIAGNOSIS — I152 Hypertension secondary to endocrine disorders: Secondary | ICD-10-CM

## 2019-01-24 DIAGNOSIS — E1159 Type 2 diabetes mellitus with other circulatory complications: Secondary | ICD-10-CM

## 2019-01-24 DIAGNOSIS — I10 Essential (primary) hypertension: Principal | ICD-10-CM

## 2019-03-01 ENCOUNTER — Other Ambulatory Visit: Payer: Self-pay

## 2019-03-01 ENCOUNTER — Telehealth: Payer: Self-pay

## 2019-03-01 ENCOUNTER — Other Ambulatory Visit: Payer: Self-pay | Admitting: Family Medicine

## 2019-03-01 DIAGNOSIS — E1159 Type 2 diabetes mellitus with other circulatory complications: Secondary | ICD-10-CM

## 2019-03-01 DIAGNOSIS — E119 Type 2 diabetes mellitus without complications: Secondary | ICD-10-CM

## 2019-03-01 DIAGNOSIS — I10 Essential (primary) hypertension: Secondary | ICD-10-CM

## 2019-03-01 MED ORDER — METFORMIN HCL ER 500 MG PO TB24: ORAL_TABLET | ORAL | Status: DC

## 2019-03-01 NOTE — Telephone Encounter (Signed)
Called in metformin due to it not e scribing. Pt was advised a 30 day supply was sent in and to advise office when she was running low . [place]

## 2019-03-02 NOTE — Telephone Encounter (Signed)
Dr. Tomi Bamberger is out of office and pt was suppose to have an appt tomorrow 5/7. I will refill

## 2019-03-03 ENCOUNTER — Ambulatory Visit: Payer: Medicare Other | Admitting: Family Medicine

## 2019-03-14 ENCOUNTER — Other Ambulatory Visit: Payer: Self-pay | Admitting: Family Medicine

## 2019-03-14 DIAGNOSIS — E1159 Type 2 diabetes mellitus with other circulatory complications: Secondary | ICD-10-CM

## 2019-03-14 DIAGNOSIS — I152 Hypertension secondary to endocrine disorders: Secondary | ICD-10-CM

## 2019-04-01 DIAGNOSIS — Z79899 Other long term (current) drug therapy: Secondary | ICD-10-CM | POA: Diagnosis not present

## 2019-04-01 DIAGNOSIS — M150 Primary generalized (osteo)arthritis: Secondary | ICD-10-CM | POA: Diagnosis not present

## 2019-04-01 DIAGNOSIS — L4050 Arthropathic psoriasis, unspecified: Secondary | ICD-10-CM | POA: Diagnosis not present

## 2019-04-01 DIAGNOSIS — M79605 Pain in left leg: Secondary | ICD-10-CM | POA: Diagnosis not present

## 2019-04-01 DIAGNOSIS — R7989 Other specified abnormal findings of blood chemistry: Secondary | ICD-10-CM | POA: Diagnosis not present

## 2019-04-01 DIAGNOSIS — E559 Vitamin D deficiency, unspecified: Secondary | ICD-10-CM | POA: Diagnosis not present

## 2019-04-01 DIAGNOSIS — L401 Generalized pustular psoriasis: Secondary | ICD-10-CM | POA: Diagnosis not present

## 2019-04-05 ENCOUNTER — Other Ambulatory Visit: Payer: Self-pay | Admitting: Family Medicine

## 2019-04-05 DIAGNOSIS — E7800 Pure hypercholesterolemia, unspecified: Secondary | ICD-10-CM

## 2019-04-14 ENCOUNTER — Other Ambulatory Visit: Payer: Self-pay | Admitting: Family Medicine

## 2019-04-14 DIAGNOSIS — I10 Essential (primary) hypertension: Secondary | ICD-10-CM

## 2019-04-15 DIAGNOSIS — L4050 Arthropathic psoriasis, unspecified: Secondary | ICD-10-CM | POA: Diagnosis not present

## 2019-05-12 ENCOUNTER — Telehealth: Payer: Self-pay | Admitting: Family Medicine

## 2019-05-12 NOTE — Telephone Encounter (Signed)
Pt called and would like to speak with Dr. 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

## 2019-05-12 NOTE — Telephone Encounter (Signed)
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  Needs notice by 7/20 if not able to be in person. 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   Letter written with info as below.  PLEASE PRINT AND MAIL THE LETTER (that was already written, in communications) TO THE SCHOOL, AS IT IS DUE 7/20.   Mr. Audie Clear principal  [address] [place] [address] [place], [place] [address]  April Matthews is under my medical care.  She has multiple risk factors for developing complications if she contracts COVID-19.  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  Please feel free to call me with any question or concerns.

## 2019-06-01 NOTE — Progress Notes (Signed)
Chief Complaint  Patient presents with  . Medicare Wellness    fasting AWV/med check with pap. Has a place on her back near bra area that feels bruised x 8 months, but it's not bruised.Is scheduled for diabetic eye exam next week but she is going to cancel it.  Needs new epipen.    April Matthews is a 73 y.o. female who presents for annual wellness visit and follow-up on chronic medical conditions.  She has the following concerns:  She reports having an area on her left lateral mid-upper back that intermittently feels sore or bruised.  No known injury.  Sometimes has more diffuse muscular soreness, other times is just a tiny spot that is sore, and times when there is no tenderness at all, off and on over the last 8 months or so.  Diabetes: She doesn't check blood sugars. Since 08/2018, she has been taking 1000mg  of metformin with dinner (previously had taking 1500mg  daily, inadvertantly decreased to 1000mg  prior to last visit, and A1c remained okay, so kept it there). Denies any numbness or tingling. Lastdiabeticeye exam was5/2019, no retinopathy. (saw Dr. Ricki Miller 07/2018) Checks feet regularly, no concerns. Denies polydipsia, polyuria Lab Results  Component Value Date   HGBA1C 6.2 (A) 09/15/2018   Hypertension: She is compliant with taking losartan 50mg  and toprol XL 25mg  daily. She hasn't been checking BP's at all, had all been good and she feels good. Her monitor was verified as accurate 12/2016. She denies headaches, dizziness, chest pain, palpitations, shortness of breath. She no longer has any swelling, doesn't need to wear any compresion sock any longer.  Obesity: She continues to do intermittent fasting (previously followed Diabetes Code diet. She currently has a 6 hour eating window.  She has regained weight--eating more sweets/sugar (husband bringing it home) having it more regularly.  She was able to stop the gain, but hasn't been able to lose any. She denies getting  headaches, feeling weak. No longer drinks coffee, but switched to tea (doesn't bother her stomach or cause boils as coffee had done). She continues to have good energy, doesn't feel hungry, eats small portions. Can be around others eating and not mind not eating.  Getting 10K daily, tracking on her Fitbit. She got down 2 pants sizes, hasn't gone back up despite the weight gain.  Starting to notice a little gain at her belly, which she has started losing again recently.  Hyperlipidemia: Tolerating atorvastatin without side effects. Trying to follow lowfat, low cholesterol diet. She has taken fish oil in the past, not currently  Lab Results  Component Value Date   CHOL 124 02/25/2018   HDL 50 02/25/2018   LDLCALC 39 02/25/2018   TRIG 174 (H) 02/25/2018   CHOLHDL 2.5 02/25/2018    She has had some borderline thyroid tests in the past. She denies changes in hair/skin/bowels or energy. Weight gain as above. Lab Results  Component Value Date   TSH 4.050 09/15/2018   Psoriatic arthritis:Under the care ofDr. Amil Amen.Doing well on her current regimen (Stelara).Denies any pain. Coverage/insurance will be changing for the Stelara (no longer will be free by the end of the year).  Immunization History  Administered Date(s) Administered  . Influenza Split 09/08/2012, 08/10/2014, 08/21/2015  . Influenza, High Dose Seasonal PF 07/10/2016, 07/13/2017, 07/20/2018  . PPD Test 09/08/2012  . Pneumococcal Conjugate-13 04/20/2014  . Pneumococcal Polysaccharide-23 04/23/2015  . Tdap 06/19/2011   Last Pap smear: 03/2014 normal, no high risk HPV detected Last mammogram: 10/2017  Last  colonoscopy: 07/2014 mild diverticulosis; Dr. Olevia Perches; repeat 10 years Last DEXA: 10/2017 T-1.7 L femoral neck Dentist: goestwiceyearly Ophtho: goes yearly; also sees retinal specialist Exercise:Walks 30 minutes, at least 4 times/day (inside her home). Does some exercise bike as well, not on a routine. They didn't  open their pool this year (needed costly repairs) Vitamin D-OH screen: 03/2016 normal at 48; no longer taking vitamins/supplements she was then  Other doctors caring for patient include: Dr. Amil Amen (rheum) Dr. Amy Martinique (derm) Dr. Kathrin Penner (ophtho) Dr. Oval Linsey (retinal [place]ologistat [place] [facility])  [place] in past, but she left. Dr.Cowan(dentist) Dr. Olevia Perches (retired--GI)  Depression screen: Negative Functional statusscreen: unremarkable Fall screen: negative. Mini-Cog screen: normal (score of 5)  End of Life Discussion: Patient hasa medical power of attorney (her daughter) and Living Will.  Past Medical History:  Diagnosis Date  . Abnormal mammogram 9/07   Right breast--bx sclerosing papilloma  . Abnormal mammogram 02/2009   Left; suspicious abnormality L breast mammo and u/s.  Biopsy recommended but patient never did nor f/u mammo  . Abnormal TSH 2015   negative Ab's; borderline on repeat  . Arthritis    psoriatic - digits  . Diabetes type 2, controlled ([place])    A1c 6.5 07/2014 (had been prediabetic prior)  . Diverticulosis 07/2014   mild, noted on colonoscopy  . DM (diabetes mellitus), gestational    with first pregnancy (stillborn)--based on autopsy; not diagnosed during pregnancy  . GERD (gastroesophageal reflux disease)   . Heart murmur   . History of kidney stones   . HTN (hypertension)   . Kidney stone 1997  . Osteopenia 7/07   mild  . Psoriatic arthritis ([place])    on Stelara (Dr. Amil Amen); previously took Remicade  . Seasonal allergies   . Varicose veins   . Venous insufficiency   . Vitamin D deficiency 12/09    Past Surgical History:  Procedure Laterality Date  . BREAST BIOPSY Right 9/07   right--sclerosing papilloma needle biopsy only  . [place]  . INSERTION OF MESH N/A 06/30/2017   Procedure: INSERTION OF MESH PATCH;  Surgeon: Armandina Gemma, MD;  Location: [facility];  Service: General;  Laterality:  N/A;  . TONSILLECTOMY  age 60  . TONSILLECTOMY AND ADENOIDECTOMY    . UMBILICAL HERNIA REPAIR N/A 06/30/2017   Procedure: HERNIA REPAIR UMBILICAL;  Surgeon: Armandina Gemma, MD;  Location: [facility];  Service: General;  Laterality: N/A;    Social History   Socioeconomic History  . Marital status: Married    Spouse name: Not on file  . Number of children: Not on file  . Years of education: Not on file  . Highest education level: Not on file  Occupational History  . 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 Financial resource strain: Not on file  . Food insecurity    Worry: Not on file    Inability: Not on file  . Transportation needs    Medical: Not on file    Non-medical: Not on file  Tobacco Use  . Smoking status: Never Smoker  . Smokeless tobacco: Never Used  Substance and Sexual Activity  . Alcohol use: Yes    Alcohol/week: 0.0 standard drinks    Comment: none  . Drug use: No  . Sexual activity: Yes    Partners: Male  Lifestyle  . Physical activity    Days per week: Not on file    Minutes per session: Not  on file  . Stress: Not on file  Relationships  . Social Herbalist on phone: Not on file    Gets together: Not on file    Attends religious service: Not on file    Active member of club or organization: Not on file    Attends meetings of clubs or organizations: Not on file    Relationship status: Not on file  . Intimate partner violence    Fear of current or ex partner: Not on file    Emotionally abused: Not on file    Physically abused: Not on file    Forced sexual activity: Not on file  Other Topics Concern  . Not on file  Social History Narrative   Lives with husband. Children live in [place], [place], [place], and [place], [place]. 4 grandchildren.   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 When that teacher retired, she  restarted 32 hours/week (Monday through [place], half day on Wed) for the 2019-2020 school year.   20-21 school year--29 hours/week, will be working remotely (children will be in-class)    Family History  Problem Relation Age of Onset  . Stroke Mother   . Hypertension Mother   . Cancer Mother        SKIN CANCER  . GER disease Father   . Heart disease Brother   . Diabetes Brother   . Cancer Brother        prostate  . Cancer Maternal Aunt        breast cancer late 50's  . Cancer Maternal Grandmother        breast cancer 60's  . Colon cancer Neg Hx   . Rectal cancer Neg Hx   . Stomach cancer Neg Hx     Outpatient Encounter Medications as of 06/02/2019  Medication Sig Note  . atorvastatin (LIPITOR) 20 MG tablet TAKE 1 TABLET(20 MG) BY MOUTH DAILY   . clobetasol (TEMOVATE) 0.05 % external solution APPLY TO THE SKIN BID 09/15/2018: Uses on her scalp once/week  . ipratropium (ATROVENT) 0.03 % nasal spray USE 2 SPRAYS IN EACH NOSTRIL EVERY 12 HOURS   . loratadine (CLARITIN) 10 MG tablet Take 10 mg by mouth daily. 09/15/2018: Uses prn, only 1-2x/month  . losartan (COZAAR) 25 MG tablet TAKE 1 TABLET(25 MG) BY MOUTH DAILY (Patient taking differently: Take 50 mg by mouth daily. TAKE 1 TABLET(25 MG) BY MOUTH DAILY)   . metFORMIN (GLUCOPHAGE-XR) 500 MG 24 hr tablet TAKE 3 TABLETS BY MOUTH WITH BREAKFAST (Patient taking differently: Take 1,000 mg by mouth daily with breakfast. )   . metoprolol succinate (TOPROL-XL) 25 MG 24 hr tablet TAKE 1 TABLET(25 MG) BY MOUTH DAILY   . ustekinumab (STELARA) 45 MG/0.5ML SOSY injection Inject 45 mg into the skin. 02/25/2018: Every 3 months  . [DISCONTINUED] Cyanocobalamin (VITAMIN B12 PO) Take by mouth.   . EPINEPHrine (EPIPEN 2-PAK) 0.3 mg/0.3 mL IJ SOAJ injection Inject 0.3 mLs (0.3 mg total) into the muscle once. (Patient not taking: Reported on 09/15/2018) 06/23/2017: Carries but has not had to use it.  . simethicone (MYLICON) 80 MG chewable tablet Chew 80 mg by  mouth as needed for flatulence.    No facility-administered encounter medications on file as of 06/02/2019.     Allergies  Allergen Reactions  . Sesame Oil Anaphylaxis    Sesame seed/oil  . Ace Inhibitors Cough  . Latex Rash  ROS: The patient denies anorexia, fever, headaches, vision changes, hearing changes, ear pain, sore throat, breast concerns, chest pain, palpitations, dizziness, syncope, dyspnea on exertion, cough, swelling, nausea, vomiting, diarrhea, constipation, abdominal pain, melena, hematochezia, indigestion/heartburn, hematuria, incontinence, dysuria, vaginal bleeding, discharge, odor or itch, genital lesions, numbness, tingling, weakness, tremor, suspicious skin lesions, depression, anxiety, abnormal bleeding/bruising, or enlarged lymph nodes. Currently denies any joint pains. Psoriasis on back is stable, not improving or worsening. +weight gain per HPI due to dietary indiscretion Lateral chest wall pain on left per HPI.   PHYSICAL EXAM:  BP (!) 150/80   Pulse 64   Temp 97.7 F (36.5 C) (Temporal)   Ht 5\' 3"  (1.6 m)   Wt 197 lb 12.8 oz (89.7 kg)   BMI 35.04 kg/m   Wt Readings from Last 3 Encounters:  06/02/19 197 lb 12.8 oz (89.7 kg)  09/15/18 189 lb 6.4 oz (85.9 kg)  04/07/18 200 lb 3.2 oz (90.8 kg)    General Appearance:   Alert, cooperative, no distress, appears stated age  Head:   Normocephalic, without obvious abnormality, atraumatic  Eyes:   PERRL, conjunctiva/corneas clear, EOM's intact, fundi benign  Ears:   Normal TM's and external ear canals  Nose:  Not examined (wearing mask due to COVID-19 pandemic)  Throat:  Not examined (wearing mask due to COVID-19 pandemic)  Neck:  Supple, no lymphadenopathy; thyroid: noenlargement/ tenderness/nodules; no JVD.  No bruit appreciable   Back:  Spine nontender, no curvature, ROM normal, no CVA tenderness  Lungs:   Clear to auscultation bilaterally without wheezes, rales or ronchi;  respirations unlabored  Chest Wall:   No deformity.  There is a very small/focal area of tenderness at left lateral chest wall, more tender along the rib than intercostal muscle.  No overlying visible abnormality  Heart:   Regular rate and rhythm, S1 and S2 normal, no rub or gallop, 2/6 SEM  Breast Exam:   No tenderness, masses, or nipple discharge or inversion. No axillary lymphadenopathy  Abdomen:   Soft, non-tender, nondistended, normoactive bowel sounds,   no masses, no hepatosplenomegaly. Obese. WHSS.  Genitalia:   Normal external genitalia without lesions, +atrophic changes. BUS and vagina normal; no cervical lesions, discharge or cervical motion tenderness. Stenotic cervical os. No abnormal vaginal discharge. Uterus and adnexa not enlarged, but exam is somewhat limited by her body habitus; adnexa nontender, no masses. Pap performed.   Rectal:   Normal tone, no masses or tenderness; guaiac negative stool  Extremities:  No clubbing, cyanosis or edema  Pulses:  2+ and symmetric all extremities  Skin:  Skin turgor normal, no suspicious lesions. Venous stasis hyperpigmentation of lower shins bilaterally. Very small psoriatic plaque in central lower abdomen (suprapubic area), more extensive plaques along lower back.  Lymph nodes:  Cervical, supraclavicular, and axillary nodes normal  Neurologic:  CNII-XII intact, normal strength, sensation and gait; reflexes 2+ and symmetric throughout  Psych: Normal mood, affect, hygiene and grooming.        Normal diabetic foot exam.  Lab Results  Component Value Date   HGBA1C 6.2 (A) 06/02/2019     ASSESSMENT/PLAN:  Medicare annual wellness visit, subsequent   Controlled type 2 diabetes mellitus with complication, without long-term current use of insulin ([place]) - remains controlled; encouraged to limit sugar in diet and lose weight - Plan: HgB A1c, Comprehensive metabolic  panel  Hypertension associated with diabetes ([place]) - elevated today; to monitor more regularly at home, limit sodium in diet, lose weight; to report  her BP's to [place] in a few weeks - Plan: Comprehensive metabolic panel, losartan (COZAAR) 50 MG tablet  Hyperlipidemia associated with type 2 diabetes mellitus ([place]) - Plan: Lipid panel  Psoriatic arthritis ([place]) - doing well on current regimen; has persistent plaques on her back, stable   Subclinical hypothyroidism - remains asymptomatic, due for recheck   Medication monitoring encounter - Plan: Comprehensive metabolic panel, Lipid panel, CBC with Differential/Platelet, VITAMIN D 25 Hydroxy (Vit-D Deficiency, Fractures)  Vitamin D deficiency - no longer on daily supplement, recheck - Plan: VITAMIN D 25 Hydroxy (Vit-D Deficiency, Fractures), metFORMIN (GLUCOPHAGE-XR) 500 MG 24 hr tablet  Food allergy - Plan: EPINEPHrine 0.3 mg/0.3 mL IJ SOAJ injection  Screening for cervical cancer - Plan: Cytology - PAP([facility])  Visit for gynecologic examination  Had 2 sips of gatorade prior to blood draw today  Diabetic eye exam--scheduled for next week --pt stated she was going to cancel, encouraged not to    Discussed monthly self breast exams and yearly mammograms (past due). Recommended at least 30 minutes of aerobic activity at least 5 days/week, weight-bearing exercise 2x/week; proper sunscreen use reviewed; healthy diet, including goals of calcium and vitamin D intake and alcohol recommendations (less than or equal to 1 drink/day) reviewed; regular seatbelt use; changing batteries in smoke detectors. Immunization recommendations discussed-- continue yearly high dose flu shots. Shingrix recommended, risks/side effects reviewed, to get from pharmacy.Colonoscopy is UTD.   End of Life: Asked for copies of Living Will and healthcare power of attorney. Full Code, full care   Medicare Attestation I have personally reviewed: The patient's  medical and social history Their use of alcohol, tobacco or illicit drugs Their current medications and supplements The patient's functional ability including ADLs,fall risks, home safety risks, cognitive, and hearing and visual impairment Diet and physical activities Evidence for depression or mood disorders  The patient's weight, height, BMI, and visual acuity have been recorded in the chart.  I have made referrals, counseling, and provided education to the patient based on review of the above and I have provided the patient with a written personalized care plan for preventive services.

## 2019-06-01 NOTE — Patient Instructions (Addendum)
  HEALTH MAINTENANCE RECOMMENDATIONS:  It is recommended that you get at least 30 minutes of aerobic exercise at least 5 days/week (for weight loss, you may need as much as 60-90 minutes). This can be any activity that gets your heart rate up. This can be divided in 10-15 minute intervals if needed, but try and build up your endurance at least once a week.  Weight bearing exercise is also recommended twice weekly.  Eat a healthy diet with lots of vegetables, fruits and fiber.  "Colorful" foods have a lot of vitamins (ie green vegetables, tomatoes, red peppers, etc).  Limit sweet tea, regular sodas and alcoholic beverages, all of which has a lot of calories and sugar.  Up to 1 alcoholic drink daily may be beneficial for women (unless trying to lose weight, watch sugars).  Drink a lot of water.  Calcium recommendations are 1200-1500 mg daily (1500 mg for postmenopausal women or women without ovaries), and vitamin D 1000 IU daily.  This should be obtained from diet and/or supplements (vitamins), and calcium should not be taken all at once, but in divided doses.  Monthly self breast exams and yearly mammograms for women over the age of 84 is recommended.  Sunscreen of at least SPF 30 should be used on all sun-exposed parts of the skin when outside between the hours of 10 am and 4 pm (not just when at beach or pool, but even with exercise, golf, tennis, and yard work!)  Use a sunscreen that says "broad spectrum" so it covers both UVA and UVB rays, and make sure to reapply every 1-2 hours.  Remember to change the batteries in your smoke detectors when changing your clock times in the spring and fall. Carbon monoxide detectors are recommended for your home.  Use your seat belt every time you are in a car, and please drive safely and not be distracted with cell phones and texting while driving.   April Matthews , Thank you for taking time to come for your Medicare Wellness Visit. I appreciate your ongoing  commitment to your health goals. Please review the following plan we discussed and let me know if I can assist you in the future.   This is a list of the screening recommended for you and due dates:  Health Maintenance  Topic Date Due  . Complete foot exam   02/26/2019  . Eye exam for diabetics  03/09/2019  . Hemoglobin A1C  03/16/2019  . Flu Shot  05/28/2019  . Mammogram  11/19/2019  . Tetanus Vaccine  06/18/2021  . Colon Cancer Screening  08/25/2024  . DEXA scan (bone density measurement)  Completed  .  Hepatitis C: One time screening is recommended by Center for Disease Control  (CDC) for  adults born from [birth year] through 1965.   Completed  . Pneumonia vaccines  Completed   Foot exam and A1c were done today. Please schedule your diabetic eye exam. Get your high dose flu shot in September/October (ignore 05/28/19 date above)  I recommend getting the new shingles vaccine (Shingrix). It should be covered by Medicare Part D, so you need to get from the pharmacy rather than our office.  It is a series of 2 injections, spaced 2 months apart.  Your blood pressure was elevated today.  Please monitor at home and send [place] the results within the next few weeks. Be sure to limit the sodium in your diet.

## 2019-06-02 ENCOUNTER — Ambulatory Visit (INDEPENDENT_AMBULATORY_CARE_PROVIDER_SITE_OTHER): Payer: Medicare Other | Admitting: Family Medicine

## 2019-06-02 ENCOUNTER — Encounter: Payer: Self-pay | Admitting: Family Medicine

## 2019-06-02 ENCOUNTER — Other Ambulatory Visit (HOSPITAL_COMMUNITY)
Admission: RE | Admit: 2019-06-02 | Discharge: 2019-06-02 | Disposition: A | Discharge status: Home / Self Care | Payer: Medicare Other | Source: Ambulatory Visit | Attending: Family Medicine | Admitting: Family Medicine

## 2019-06-02 ENCOUNTER — Other Ambulatory Visit: Payer: Self-pay

## 2019-06-02 VITALS — BP 150/80 | HR 64 | Temp 97.7°F | Ht 63.0 in | Wt 197.8 lb

## 2019-06-02 DIAGNOSIS — E118 Type 2 diabetes mellitus with unspecified complications: Secondary | ICD-10-CM | POA: Diagnosis not present

## 2019-06-02 DIAGNOSIS — Z780 Asymptomatic menopausal state: Secondary | ICD-10-CM | POA: Diagnosis not present

## 2019-06-02 DIAGNOSIS — L4050 Arthropathic psoriasis, unspecified: Secondary | ICD-10-CM | POA: Diagnosis not present

## 2019-06-02 DIAGNOSIS — E1159 Type 2 diabetes mellitus with other circulatory complications: Secondary | ICD-10-CM

## 2019-06-02 DIAGNOSIS — Z124 Encounter for screening for malignant neoplasm of cervix: Secondary | ICD-10-CM | POA: Diagnosis not present

## 2019-06-02 DIAGNOSIS — Z1151 Encounter for screening for human papillomavirus (HPV): Secondary | ICD-10-CM | POA: Diagnosis not present

## 2019-06-02 DIAGNOSIS — E1169 Type 2 diabetes mellitus with other specified complication: Secondary | ICD-10-CM

## 2019-06-02 DIAGNOSIS — E559 Vitamin D deficiency, unspecified: Secondary | ICD-10-CM | POA: Diagnosis not present

## 2019-06-02 DIAGNOSIS — I152 Hypertension secondary to endocrine disorders: Secondary | ICD-10-CM

## 2019-06-02 DIAGNOSIS — E038 Other specified hypothyroidism: Secondary | ICD-10-CM

## 2019-06-02 DIAGNOSIS — Z0000 Encounter for general adult medical examination without abnormal findings: Secondary | ICD-10-CM | POA: Diagnosis not present

## 2019-06-02 DIAGNOSIS — Z01419 Encounter for gynecological examination (general) (routine) without abnormal findings: Secondary | ICD-10-CM | POA: Diagnosis not present

## 2019-06-02 DIAGNOSIS — E785 Hyperlipidemia, unspecified: Secondary | ICD-10-CM

## 2019-06-02 DIAGNOSIS — Z5181 Encounter for therapeutic drug level monitoring: Secondary | ICD-10-CM | POA: Diagnosis not present

## 2019-06-02 DIAGNOSIS — Z91018 Allergy to other foods: Secondary | ICD-10-CM

## 2019-06-02 DIAGNOSIS — E039 Hypothyroidism, unspecified: Secondary | ICD-10-CM | POA: Diagnosis not present

## 2019-06-02 DIAGNOSIS — I10 Essential (primary) hypertension: Secondary | ICD-10-CM | POA: Diagnosis not present

## 2019-06-02 LAB — POCT GLYCOSYLATED HEMOGLOBIN (HGB A1C): Hemoglobin A1C: 6.2 % — AB (ref 4.0–5.6)

## 2019-06-02 MED ORDER — EPINEPHRINE 0.3 MG/0.3ML IJ SOAJ: 0.3000 mg | INTRAMUSCULAR | 1 refills | Status: DC | PRN

## 2019-06-02 MED ORDER — LOSARTAN POTASSIUM 50 MG PO TABS: 50.0000 mg | ORAL_TABLET | Freq: Every day | ORAL | 1 refills | Status: DC

## 2019-06-02 MED ORDER — METFORMIN HCL ER 500 MG PO TB24: 1000.0000 mg | ORAL_TABLET | Freq: Every day | ORAL | 1 refills | Status: DC

## 2019-06-03 LAB — COMPREHENSIVE METABOLIC PANEL
ALT: 14 IU/L (ref 0–32)
AST: 13 IU/L (ref 0–40)
Albumin/Globulin Ratio: 1.6 (ref 1.2–2.2)
Albumin: 4.7 g/dL (ref 3.7–4.7)
Alkaline Phosphatase: 88 IU/L (ref 39–117)
BUN/Creatinine Ratio: 21 (ref 12–28)
BUN: 24 mg/dL (ref 8–27)
Bilirubin Total: 0.4 mg/dL (ref 0.0–1.2)
CO2: 23 mmol/L (ref 20–29)
Calcium: 9.9 mg/dL (ref 8.7–10.3)
Chloride: 104 mmol/L (ref 96–106)
Creatinine, Ser: 1.16 mg/dL — ABNORMAL HIGH (ref 0.57–1.00)
GFR calc Af Amer: 54 mL/min/{1.73_m2} — ABNORMAL LOW (ref >59)
GFR calc non Af Amer: 47 mL/min/{1.73_m2} — ABNORMAL LOW (ref >59)
Globulin, Total: 2.9 g/dL (ref 1.5–4.5)
Glucose: 110 mg/dL — ABNORMAL HIGH (ref 65–99)
Potassium: 4.4 mmol/L (ref 3.5–5.2)
Sodium: 142 mmol/L (ref 134–144)
Total Protein: 7.6 g/dL (ref 6.0–8.5)

## 2019-06-03 LAB — CBC WITH DIFFERENTIAL/PLATELET
Basophils Absolute: 0.1 10*3/uL (ref 0.0–0.2)
Basos: 1 %
EOS (ABSOLUTE): 0.3 10*3/uL (ref 0.0–0.4)
Eos: 3 %
Hematocrit: 40.6 % (ref 34.0–46.6)
Hemoglobin: 13.6 g/dL (ref 11.1–15.9)
Immature Grans (Abs): 0 10*3/uL (ref 0.0–0.1)
Immature Granulocytes: 0 %
Lymphocytes Absolute: 2.6 10*3/uL (ref 0.7–3.1)
Lymphs: 33 %
MCH: 31.6 pg (ref 26.6–33.0)
MCHC: 33.5 g/dL (ref 31.5–35.7)
MCV: 94 fL (ref 79–97)
Monocytes Absolute: 0.6 10*3/uL (ref 0.1–0.9)
Monocytes: 7 %
Neutrophils Absolute: 4.4 10*3/uL (ref 1.4–7.0)
Neutrophils: 56 %
Platelets: 346 10*3/uL (ref 150–450)
RBC: 4.31 x10E6/uL (ref 3.77–5.28)
RDW: 13 % (ref 11.7–15.4)
WBC: 8 10*3/uL (ref 3.4–10.8)

## 2019-06-03 LAB — VITAMIN D 25 HYDROXY (VIT D DEFICIENCY, FRACTURES): Vit D, 25-Hydroxy: 32.5 ng/mL (ref 30.0–100.0)

## 2019-06-03 LAB — LIPID PANEL
Chol/HDL Ratio: 2.5 ratio (ref 0.0–4.4)
Cholesterol, Total: 141 mg/dL (ref 100–199)
HDL: 56 mg/dL (ref >39)
LDL Calculated: 59 mg/dL (ref 0–99)
Triglycerides: 128 mg/dL (ref 0–149)
VLDL Cholesterol Cal: 26 mg/dL (ref 5–40)

## 2019-06-07 LAB — CYTOLOGY - PAP
Diagnosis: NEGATIVE
HPV: NOT DETECTED

## 2019-07-08 DIAGNOSIS — L4050 Arthropathic psoriasis, unspecified: Secondary | ICD-10-CM | POA: Diagnosis not present

## 2019-07-11 ENCOUNTER — Other Ambulatory Visit: Payer: Self-pay | Admitting: Family Medicine

## 2019-07-11 DIAGNOSIS — I10 Essential (primary) hypertension: Secondary | ICD-10-CM

## 2019-07-11 DIAGNOSIS — E559 Vitamin D deficiency, unspecified: Secondary | ICD-10-CM

## 2019-07-25 ENCOUNTER — Other Ambulatory Visit: Payer: Self-pay | Admitting: Family Medicine

## 2019-07-25 DIAGNOSIS — E1159 Type 2 diabetes mellitus with other circulatory complications: Secondary | ICD-10-CM

## 2019-08-03 ENCOUNTER — Telehealth: Payer: Self-pay | Admitting: Family Medicine

## 2019-08-03 NOTE — Telephone Encounter (Signed)
Okay for return to work note.  I'd like for her to continue to have the option to work from home if there is any concern about exposure in the workplace, as she is high risk if she were to get COVID-19.

## 2019-08-03 NOTE — Telephone Encounter (Signed)
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 She stats its been 6 weeks without outbreak and she feels ok about going back. She needs a note prior to principal. Pt can be reached at [phone].

## 2019-08-04 ENCOUNTER — Encounter: Payer: Self-pay | Admitting: Family Medicine

## 2019-08-04 NOTE — Telephone Encounter (Signed)
Work note completed.

## 2019-08-16 DIAGNOSIS — I872 Venous insufficiency (chronic) (peripheral): Secondary | ICD-10-CM | POA: Diagnosis not present

## 2019-08-16 DIAGNOSIS — I788 Other diseases of capillaries: Secondary | ICD-10-CM | POA: Diagnosis not present

## 2019-08-16 DIAGNOSIS — D1801 Hemangioma of skin and subcutaneous tissue: Secondary | ICD-10-CM | POA: Diagnosis not present

## 2019-08-16 DIAGNOSIS — L400 Psoriasis vulgaris: Secondary | ICD-10-CM | POA: Diagnosis not present

## 2019-08-16 DIAGNOSIS — D225 Melanocytic nevi of trunk: Secondary | ICD-10-CM | POA: Diagnosis not present

## 2019-08-16 DIAGNOSIS — I8311 Varicose veins of right lower extremity with inflammation: Secondary | ICD-10-CM | POA: Diagnosis not present

## 2019-08-16 DIAGNOSIS — I8312 Varicose veins of left lower extremity with inflammation: Secondary | ICD-10-CM | POA: Diagnosis not present

## 2019-12-07 ENCOUNTER — Ambulatory Visit: Payer: Medicare Other | Admitting: Family Medicine

## 2019-12-08 ENCOUNTER — Ambulatory Visit: Payer: Medicare Other

## 2019-12-12 ENCOUNTER — Other Ambulatory Visit: Payer: Self-pay | Admitting: Family Medicine

## 2019-12-12 DIAGNOSIS — E1159 Type 2 diabetes mellitus with other circulatory complications: Secondary | ICD-10-CM

## 2019-12-12 DIAGNOSIS — I152 Hypertension secondary to endocrine disorders: Secondary | ICD-10-CM

## 2019-12-21 ENCOUNTER — Ambulatory Visit: Payer: Medicare Other | Admitting: Family Medicine

## 2020-01-02 ENCOUNTER — Other Ambulatory Visit: Payer: Self-pay | Admitting: Family Medicine

## 2020-01-02 DIAGNOSIS — E559 Vitamin D deficiency, unspecified: Secondary | ICD-10-CM

## 2020-01-02 DIAGNOSIS — I10 Essential (primary) hypertension: Secondary | ICD-10-CM

## 2020-01-06 DIAGNOSIS — L4050 Arthropathic psoriasis, unspecified: Secondary | ICD-10-CM | POA: Diagnosis not present

## 2020-01-06 DIAGNOSIS — Z79899 Other long term (current) drug therapy: Secondary | ICD-10-CM | POA: Diagnosis not present

## 2020-01-08 ENCOUNTER — Other Ambulatory Visit: Payer: Self-pay | Admitting: Family Medicine

## 2020-01-25 DIAGNOSIS — H52203 Unspecified astigmatism, bilateral: Secondary | ICD-10-CM | POA: Diagnosis not present

## 2020-01-25 DIAGNOSIS — H25813 Combined forms of age-related cataract, bilateral: Secondary | ICD-10-CM | POA: Diagnosis not present

## 2020-01-25 DIAGNOSIS — D3132 Benign neoplasm of left choroid: Secondary | ICD-10-CM | POA: Diagnosis not present

## 2020-01-25 DIAGNOSIS — E119 Type 2 diabetes mellitus without complications: Secondary | ICD-10-CM | POA: Diagnosis not present

## 2020-01-25 LAB — HM DIABETES EYE EXAM

## 2020-01-30 ENCOUNTER — Encounter: Payer: Self-pay | Admitting: *Deleted

## 2020-01-31 NOTE — Progress Notes (Signed)
Chief Complaint  Patient presents with  . Diabetes    fasting med check. No new concerns. Dr Amil Amen did labs a month ago.   Patient presents for follow-up on chronic problems. Last seen in office 05/2019  Diabetes: She doesn't check blood sugars.  She is compliant with taking 1000mg  of metformin with dinner Denies any numbness or tingling. Lastdiabeticeye exam was 01/25/2020.  She checks feet regularly, no concerns. Denies polydipsia, polyuria Lab Results  Component Value Date   HGBA1C 6.2 (A) 06/02/2019   Hypertension:She is compliant with taking losartan50mg  and toprol XL 25mg  daily. (some confusion--looks like RF'd 50mg  in August, but then changed to 25mg  in September--likely related to not being available, but error in rx and only rx'd to take 25mg  once daily, rather than taking 2 tablets; most recently refilled 50mg  in 11/2019, taking 1 daily.). She hasn't been checking her BP elsewhere.   Monitor has been verified as accurate in 12/2016. She denies headaches, dizziness, chest pain, palpitations, shortness of breath.  She denies edema, no longer needs to wear compresion socks. She is walking 10K steps/day (about an hour/day), walks around her house.  Obesity:She continues to do intermittent fasting (previously followed Diabetes Code diet).She was fasting 16 hours, but wasn't losing weight, and gained some during COVID.  She got her Diabetes Code book back out, and changed to just 1 meal/day.  Her eating window was shortened to 3.5 hours. On Sundays she doesn't fast--eats a brunch and dinner (2 meals). She has lost 7# since doing this.  Hyperlipidemia: Tolerating atorvastatin without side effects. Trying to follow lowfat, low cholesterol diet. She has taken fish oil in the past, not currently. Lipids were at goal on last check: Lab Results  Component Value Date   CHOL 141 06/02/2019   HDL 56 06/02/2019   LDLCALC 59 06/02/2019   TRIG 128 06/02/2019   CHOLHDL 2.5 06/02/2019    She has had some borderline thyroid tests in the past. She denies changes in hair/skin/bowels or energy. She believes that Dr. Amil Amen may have checked TSH more recently, and not told that anything was abnormal. Lab Results  Component Value Date   TSH 4.050 09/15/2018   Psoriatic arthritis:Under the care ofDr. Amil Amen.Doing well on her current regimen (Stelara).Her last shot was in March.  She has discomfort in her joints for a few days after the injection, but otherwise doing very well. The psoriatic patch on her back is still there, but shrinking. It isn't itchy or symptomatic.  Under the care of Dr. Martinique for her skin.   PMH, PSH, SH reviewed  Outpatient Encounter Medications as of 02/01/2020  Medication Sig Note  . atorvastatin (LIPITOR) 20 MG tablet TAKE 1 TABLET(20 MG) BY MOUTH DAILY   . Cholecalciferol (VITAMIN D3) 25 MCG (1000 UT) CAPS Take 1 capsule by mouth daily.   Marland Kitchen ipratropium (ATROVENT) 0.03 % nasal spray USE 2 SPRAYS IN EACH NOSTRIL EVERY 12 HOURS   . losartan (COZAAR) 50 MG tablet Take 50 mg by mouth daily.   . metFORMIN (GLUCOPHAGE-XR) 500 MG 24 hr tablet TAKE 3 TABLETS BY MOUTH WITH BREAKFAST 02/01/2020: Takes 2 tablets daily  . metoprolol succinate (TOPROL-XL) 25 MG 24 hr tablet TAKE 1 TABLET(25 MG) BY MOUTH DAILY   . ustekinumab (STELARA) 45 MG/0.5ML SOSY injection Inject 45 mg into the skin. 02/25/2018: Every 3 months  . [DISCONTINUED] clobetasol (TEMOVATE) 0.05 % external solution APPLY TO THE SKIN BID 09/15/2018: Uses on her scalp once/week  . [DISCONTINUED] losartan (  COZAAR) 25 MG tablet TAKE 1 TABLET(25 MG) BY MOUTH DAILY   . EPINEPHrine 0.3 mg/0.3 mL IJ SOAJ injection Inject 0.3 mLs (0.3 mg total) into the muscle as needed for anaphylaxis. (Patient not taking: Reported on 02/01/2020)   . loratadine (CLARITIN) 10 MG tablet Take 10 mg by mouth daily. 09/15/2018: Uses prn, only 1-2x/month  . simethicone (MYLICON) 80 MG chewable tablet Chew 80 mg by mouth as needed  for flatulence.   . [DISCONTINUED] losartan (COZAAR) 50 MG tablet TAKE 1 TABLET(50 MG) BY MOUTH DAILY    No facility-administered encounter medications on file as of 02/01/2020.     Allergies  Allergen Reactions  . Sesame Oil Anaphylaxis    Sesame seed/oil  . Ace Inhibitors Cough  . Latex Rash    ROS: no fever, chills, headaches, dizziness, chest pain, palpitations, shortness of breath or edema.  No URI symptoms.  No GI or GU complaints. No bleeding, bruising.  Moods are good Psoriasis--patch on back, asymptomatic. Joint pain is short-lived after Stelara injection, otherwise well controlled. Lost 7# in the last month   PHYSICAL EXAM:  BP 132/74   Pulse 60   Temp (!) 96.9 F (36.1 C) (Other (Comment))   Ht 5\' 3"  (1.6 m)   Wt 200 lb (90.7 kg)   BMI 35.43 kg/m   Wt Readings from Last 3 Encounters:  02/01/20 200 lb (90.7 kg)  06/02/19 197 lb 12.8 oz (89.7 kg)  09/15/18 189 lb 6.4 oz (85.9 kg)    Well appearing, pleasant female, in good spirits HEENT: conjunctiva and sclera are clear, EOMI, wearing mask Neck: No lymphadenopathy, thyromegaly or mass, no carotid bruit Heart: regular rate and rhythm Lungs: clear bilaterally Back: no spinal or CVA tenderness Abdomen: soft, nontender, no organomegaly or mass Extremities: no edema Skin: normal turgor, no visible rash (wearing dress, back not examined). Slight hyperpigmentation of lower shins R>L. Neuro: alert and oriented, normal gait Psych: normal mood, affect, hygiene and grooming   Lab Results  Component Value Date   HGBA1C 6.7 (A) 02/01/2020     ASSESSMENT/PLAN:  Controlled type 2 diabetes mellitus with complication, without long-term current use of insulin ([facility]) - A1c slightly higher. Cont metformin 1000mg  daily, and efforts with weight loss and proper diet - Plan: HgB A1c  Hypertension associated with diabetes ([place]) - well controlled  Hyperlipidemia associated with type 2 diabetes mellitus ([place]) - at goal per  last check, continue atorvastatin. recheck lipids in 05/2020  Psoriatic arthritis ([place]) - well controlled on stelara  Severe obesity (BMI 35.0-35.9 with comorbidity) ([place]) - counseled at length re: diet, intermittent fasting, weight loss, exercise, metabolism. Recommended MWM and she is interested - Plan: Amb Ref to Medical Weight Management  Vitamin D deficiency - Continue daily supplement. Consider recheck at next visit (will wait to review rheum labs) - Plan: metFORMIN (GLUCOPHAGE-XR) 500 MG 24 hr tablet   Has AWV scheduled for August Will check lipids then; in the interim, will get Dr. Melissa Noon records to see if other labs are needed.  Pt reports having labs done for Dr. Amil Amen, no records received.  Will have her sign ROR to get notes/labs.  [facility] program discussed in detail. She is very interested

## 2020-02-01 ENCOUNTER — Other Ambulatory Visit: Payer: Self-pay

## 2020-02-01 ENCOUNTER — Ambulatory Visit (INDEPENDENT_AMBULATORY_CARE_PROVIDER_SITE_OTHER): Payer: Medicare Other | Admitting: Family Medicine

## 2020-02-01 ENCOUNTER — Encounter: Payer: Self-pay | Admitting: Family Medicine

## 2020-02-01 VITALS — BP 132/74 | HR 60 | Temp 96.9°F | Ht 63.0 in | Wt 200.0 lb

## 2020-02-01 DIAGNOSIS — E1159 Type 2 diabetes mellitus with other circulatory complications: Secondary | ICD-10-CM | POA: Diagnosis not present

## 2020-02-01 DIAGNOSIS — E785 Hyperlipidemia, unspecified: Secondary | ICD-10-CM

## 2020-02-01 DIAGNOSIS — E1169 Type 2 diabetes mellitus with other specified complication: Secondary | ICD-10-CM | POA: Diagnosis not present

## 2020-02-01 DIAGNOSIS — Z6835 Body mass index (BMI) 35.0-35.9, adult: Secondary | ICD-10-CM

## 2020-02-01 DIAGNOSIS — E559 Vitamin D deficiency, unspecified: Secondary | ICD-10-CM | POA: Diagnosis not present

## 2020-02-01 DIAGNOSIS — L4050 Arthropathic psoriasis, unspecified: Secondary | ICD-10-CM | POA: Diagnosis not present

## 2020-02-01 DIAGNOSIS — E118 Type 2 diabetes mellitus with unspecified complications: Secondary | ICD-10-CM | POA: Diagnosis not present

## 2020-02-01 DIAGNOSIS — I10 Essential (primary) hypertension: Secondary | ICD-10-CM

## 2020-02-01 LAB — POCT GLYCOSYLATED HEMOGLOBIN (HGB A1C): Hemoglobin A1C: 6.7 % — AB (ref 4.0–5.6)

## 2020-02-01 MED ORDER — METFORMIN HCL ER 500 MG PO TB24: 1000.0000 mg | ORAL_TABLET | Freq: Every day | ORAL | 0 refills | Status: DC

## 2020-02-01 NOTE — Patient Instructions (Signed)
We put in a referral to the [facility] clinic through [facility] (Dr. Leafy Ro, Sheral Flow, etc; also referred to as "medical weight management") If you don't hear from them, feel free to contact their office directly.

## 2020-02-21 ENCOUNTER — Telehealth: Payer: Self-pay | Admitting: Family Medicine

## 2020-02-21 NOTE — Telephone Encounter (Signed)
Received requested medical records from [facility]

## 2020-02-24 ENCOUNTER — Encounter: Payer: Self-pay | Admitting: Family Medicine

## 2020-03-09 ENCOUNTER — Other Ambulatory Visit: Payer: Self-pay | Admitting: Family Medicine

## 2020-03-30 DIAGNOSIS — L4050 Arthropathic psoriasis, unspecified: Secondary | ICD-10-CM | POA: Diagnosis not present

## 2020-04-03 ENCOUNTER — Other Ambulatory Visit: Payer: Self-pay | Admitting: Family Medicine

## 2020-04-03 DIAGNOSIS — I10 Essential (primary) hypertension: Secondary | ICD-10-CM

## 2020-04-03 DIAGNOSIS — E559 Vitamin D deficiency, unspecified: Secondary | ICD-10-CM

## 2020-04-03 DIAGNOSIS — E7800 Pure hypercholesterolemia, unspecified: Secondary | ICD-10-CM

## 2020-04-06 DIAGNOSIS — Z6835 Body mass index (BMI) 35.0-35.9, adult: Secondary | ICD-10-CM | POA: Diagnosis not present

## 2020-04-06 DIAGNOSIS — E559 Vitamin D deficiency, unspecified: Secondary | ICD-10-CM | POA: Diagnosis not present

## 2020-04-06 DIAGNOSIS — M79605 Pain in left leg: Secondary | ICD-10-CM | POA: Diagnosis not present

## 2020-04-06 DIAGNOSIS — L401 Generalized pustular psoriasis: Secondary | ICD-10-CM | POA: Diagnosis not present

## 2020-04-06 DIAGNOSIS — M150 Primary generalized (osteo)arthritis: Secondary | ICD-10-CM | POA: Diagnosis not present

## 2020-04-06 DIAGNOSIS — R7989 Other specified abnormal findings of blood chemistry: Secondary | ICD-10-CM | POA: Diagnosis not present

## 2020-04-06 DIAGNOSIS — L4050 Arthropathic psoriasis, unspecified: Secondary | ICD-10-CM | POA: Diagnosis not present

## 2020-04-06 DIAGNOSIS — E669 Obesity, unspecified: Secondary | ICD-10-CM | POA: Diagnosis not present

## 2020-04-06 DIAGNOSIS — Z79899 Other long term (current) drug therapy: Secondary | ICD-10-CM | POA: Diagnosis not present

## 2020-04-23 ENCOUNTER — Other Ambulatory Visit: Payer: Self-pay | Admitting: Family Medicine

## 2020-05-09 ENCOUNTER — Other Ambulatory Visit: Payer: Self-pay

## 2020-05-09 ENCOUNTER — Ambulatory Visit (INDEPENDENT_AMBULATORY_CARE_PROVIDER_SITE_OTHER): Payer: Medicare Other | Admitting: Bariatrics

## 2020-05-09 ENCOUNTER — Encounter (INDEPENDENT_AMBULATORY_CARE_PROVIDER_SITE_OTHER): Payer: Self-pay | Admitting: Bariatrics

## 2020-05-09 VITALS — BP 159/71 | HR 72 | Temp 98.4°F | Ht 63.0 in | Wt 205.0 lb

## 2020-05-09 DIAGNOSIS — Z6836 Body mass index (BMI) 36.0-36.9, adult: Secondary | ICD-10-CM | POA: Diagnosis not present

## 2020-05-09 DIAGNOSIS — R0602 Shortness of breath: Secondary | ICD-10-CM

## 2020-05-09 DIAGNOSIS — E669 Obesity, unspecified: Secondary | ICD-10-CM

## 2020-05-09 DIAGNOSIS — E039 Hypothyroidism, unspecified: Secondary | ICD-10-CM | POA: Diagnosis not present

## 2020-05-09 DIAGNOSIS — Z0289 Encounter for other administrative examinations: Secondary | ICD-10-CM

## 2020-05-09 DIAGNOSIS — E1169 Type 2 diabetes mellitus with other specified complication: Secondary | ICD-10-CM | POA: Diagnosis not present

## 2020-05-09 DIAGNOSIS — E038 Other specified hypothyroidism: Secondary | ICD-10-CM

## 2020-05-09 DIAGNOSIS — E559 Vitamin D deficiency, unspecified: Secondary | ICD-10-CM

## 2020-05-09 DIAGNOSIS — R5383 Other fatigue: Secondary | ICD-10-CM | POA: Diagnosis not present

## 2020-05-09 DIAGNOSIS — E1159 Type 2 diabetes mellitus with other circulatory complications: Secondary | ICD-10-CM

## 2020-05-09 DIAGNOSIS — E785 Hyperlipidemia, unspecified: Secondary | ICD-10-CM

## 2020-05-09 DIAGNOSIS — I10 Essential (primary) hypertension: Secondary | ICD-10-CM | POA: Diagnosis not present

## 2020-05-09 DIAGNOSIS — Z1331 Encounter for screening for depression: Secondary | ICD-10-CM

## 2020-05-09 DIAGNOSIS — I152 Hypertension secondary to endocrine disorders: Secondary | ICD-10-CM

## 2020-05-09 NOTE — Progress Notes (Signed)
Dear Dr. Rita Matthews,   Thank you for referring April Matthews to our clinic. The following note includes my evaluation and treatment recommendations.  Chief Complaint:   OBESITY April Matthews (MR# [id number]) is a 74 y.o. female who presents for evaluation and treatment of obesity and related comorbidities. Current BMI is Body mass index is 36.31 kg/m.Marland Kitchen April Matthews has been struggling with her weight for many years and has been unsuccessful in either losing weight, maintaining weight loss, or reaching her healthy weight goal.  April Matthews is currently in the action stage of change and ready to dedicate time achieving and maintaining a healthier weight. April Matthews is interested in becoming our patient and working on intensive lifestyle modifications including (but not limited to) diet and exercise for weight loss.  April Matthews states that she likes to cook, but notes fatigue as an obstacle. She sometimes craves chocolate.  April Matthews's habits were reviewed today and are as follows: Her family eats meals together, she thinks her family will eat healthier with her, her desired weight loss is 65 lbs, she started gaining weight around the age of 72, her heaviest weight ever was 225 pounds, she sometimes craves chocolate, and she struggles with emotional eating.  Depression Screen April Matthews's Food and Mood (modified PHQ-9) score was 2.  Depression screen April Matthews 2/9 05/09/2020  Decreased Interest 0  Down, Depressed, Hopeless 0  PHQ - 2 Score 0  Altered sleeping 0  Tired, decreased energy 1  Change in appetite 1  Feeling bad or failure about yourself  0  Trouble concentrating 0  Moving slowly or fidgety/restless 0  Suicidal thoughts 0  PHQ-9 Score 2  Difficult doing work/chores Not difficult at all   Subjective:   Other fatigue. April Matthews denies daytime somnolence and denies waking up still tired. April Matthews generally gets 7-8.5 hours of sleep per night, and states that she has generally restful sleep. Snoring is present.  Apneic episodes are not present. Epworth Sleepiness Score is 3.  SOB (shortness of breath) on exertion. April Matthews notes increasing shortness of breath with certain activities and seems to be worsening over time with weight gain. She notes getting out of breath sooner with activity than she used to. This has not gotten worse recently. April Matthews denies shortness of breath at rest or orthopnea.  Diabetes mellitus type 2 in obese (April Matthews). April Matthews is taking metformin.   Lab Results  Component Value Date   HGBA1C 6.7 (A) 02/01/2020   HGBA1C 6.2 (A) 06/02/2019   HGBA1C 6.2 (A) 09/15/2018   Lab Results  Component Value Date   MICROALBUR 0.8 11/27/2016   LDLCALC 59 06/02/2019   CREATININE 1.16 (H) 06/02/2019   No results found for: INSULIN  Hyperlipidemia associated with type 2 diabetes mellitus (April Matthews). April Matthews is taking atorvastatin.  Lab Results  Component Value Date   CHOL 141 06/02/2019   HDL 56 06/02/2019   LDLCALC 59 06/02/2019   TRIG 128 06/02/2019   CHOLHDL 2.5 06/02/2019   Lab Results  Component Value Date   ALT 14 06/02/2019   AST 13 06/02/2019   ALKPHOS 88 06/02/2019   BILITOT 0.4 06/02/2019   The 10-year ASCVD risk score April Matthews., et al., 2013) is: 45.3%   Values used to calculate the score:     Age: 37 years     Sex: Female     Is Non-Hispanic African American: No     Diabetic: Yes     Tobacco smoker: No     Systolic Blood Pressure: 409  mmHg     Is BP treated: Yes     HDL Cholesterol: 56 mg/dL     Total Cholesterol: 141 mg/dL  Hypertension associated with diabetes ([name]). [name] is taking Losartan and Toprol XL. Blood pressure is elevated today. She is having back and hip pain.  BP Readings from Last 3 Encounters:  05/09/20 (!) 159/71  02/01/20 132/74  06/02/19 (!) 150/80   Lab Results  Component Value Date   CREATININE 1.16 (H) 06/02/2019   CREATININE 1.13 (H) 09/15/2018   CREATININE 1.05 (H) 02/25/2018   Vitamin D deficiency. April Matthews is taking OTC Vitamin D  supplementation.    Ref. Range 06/02/2019 15:30  Vitamin D, 25-Hydroxy Latest Ref Range: 30.0 - 100.0 ng/mL 32.5   Subclinical hypothyroidism. April Matthews is on no medications. She reports her energy level to be reasonable.   Lab Results  Component Value Date   TSH 4.050 09/15/2018   Depression screening. [name] had a negative depression screen with a PHQ-9 score of 2.  Assessment/Plan:   Other fatigue. [name] does feel that her weight is causing her energy to be lower than it should be. Fatigue may be related to obesity, depression or many other causes. Labs will be ordered, and in the meanwhile, [name] will focus on self care including making healthy food choices, increasing physical activity and focusing on stress reduction. EKG 12-Lead, Insulin, random, T3, T4, free, TSH  SOB (shortness of breath) on exertion. [name] does not feel that she gets out of breath more easily that she used to when she exercises. [name]'s shortness of breath appears to be obesity related and exercise induced. She has agreed to work on weight loss and gradually increase exercise to treat her exercise induced shortness of breath. Will continue to monitor closely.  Diabetes mellitus type 2 in obese ([place]). Good blood sugar control is important to decrease the likelihood of diabetic complications such as nephropathy, neuropathy, limb loss, blindness, coronary artery disease, and death. Intensive lifestyle modification including diet, exercise and weight loss are the first line of treatment for diabetes. [name] will continue her medication as directed.   Hyperlipidemia associated with type 2 diabetes mellitus ([name]). Cardiovascular risk and specific lipid/LDL goals reviewed.  We discussed several lifestyle modifications today and [name] will continue to work on diet, exercise and weight loss efforts. Orders and follow up as documented in patient record. She will continue her medication as directed. Lipid Panel With LDL/HDL Ratio  will be checked today.  Counseling Intensive lifestyle modifications are the first line treatment for this issue. . Dietary changes: Increase soluble fiber. Decrease simple carbohydrates. . Exercise changes: Moderate to vigorous-intensity aerobic activity 150 minutes per week if tolerated. . Lipid-lowering medications: see documented in medical record.   Hypertension associated with diabetes (April Matthews). April Matthews is working on healthy weight loss and exercise to improve blood pressure control. We will watch for signs of hypotension as she continues her lifestyle modifications. She will continue her medications as directed. Comprehensive metabolic panel will be checked today.  Vitamin D deficiency. Low Vitamin D level contributes to fatigue and are associated with obesity, breast, and colon cancer. She agrees to continue to take OTC Vitamin D supplementation as directed and VITAMIN D 25 Hydroxy (Vit-D Deficiency, Fractures) level will be checked today.  Subclinical hypothyroidism.  T3, T4, free, TSH will be checked today.  Depression screening. Marsheila's depression screen was negative.  Class 2 severe obesity with serious comorbidity and body mass index (BMI) of 36.0 to 36.9 in adult,  unspecified obesity type (April Matthews).  April Matthews is currently in the action stage of change and her goal is to continue with weight loss efforts. I recommend April Matthews begin the structured treatment plan as follows:  She has agreed to the Category 1 Plan.  She will work on meal planning and intentional eating.   We reviewed with the patient labs from 06/02/2019 including CMP, lipids, Vitamin D, CBC, and A1c.  Exercise goals: April Matthews is swimming on a regular basis and doing aerobics.  Behavioral modification strategies: increasing lean protein intake, decreasing simple carbohydrates, increasing vegetables, increasing water intake, decreasing eating out, no skipping meals, meal planning and cooking strategies, keeping healthy foods  in the home and planning for success.  She was informed of the importance of frequent follow-up visits to maximize her success with intensive lifestyle modifications for her multiple health conditions. She was informed we would discuss her lab results at her next visit unless there is a critical issue that needs to be addressed sooner. April Matthews agreed to keep her next visit at the agreed upon time to discuss these results.  Objective:   Blood pressure (!) 159/71, pulse 72, temperature 98.4 F (36.9 C), height 5\' 3"  (1.6 m), weight 205 lb (93 kg), SpO2 96 %. Body mass index is 36.31 kg/m.  EKG: Normal sinus rhythm, rate 73 BPM. Within normal limits.  Indirect Calorimeter completed today shows a VO2 of 214 and a REE of 1493.  Her calculated basal metabolic rate is 7035 thus her basal metabolic rate is worse than expected.  General: Cooperative, alert, well developed, in no acute distress. HEENT: Conjunctivae and lids unremarkable. Cardiovascular: Regular rhythm.  Lungs: Normal work of breathing. Neurologic: No focal deficits.   Lab Results  Component Value Date   CREATININE 1.16 (H) 06/02/2019   BUN 24 06/02/2019   NA 142 06/02/2019   K 4.4 06/02/2019   CL 104 06/02/2019   CO2 23 06/02/2019   Lab Results  Component Value Date   ALT 14 06/02/2019   AST 13 06/02/2019   ALKPHOS 88 06/02/2019   BILITOT 0.4 06/02/2019   Lab Results  Component Value Date   HGBA1C 6.7 (A) 02/01/2020   HGBA1C 6.2 (A) 06/02/2019   HGBA1C 6.2 (A) 09/15/2018   HGBA1C 6.5 02/25/2018   HGBA1C 7.1 07/13/2017   No results found for: INSULIN Lab Results  Component Value Date   TSH 4.050 09/15/2018   Lab Results  Component Value Date   CHOL 141 06/02/2019   HDL 56 06/02/2019   LDLCALC 59 06/02/2019   TRIG 128 06/02/2019   CHOLHDL 2.5 06/02/2019   Lab Results  Component Value Date   WBC 8.0 06/02/2019   HGB 13.6 06/02/2019   HCT 40.6 06/02/2019   MCV 94 06/02/2019   PLT 346 06/02/2019   No  results found for: IRON, TIBC, FERRITIN  Obesity Behavioral Intervention Visit Documentation for Insurance:   Approximately 15 minutes were spent on the discussion below.  ASK: We discussed the diagnosis of obesity with April Matthews today and April Matthews agreed to give [place] permission to discuss obesity behavioral modification therapy today.  ASSESS: April Matthews has the diagnosis of obesity and her BMI today is 36.3. Anneth is in the action stage of change.   ADVISE: April Matthews was educated on the multiple health risks of obesity as well as the benefit of weight loss to improve her health. She was advised of the need for long term treatment and the importance of lifestyle modifications to improve her current  health and to decrease her risk of future health problems.  AGREE: Multiple dietary modification options and treatment options were discussed and Farrell agreed to follow the recommendations documented in the above note.  ARRANGE: Tifanny was educated on the importance of frequent visits to treat obesity as outlined per CMS and USPSTF guidelines and agreed to schedule her next follow up appointment today.  Attestation Statements:   Reviewed by clinician on day of visit: allergies, medications, problem list, medical history, surgical history, family history, social history, and previous encounter notes.  [name], am acting as Location manager for [employer], DO   I have reviewed the above documentation for accuracy and completeness, and I agree with the above. Jearld Lesch, DO

## 2020-05-10 LAB — COMPREHENSIVE METABOLIC PANEL
ALT: 14 IU/L (ref 0–32)
AST: 14 IU/L (ref 0–40)
Albumin/Globulin Ratio: 1.6 (ref 1.2–2.2)
Albumin: 4.6 g/dL (ref 3.7–4.7)
Alkaline Phosphatase: 89 IU/L (ref 48–121)
BUN/Creatinine Ratio: 21 (ref 12–28)
BUN: 23 mg/dL (ref 8–27)
Bilirubin Total: 0.4 mg/dL (ref 0.0–1.2)
CO2: 24 mmol/L (ref 20–29)
Calcium: 10.1 mg/dL (ref 8.7–10.3)
Chloride: 103 mmol/L (ref 96–106)
Creatinine, Ser: 1.07 mg/dL — ABNORMAL HIGH (ref 0.57–1.00)
GFR calc Af Amer: 59 mL/min/{1.73_m2} — ABNORMAL LOW (ref >59)
GFR calc non Af Amer: 51 mL/min/{1.73_m2} — ABNORMAL LOW (ref >59)
Globulin, Total: 2.8 g/dL (ref 1.5–4.5)
Glucose: 134 mg/dL — ABNORMAL HIGH (ref 65–99)
Potassium: 4.9 mmol/L (ref 3.5–5.2)
Sodium: 141 mmol/L (ref 134–144)
Total Protein: 7.4 g/dL (ref 6.0–8.5)

## 2020-05-10 LAB — VITAMIN D 25 HYDROXY (VIT D DEFICIENCY, FRACTURES): Vit D, 25-Hydroxy: 32.7 ng/mL (ref 30.0–100.0)

## 2020-05-10 LAB — LIPID PANEL WITH LDL/HDL RATIO
Cholesterol, Total: 130 mg/dL (ref 100–199)
HDL: 47 mg/dL (ref >39)
LDL Chol Calc (NIH): 57 mg/dL (ref 0–99)
LDL/HDL Ratio: 1.2 ratio (ref 0.0–3.2)
Triglycerides: 156 mg/dL — ABNORMAL HIGH (ref 0–149)
VLDL Cholesterol Cal: 26 mg/dL (ref 5–40)

## 2020-05-10 LAB — T3: T3, Total: 154 ng/dL (ref 71–180)

## 2020-05-10 LAB — T4, FREE: Free T4: 1.13 ng/dL (ref 0.82–1.77)

## 2020-05-10 LAB — TSH: TSH: 5.55 u[IU]/mL — ABNORMAL HIGH (ref 0.450–4.500)

## 2020-05-10 LAB — INSULIN, RANDOM: INSULIN: 25.5 u[IU]/mL — ABNORMAL HIGH (ref 2.6–24.9)

## 2020-05-23 ENCOUNTER — Other Ambulatory Visit: Payer: Self-pay

## 2020-05-23 ENCOUNTER — Encounter (INDEPENDENT_AMBULATORY_CARE_PROVIDER_SITE_OTHER): Payer: Self-pay | Admitting: Bariatrics

## 2020-05-23 ENCOUNTER — Ambulatory Visit (INDEPENDENT_AMBULATORY_CARE_PROVIDER_SITE_OTHER): Payer: Medicare Other | Admitting: Bariatrics

## 2020-05-23 VITALS — BP 134/80 | HR 74 | Temp 98.2°F | Ht 63.0 in | Wt 201.0 lb

## 2020-05-23 DIAGNOSIS — R7989 Other specified abnormal findings of blood chemistry: Secondary | ICD-10-CM

## 2020-05-23 DIAGNOSIS — E669 Obesity, unspecified: Secondary | ICD-10-CM

## 2020-05-23 DIAGNOSIS — E1169 Type 2 diabetes mellitus with other specified complication: Secondary | ICD-10-CM | POA: Diagnosis not present

## 2020-05-23 DIAGNOSIS — E559 Vitamin D deficiency, unspecified: Secondary | ICD-10-CM

## 2020-05-23 DIAGNOSIS — Z6835 Body mass index (BMI) 35.0-35.9, adult: Secondary | ICD-10-CM | POA: Diagnosis not present

## 2020-05-23 MED ORDER — VITAMIN D (ERGOCALCIFEROL) 1.25 MG (50000 UNIT) PO CAPS: 50000.0000 [IU] | ORAL_CAPSULE | ORAL | 0 refills | Status: DC

## 2020-05-24 ENCOUNTER — Encounter (INDEPENDENT_AMBULATORY_CARE_PROVIDER_SITE_OTHER): Payer: Self-pay | Admitting: Bariatrics

## 2020-05-24 NOTE — Progress Notes (Signed)
Chief Complaint:   Pangburn is here to discuss her progress with her obesity treatment plan along with follow-up of her obesity related diagnoses. Antonella is on the Category 1 Plan and states she is following her eating plan approximately 30% of the time. Ryah states she is swimming 60 minutes 5 days per week and walking 6,000-8,000 steps 5 times per week.  Today's visit was #: 2 Starting weight: 205 lbs Starting date: 05/09/2020 Today's weight: 201 lbs Today's date: 05/23/2020 Total lbs lost to date: 4 Total lbs lost since last in-office visit: 4  Interim History: Treena is down 4 lbs. She states that things have been going well. She does struggle slightly with getting in enough water.  Subjective:   Vitamin D deficiency. Demonica is taking Vitamin D3 1,000 IU.   Ref. Range 05/09/2020 14:38  Vitamin D, 25-Hydroxy Latest Ref Range: 30.0 - 100.0 ng/mL 32.7   Diabetes mellitus type 2 in obese ([place]). Amillya is taking metformin.   Lab Results  Component Value Date   HGBA1C 6.7 (A) 02/01/2020   HGBA1C 6.2 (A) 06/02/2019   HGBA1C 6.2 (A) 09/15/2018   Lab Results  Component Value Date   MICROALBUR 0.8 11/27/2016   LDLCALC 57 05/09/2020   CREATININE 1.07 (H) 05/09/2020   Lab Results  Component Value Date   INSULIN 25.5 (H) 05/09/2020   Elevated TSH. TSH was elevated at 5.550 on 05/09/2020.  Assessment/Plan:   Vitamin D deficiency. Low Vitamin D level contributes to fatigue and are associated with obesity, breast, and colon cancer. She was given a prescription for Vitamin D, Ergocalciferol, (DRISDOL) 1.25 MG (50000 UNIT) CAPS capsule every week #4 with 0 refills and will follow-up for routine testing of Vitamin D, at least 2-3 times per year to avoid over-replacement.   Diabetes mellitus type 2 in obese ([place]). Good blood sugar control is important to decrease the likelihood of diabetic complications such as nephropathy, neuropathy, limb loss, blindness, coronary  artery disease, and death. Intensive lifestyle modification including diet, exercise and weight loss are the first line of treatment for diabetes. Anagha will continue metformin as directed.   Elevated TSH. Will watch over time.  Class 2 severe obesity with serious comorbidity and body mass index (BMI) of 35.0 to 35.9 in adult, unspecified obesity type ([place]).  Yolando is currently in the action stage of change. As such, her goal is to continue with weight loss efforts. She has agreed to the Category 1 Plan.   She will work on meal planning, intentional eating, and will read labels.  We reviewed with the patient labs from 05/09/2020 including CMP, lipids, Vitamin D, glucose, insulin, and thyroid panel.  Exercise goals: Mckenzye will continue walking 6,000-8,000 steps daily and swimming.  Behavioral modification strategies: increasing lean protein intake, decreasing simple carbohydrates, increasing vegetables, increasing water intake, decreasing eating out, no skipping meals, meal planning and cooking strategies, keeping healthy foods in the home and planning for success.  Jerilyn has agreed to follow-up with our clinic in 2 weeks. She was informed of the importance of frequent follow-up visits to maximize her success with intensive lifestyle modifications for her multiple health conditions.   Objective:   Blood pressure (!) 134/80, pulse 74, temperature 98.2 F (36.8 C), height 5\' 3"  (1.6 m), weight 201 lb (91.2 kg), SpO2 94 %. Body mass index is 35.61 kg/m.  General: Cooperative, alert, well developed, in no acute distress. HEENT: Conjunctivae and lids unremarkable. Cardiovascular: Regular rhythm.  Lungs:  Normal work of breathing. Neurologic: No focal deficits.   Lab Results  Component Value Date   CREATININE 1.07 (H) 05/09/2020   BUN 23 05/09/2020   NA 141 05/09/2020   K 4.9 05/09/2020   CL 103 05/09/2020   CO2 24 05/09/2020   Lab Results  Component Value Date   ALT 14  05/09/2020   AST 14 05/09/2020   ALKPHOS 89 05/09/2020   BILITOT 0.4 05/09/2020   Lab Results  Component Value Date   HGBA1C 6.7 (A) 02/01/2020   HGBA1C 6.2 (A) 06/02/2019   HGBA1C 6.2 (A) 09/15/2018   HGBA1C 6.5 02/25/2018   HGBA1C 7.1 07/13/2017   Lab Results  Component Value Date   INSULIN 25.5 (H) 05/09/2020   Lab Results  Component Value Date   TSH 5.550 (H) 05/09/2020   Lab Results  Component Value Date   CHOL 130 05/09/2020   HDL 47 05/09/2020   LDLCALC 57 05/09/2020   TRIG 156 (H) 05/09/2020   CHOLHDL 2.5 06/02/2019   Lab Results  Component Value Date   WBC 8.0 06/02/2019   HGB 13.6 06/02/2019   HCT 40.6 06/02/2019   MCV 94 06/02/2019   PLT 346 06/02/2019   No results found for: IRON, TIBC, FERRITIN  Obesity Behavioral Intervention Documentation for Insurance:   Approximately 15 minutes were spent on the discussion below.  ASK: We discussed the diagnosis of obesity with Kalan today and Hollyann agreed to give [place] permission to discuss obesity behavioral modification therapy today.  ASSESS: [name] has the diagnosis of obesity and her BMI today is 35.6. Jennice is in the action stage of change.   ADVISE: Cornie was educated on the multiple health risks of obesity as well as the benefit of weight loss to improve her health. She was advised of the need for long term treatment and the importance of lifestyle modifications to improve her current health and to decrease her risk of future health problems.  AGREE: Multiple dietary modification options and treatment options were discussed and Filomena agreed to follow the recommendations documented in the above note.  ARRANGE: Elen was educated on the importance of frequent visits to treat obesity as outlined per CMS and USPSTF guidelines and agreed to schedule her next follow up appointment today.  Attestation Statements:   Reviewed by clinician on day of visit: allergies, medications, problem list, medical  history, surgical history, family history, social history, and previous encounter notes.  [name], am acting as Location manager for [employer], DO   I have reviewed the above documentation for accuracy and completeness, and I agree with the above. Jearld Lesch, DO

## 2020-06-07 ENCOUNTER — Ambulatory Visit (INDEPENDENT_AMBULATORY_CARE_PROVIDER_SITE_OTHER): Payer: Medicare Other | Admitting: Bariatrics

## 2020-06-07 ENCOUNTER — Encounter (INDEPENDENT_AMBULATORY_CARE_PROVIDER_SITE_OTHER): Payer: Self-pay | Admitting: Bariatrics

## 2020-06-07 ENCOUNTER — Other Ambulatory Visit: Payer: Self-pay

## 2020-06-07 VITALS — BP 140/83 | HR 81 | Temp 98.4°F | Ht 63.0 in | Wt 206.0 lb

## 2020-06-07 DIAGNOSIS — Z6836 Body mass index (BMI) 36.0-36.9, adult: Secondary | ICD-10-CM

## 2020-06-07 DIAGNOSIS — E1159 Type 2 diabetes mellitus with other circulatory complications: Secondary | ICD-10-CM | POA: Diagnosis not present

## 2020-06-07 DIAGNOSIS — E1169 Type 2 diabetes mellitus with other specified complication: Secondary | ICD-10-CM | POA: Diagnosis not present

## 2020-06-07 DIAGNOSIS — I10 Essential (primary) hypertension: Secondary | ICD-10-CM | POA: Diagnosis not present

## 2020-06-07 DIAGNOSIS — E669 Obesity, unspecified: Secondary | ICD-10-CM

## 2020-06-08 ENCOUNTER — Other Ambulatory Visit: Payer: Self-pay | Admitting: Internal Medicine

## 2020-06-08 ENCOUNTER — Other Ambulatory Visit: Payer: Self-pay | Admitting: Family Medicine

## 2020-06-08 MED ORDER — LOSARTAN POTASSIUM 50 MG PO TABS: ORAL_TABLET | ORAL | 0 refills | Status: DC

## 2020-06-08 NOTE — Telephone Encounter (Signed)
Pt does not have enough until appt Monday so she asked for a90 day supply of this

## 2020-06-08 NOTE — Telephone Encounter (Signed)
Left message for pt to call me back 

## 2020-06-09 ENCOUNTER — Other Ambulatory Visit: Payer: Self-pay | Admitting: Family Medicine

## 2020-06-09 NOTE — Progress Notes (Signed)
Chief Complaint  Patient presents with  . Medicare Wellness    fasting AWV with pelvic. No new concerns.     April Matthews is a 74 y.o. female who presents for annual wellness visit and follow-up on chronic medical conditions.    Diabetes: She doesn't check blood sugars.  She is compliant with taking 1000mg  of metformin with dinner.  Dose was not changed by Dr. Owens Shark, though recent insulin level was elevated. Denies any numbness or tingling. Lastdiabeticeye exam was 01/25/2020.  She checks feet regularly, no concerns. Denies polydipsia, polyuria Lab Results  Component Value Date   HGBA1C 6.7 (A) 02/01/2020   Hypertension:She is compliant with taking losartan50mg  and toprol XL 25mg  daily. She hasn't been checking BP's at home recently, just at [facility] clinic.   (monitor verified as accurate in 12/2016) She denies any headaches, dizziness, swelling, chest pain.  BP Readings from Last 3 Encounters:  06/07/20 140/83  05/23/20 (!) 134/80  05/09/20 (!) 159/71   Obesity:She had been following the Diabetes Code diet, intermittent fasting, but now is under the care of [facility].  First visit was a month ago. Her family came to visit x 2 weeks, and she cooked a lot and ate a lot.  She is now motivated to do the program, and is ready to get more serious now they are gone, she is committed.  Hyperlipidemia: Tolerating atorvastatin without side effects. Trying to follow lowfat, low cholesterol diet. She has taken fish oil in the past, not currently. Lipids were at goal on last check, recently rechecked by [facility] clinic. Lab Results  Component Value Date   CHOL 130 05/09/2020   HDL 47 05/09/2020   LDLCALC 57 05/09/2020   TRIG 156 (H) 05/09/2020   CHOLHDL 2.5 06/02/2019   She has had some borderline thyroid tests in the past, TSH >4 for the past few years.  Recheck by [facility] clinic was a little higher, but had normal T3 and Free T4 values..  She denies changes in  hair/skin/bowels or energy. Lab Results  Component Value Date   TSH 5.550 (H) 05/09/2020   T3TOTAL 154 05/09/2020   Psoriatic arthritis:Under the care ofDr. Amil Amen.Doing well on her current regimen (Stelara).Last injection was in June, next due in September.  She has been having some more pain over the last month, taking some Advil (pain in L arm, where her COVID injection was, then moved to her waist, and now is in her hips and upper legs--acute sharp pains, short-lived). She takes 1 Advil, sometimes needs 2/day.  Hurts sometimes when she first starts to swim, then it works itself out.   The psoriatic patch on her back had cleared up, until she had a biopsy by Dr. Martinique on her arm, then it recurred and hasn't gone away since. She sees Dr. Martinique regularly.  Vitamin D deficiency:  H/o mild deficiency in the past (level of 27 in the past); most recent level 32.7.  She was prescribed Vitamin D by [facility] clinic, but she hasn't picked up the prescription yet.    Immunization History  Administered Date(s) Administered  . Influenza Split 09/08/2012, 08/10/2014, 08/21/2015  . Influenza, High Dose Seasonal PF 07/10/2016, 07/13/2017, 07/20/2018  . PFIZER SARS-COV-2 Vaccination 11/17/2019, 12/13/2019  . PPD Test 09/08/2012  . Pneumococcal Conjugate-13 04/20/2014  . Pneumococcal Polysaccharide-23 04/23/2015  . Tdap 06/19/2011   Last Pap smear: 05/2019 normal, no high risk HPV detected Last mammogram:10/2017  Last colonoscopy: 10/2015milddiverticulosis; Dr. Olevia Perches; repeat 10  years Last DEXA:10/2017 T-1.7 L femoral neck Dentist: goestwiceyearly Ophtho: goes yearly; also sees retinal specialist Exercise:Walks 30 minutes in her home (gets 6-8K steps daily).  Swims an hour daily (swimming and aerobic exercise in the pool), about 5x/week. Hasn't been using weights recently.  Other doctors caring for patient include: Dr. Amil Amen (rheum) Dr. Amy Martinique (derm) Dr. Kathrin Penner (ophtho) Dr.  Oval Linsey (retinal specialist/oncologistat [place] ophtho)  [place] in past, but she left. Dr.Cowan(dentist) Dr. Olevia Perches (retired--GI) Dr. Owens Shark ([facility])   Depression screen: Negative Functional statusscreen: notable for issues with hearing recently (though never has an issue with the children at school), and some word recall recently, the word usually comes to her. Fall screen: negative. Mini-Cog screen: normal (score of 5)  End of Life Discussion: Patient hasa medical power of attorney (her daughter)and Living Will.   PMH, PSH, SH and FH were reviewed and updated  Outpatient Encounter Medications as of 06/11/2020  Medication Sig Note  . atorvastatin (LIPITOR) 20 MG tablet TAKE 1 TABLET(20 MG) BY MOUTH DAILY   . Cholecalciferol (VITAMIN D3) 25 MCG (1000 UT) CAPS Take 1 capsule by mouth daily.   Marland Kitchen ibuprofen (ADVIL) 200 MG tablet Take 200 mg by mouth every 6 (six) hours as needed.  06/11/2020: 1 most mornings recently, occasionally one in the afternoon  . ipratropium (ATROVENT) 0.03 % nasal spray USE 2 SPRAYS IN EACH NOSTRIL EVERY 12 HOURS   . losartan (COZAAR) 50 MG tablet TAKE 1 TABLET(50 MG) BY MOUTH DAILY   . metFORMIN (GLUCOPHAGE-XR) 500 MG 24 hr tablet TAKE 3 TABLETS BY MOUTH WITH BREAKFAST 06/11/2020: 2 qhs  . metoprolol succinate (TOPROL-XL) 25 MG 24 hr tablet TAKE 1 TABLET(25 MG) BY MOUTH DAILY   . simethicone (MYLICON) 416 MG chewable tablet Chew 125 mg by mouth every 6 (six) hours as needed for flatulence.   . ustekinumab (STELARA) 45 MG/0.5ML SOSY injection Inject 45 mg into the skin. 02/25/2018: Every 3 months  . Vitamin D, Ergocalciferol, (DRISDOL) 1.25 MG (50000 UNIT) CAPS capsule Take 1 capsule (50,000 Units total) by mouth every 7 (seven) days. (Patient not taking: Reported on 06/11/2020) 06/11/2020: Hasn't picked up rx yet  . [DISCONTINUED] losartan (COZAAR) 25 MG tablet Take 25 mg by mouth daily.    No facility-administered encounter medications on file as of 06/11/2020.    Allergies  Allergen Reactions  . Sesame Oil Anaphylaxis    Sesame seed/oil  . Ace Inhibitors Cough  . Bee Venom   . Latex Rash    ROS: The patient denies anorexia, fever, headaches, vision changes, hearing changes, ear pain, sore throat, breast concerns, chest pain, palpitations, dizziness, syncope, dyspnea on exertion, cough, swelling, nausea, vomiting, diarrhea, constipation, abdominal pain, melena, hematochezia, hematuria, incontinence, dysuria, vaginal bleeding, discharge, odor or itch, genital lesions, numbness, tingling, weakness, tremor, suspicious skin lesions, depression, anxiety, abnormal bleeding/bruising, or enlarged lymph nodes. Joint pains per HPI, currently in hips. Psoriasis on back Occasional heartburn Occasional trouble recalling a word, but it comes back to her. +snoring per husband.  Wakes up feeling refreshed.  PHYSICAL EXAM:  BP (!) 150/80   Pulse 68   Ht 5\' 3"  (1.6 m)   Wt 207 lb (93.9 kg)   BMI 36.67 kg/m   136/80 on repeat by MD  Wt Readings from Last 3 Encounters:  06/11/20 207 lb (93.9 kg)  06/07/20 206 lb (93.4 kg)  05/23/20 201 lb (91.2 kg)   Wt 197# 12.8 oz 05/2019   General Appearance:   Alert, cooperative, no  distress, appears stated age  Head:   Normocephalic, without obvious abnormality, atraumatic  Eyes:   PERRL, conjunctiva/corneas clear, EOM's intact, fundi benign  Ears:   Normal TM's and external ear canals  Nose:  Not examined (wearing mask due to COVID-19 pandemic)  Throat:  Not examined (wearing mask due to COVID-19 pandemic)  Neck:  Supple, no lymphadenopathy; thyroid: noenlargement/ tenderness/nodules; no JVD.No bruit appreciable   Back:  Spine nontender, no curvature, ROM normal, no CVA tenderness  Lungs:   Clear to auscultation bilaterally without wheezes, rales or ronchi; respirations unlabored  Chest Wall:   No deformity or tenderness  Heart:   Regular rate and rhythm, S1 and S2 normal,  no rub or gallop, 2/6 SEM  Breast Exam:   No tenderness, masses, or nipple discharge or inversion (slightly inverted on the left, unchanged per pt). No axillary lymphadenopathy  Abdomen:   Soft, non-tender, nondistended, normoactive bowel sounds,   no masses, no hepatosplenomegaly. Obese. WHSS.  Genitalia:   Normal external genitalia without lesions, +atrophic changes. BUS and vagina normal; no cervical motion tenderness. No abnormal vaginal discharge. Uterus and adnexa not enlarged, but exam is somewhat limited by her body habitus; adnexa nontender, no masses. Pap not performed.   Rectal:   Normal tone, no masses or tenderness; guaiac negative stool  Extremities:  No clubbing, cyanosis or edema  Pulses:  2+ and symmetric all extremities  Skin:  Skin turgor normal, no suspicious lesions. MIld hyperpigmentation of anterior lower shinsbilaterally. Extensive psoriatic plaques (pink in color) along lower back/upper buttocks.  Lymph nodes:  Cervical, supraclavicular, and axillary nodes normal  Neurologic:  CNII-XII intact, normal strength, sensation and gait; reflexes 2+ and symmetric throughout  Psych: Normal mood, affect, hygiene and grooming.       Normal diabetic foot exam.  Lab Results  Component Value Date   HGBA1C 6.8 (A) 06/11/2020    ASSESSMENT/PLAN:  Medicare annual wellness visit, subsequent  Class 2 severe obesity with serious comorbidity and body mass index (BMI) of 36.0 to 36.9 in adult, unspecified obesity type ([place]) - Just now committed to working with [facility] on weight loss (now that family has left)  Controlled type 2 diabetes mellitus with complication, without long-term current use of insulin ([facility]) - A1c increased, and insulin level was elevated.  Will increase the metfformin to 1500mg  daily - Plan: HgB A1c  Hypertension associated with type 2 diabetes mellitus ([facility]) - BP high initially, borderline on recheck. To  monitor more regularly at home.  Discussed low Na diet, weight loss, exercise  Hyperlipidemia associated with type 2 diabetes mellitus ([place])  Subclinical hypothyroidism - remains asymptomatic; continue to monitor  Psoriatic arthritis ([place]) - under the care of Dr. Amil Amen. On Stelara. Discussed potential for COVID booster rec  To ask husband re: apnea (vs just snoring). Asked to get [place] copies of her living will and healthcare power of attorney. MOST reviewed--full code, full care.  Increase metformin to 3/day (1500), and to separate if can't tolerate taking all together.  To use tylenol in place of advil for pain; risks/SE reviewed  Discussed monthly self breast exams and yearly mammograms (past due). Recommended at least 30 minutes of aerobic activity at least 5 days/week, weight-bearing exercise 2x/week; proper sunscreen use reviewed; healthy diet, including goals of calcium and vitamin D intake and alcohol recommendations (less than or equal to 1 drink/day) reviewed; regular seatbelt use; changing batteries in smoke detectors. Immunization recommendations discussed-- continueyearly high dose flu shots. Shingrix recommended, risks/side effects  reviewed, to get from pharmacy.Discussed new recommendation for COVID booster due to her immunosuppressant drugs (she will check with Dr. Amil Amen).  Colonoscopy is UTD.  MOST form reviewed/signed. Full Code, Full Care   Medicare Attestation I have personally reviewed: The patient's medical and social history Their use of alcohol, tobacco or illicit drugs Their current medications and supplements The patient's functional ability including ADLs,fall risks, home safety risks, cognitive, and hearing and visual impairment Diet and physical activities Evidence for depression or mood disorders  The patient's weight, height, BMI have been recorded in the chart.  I have made referrals, counseling, and provided education to the patient based on  review of the above and I have provided the patient with a written personalized care plan for preventive services.

## 2020-06-09 NOTE — Patient Instructions (Addendum)
HEALTH MAINTENANCE RECOMMENDATIONS:  It is recommended that you get at least 30 minutes of aerobic exercise at least 5 days/week (for weight loss, you may need as much as 60-90 minutes). This can be any activity that gets your heart rate up. This can be divided in 10-15 minute intervals if needed, but try and build up your endurance at least once a week.  Weight bearing exercise is also recommended twice weekly.  Eat a healthy diet with lots of vegetables, fruits and fiber.  "Colorful" foods have a lot of vitamins (ie green vegetables, tomatoes, red peppers, etc).  Limit sweet tea, regular sodas and alcoholic beverages, all of which has a lot of calories and sugar.  Up to 1 alcoholic drink daily may be beneficial for women (unless trying to lose weight, watch sugars).  Drink a lot of water.  Calcium recommendations are 1200-1500 mg daily (1500 mg for postmenopausal women or women without ovaries), and vitamin D 1000 IU daily.  This should be obtained from diet and/or supplements (vitamins), and calcium should not be taken all at once, but in divided doses.  Monthly self breast exams and yearly mammograms for women over the age of 42 is recommended.  Sunscreen of at least SPF 30 should be used on all sun-exposed parts of the skin when outside between the hours of 10 am and 4 pm (not just when at beach or pool, but even with exercise, golf, tennis, and yard work!)  Use a sunscreen that says "broad spectrum" so it covers both UVA and UVB rays, and make sure to reapply every 1-2 hours.  Remember to change the batteries in your smoke detectors when changing your clock times in the spring and fall.  Use your seat belt every time you are in a car, and please drive safely and not be distracted with cell phones and texting while driving.    April Matthews , Thank you for taking time to come for your Medicare Wellness Visit. I appreciate your ongoing commitment to your health goals. Please review the following  plan we discussed and let me know if I can assist you in the future.    This is a list of the screening recommended for you and due dates:  Health Maintenance  Topic Date Due  . Mammogram  11/19/2019  . Flu Shot  05/27/2020  . Complete foot exam   06/01/2020  . Hemoglobin A1C  08/02/2020  . Eye exam for diabetics  01/24/2021  . Tetanus Vaccine  06/18/2021  . Colon Cancer Screening  08/25/2024  . DEXA scan (bone density measurement)  Completed  . COVID-19 Vaccine  Completed  .  Hepatitis C: One time screening is recommended by Center for Disease Control  (CDC) for  adults born from [birth year] through 1965.   Completed  . Pneumonia vaccines  Completed   [facility] and schedule your mammogram.  You are past due. We did you diabetic foot exam today. Please continue to get yearly, high dose, flu shots (ignore date above, not yet available).  I recommend getting the new shingles vaccine (Shingrix). You will need to get this at the pharmacy, as it is covered by Medicare Part D.  It is a series of 2 injections, spaced 2 months apart.  Stop the over-the-counter vitamin D when you start the prescription.  I believe you would qualify for a booster of the COVID vaccine. Consider this (and feel free to discuss with Dr. Amil Amen for his opinion).  Start using your weights with walking at least 2-3 times/week.  Change your metformin to take with a meal (breakfast or dinner). I recommend that you increase the dose to 3 per day.  If you can't tolerate taking all 3 at once, you can separate them (take 1 with breakfast, and 2 with dinner, or vice versa).  If you have loose stools, consider adding metamucil once daily.  Use tylenol in place of advil for pain--it is safer for your stomach and kidneys, if it works.  Please get [place] copies of your living will and healthcare power of attorney at your convenience.  Check with your husband and let us know if he hears you having pauses in your  breathing/snoring at night.  Then we would want to do a sleep test to evaluate for sleep apnea.

## 2020-06-11 ENCOUNTER — Ambulatory Visit: Payer: Medicare Other | Admitting: Family Medicine

## 2020-06-11 ENCOUNTER — Other Ambulatory Visit: Payer: Self-pay

## 2020-06-11 ENCOUNTER — Ambulatory Visit (INDEPENDENT_AMBULATORY_CARE_PROVIDER_SITE_OTHER): Payer: Medicare Other | Admitting: Family Medicine

## 2020-06-11 ENCOUNTER — Encounter: Payer: Self-pay | Admitting: Family Medicine

## 2020-06-11 VITALS — BP 136/80 | HR 68 | Ht 63.0 in | Wt 207.0 lb

## 2020-06-11 DIAGNOSIS — Z6836 Body mass index (BMI) 36.0-36.9, adult: Secondary | ICD-10-CM

## 2020-06-11 DIAGNOSIS — L4050 Arthropathic psoriasis, unspecified: Secondary | ICD-10-CM

## 2020-06-11 DIAGNOSIS — E118 Type 2 diabetes mellitus with unspecified complications: Secondary | ICD-10-CM | POA: Diagnosis not present

## 2020-06-11 DIAGNOSIS — I10 Essential (primary) hypertension: Secondary | ICD-10-CM

## 2020-06-11 DIAGNOSIS — E785 Hyperlipidemia, unspecified: Secondary | ICD-10-CM

## 2020-06-11 DIAGNOSIS — E038 Other specified hypothyroidism: Secondary | ICD-10-CM

## 2020-06-11 DIAGNOSIS — Z0000 Encounter for general adult medical examination without abnormal findings: Secondary | ICD-10-CM | POA: Diagnosis not present

## 2020-06-11 DIAGNOSIS — E1159 Type 2 diabetes mellitus with other circulatory complications: Secondary | ICD-10-CM | POA: Diagnosis not present

## 2020-06-11 DIAGNOSIS — E1169 Type 2 diabetes mellitus with other specified complication: Secondary | ICD-10-CM

## 2020-06-11 DIAGNOSIS — E039 Hypothyroidism, unspecified: Secondary | ICD-10-CM

## 2020-06-11 LAB — POCT GLYCOSYLATED HEMOGLOBIN (HGB A1C): Hemoglobin A1C: 6.8 % — AB (ref 4.0–5.6)

## 2020-06-12 ENCOUNTER — Encounter (INDEPENDENT_AMBULATORY_CARE_PROVIDER_SITE_OTHER): Payer: Self-pay | Admitting: Bariatrics

## 2020-06-12 NOTE — Progress Notes (Signed)
Chief Complaint:   April Matthews is here to discuss her progress with her obesity treatment plan along with follow-up of her obesity related diagnoses. April Matthews is on the Category 1 Plan and states she is following her eating plan approximately 0% of the time. [name] states she is swimming 60 minutes 7 times per week and walking 6,000 steps 7 times per week.  Today's visit was #: 3 Starting weight: 205 lbs Starting date: 05/09/2020 Today's weight: 206 lbs Today's date: 06/07/2020 Total lbs lost to date: 0 Total lbs lost since last in-office visit: 0  Interim History: April Matthews is up 5 lbs since her last visit. She states that she had family "coming in" and was less strict with the plan.  Subjective:   Diabetes mellitus type 2 in obese ([place]). April Matthews is taking metformin XR.  Lab Results  Component Value Date   HGBA1C 6.7 (A) 02/01/2020   HGBA1C 6.2 (A) 06/02/2019   Lab Results  Component Value Date   MICROALBUR 0.8 11/27/2016   LDLCALC 57 05/09/2020   CREATININE 1.07 (H) 05/09/2020   Lab Results  Component Value Date   INSULIN 25.5 (H) 05/09/2020   Hypertension associated with type 2 diabetes mellitus ([place]). [name] is taking Toprol XL.  BP Readings from Last 3 Encounters:  06/11/20 136/80  06/07/20 140/83  05/23/20 (!) 134/80   Lab Results  Component Value Date   CREATININE 1.07 (H) 05/09/2020   CREATININE 1.16 (H) 06/02/2019   CREATININE 1.13 (H) 09/15/2018   Assessment/Plan:   Diabetes mellitus type 2 in obese ([name]). Good blood sugar control is important to decrease the likelihood of diabetic complications such as nephropathy, neuropathy, limb loss, blindness, coronary artery disease, and death. Intensive lifestyle modification including diet, exercise and weight loss are the first line of treatment for diabetes. April Matthews will continue her medication as directed.   Hypertension associated with type 2 diabetes mellitus ([place]). April Matthews is working on healthy  weight loss and exercise to improve blood pressure control. We will watch for signs of hypotension as she continues her lifestyle modifications. She will continue her medication as directed.   Class 2 severe obesity with serious comorbidity and body mass index (BMI) of 36.0 to 36.9 in adult, unspecified obesity type ([place]).  [name] is currently in the action stage of change. As such, her goal is to continue with weight loss efforts. She has agreed to the Category 1 Plan.   She will work on meal planning, intentional eating, being more adherent to the plan, packing her lunch, and increasing her water intake.  Exercise goals: April Matthews will continue her current exercise regimen and will use a Ecologist.  Behavioral modification strategies: increasing lean protein intake, decreasing simple carbohydrates, increasing vegetables, increasing water intake, decreasing eating out, no skipping meals, meal planning and cooking strategies, keeping healthy foods in the home and planning for success.  April Matthews has agreed to follow-up with our clinic in 2-3 weeks. She was informed of the importance of frequent follow-up visits to maximize her success with intensive lifestyle modifications for her multiple health conditions.   Objective:   Blood pressure 140/83, pulse 81, temperature 98.4 F (36.9 C), height 5\' 3"  (1.6 m), weight 206 lb (93.4 kg), SpO2 94 %. Body mass index is 36.49 kg/m.  General: Cooperative, alert, well developed, in no acute distress. HEENT: Conjunctivae and lids unremarkable. Cardiovascular: Regular rhythm.  Lungs: Normal work of breathing. Neurologic: No focal deficits.   Lab Results  Component Value  Date   CREATININE 1.07 (H) 05/09/2020   BUN 23 05/09/2020   NA 141 05/09/2020   K 4.9 05/09/2020   CL 103 05/09/2020   CO2 24 05/09/2020   Lab Results  Component Value Date   ALT 14 05/09/2020   AST 14 05/09/2020   ALKPHOS 89 05/09/2020   BILITOT 0.4 05/09/2020   Lab Results    Component Value Date   HGBA1C 6.8 (A) 06/11/2020   HGBA1C 6.7 (A) 02/01/2020   HGBA1C 6.2 (A) 06/02/2019   HGBA1C 6.2 (A) 09/15/2018   HGBA1C 6.5 02/25/2018   Lab Results  Component Value Date   INSULIN 25.5 (H) 05/09/2020   Lab Results  Component Value Date   TSH 5.550 (H) 05/09/2020   Lab Results  Component Value Date   CHOL 130 05/09/2020   HDL 47 05/09/2020   LDLCALC 57 05/09/2020   TRIG 156 (H) 05/09/2020   CHOLHDL 2.5 06/02/2019   Lab Results  Component Value Date   WBC 8.0 06/02/2019   HGB 13.6 06/02/2019   HCT 40.6 06/02/2019   MCV 94 06/02/2019   PLT 346 06/02/2019   No results found for: IRON, TIBC, FERRITIN  Obesity Behavioral Intervention Documentation for Insurance:   Approximately 15 minutes were spent on the discussion below.  ASK: We discussed the diagnosis of obesity with April Matthews today and April Matthews agreed to give [place] permission to discuss obesity behavioral modification therapy today.  ASSESS: [name] has the diagnosis of obesity and her BMI today is 36.5. [name] is in the action stage of change.   ADVISE: April Matthews was educated on the multiple health risks of obesity as well as the benefit of weight loss to improve her health. She was advised of the need for long term treatment and the importance of lifestyle modifications to improve her current health and to decrease her risk of future health problems.  AGREE: Multiple dietary modification options and treatment options were discussed and April Matthews agreed to follow the recommendations documented in the above note.  ARRANGE: April Matthews was educated on the importance of frequent visits to treat obesity as outlined per CMS and USPSTF guidelines and agreed to schedule her next follow up appointment today.  Attestation Statements:   Reviewed by clinician on day of visit: allergies, medications, problem list, medical history, surgical history, family history, social history, and previous encounter notes.  April Matthews Dk, am acting as Location manager for [employer], DO   I have reviewed the above documentation for accuracy and completeness, and I agree with the above. April Matthews Lesch, DO

## 2020-06-22 DIAGNOSIS — L4050 Arthropathic psoriasis, unspecified: Secondary | ICD-10-CM | POA: Diagnosis not present

## 2020-06-28 ENCOUNTER — Ambulatory Visit (INDEPENDENT_AMBULATORY_CARE_PROVIDER_SITE_OTHER): Payer: Medicare Other | Admitting: Bariatrics

## 2020-06-28 ENCOUNTER — Other Ambulatory Visit: Payer: Self-pay

## 2020-06-28 ENCOUNTER — Encounter (INDEPENDENT_AMBULATORY_CARE_PROVIDER_SITE_OTHER): Payer: Self-pay | Admitting: Bariatrics

## 2020-06-28 VITALS — BP 133/81 | HR 66 | Temp 97.7°F | Ht 63.0 in | Wt 200.0 lb

## 2020-06-28 DIAGNOSIS — E559 Vitamin D deficiency, unspecified: Secondary | ICD-10-CM

## 2020-06-28 DIAGNOSIS — E669 Obesity, unspecified: Secondary | ICD-10-CM | POA: Diagnosis not present

## 2020-06-28 DIAGNOSIS — E1169 Type 2 diabetes mellitus with other specified complication: Secondary | ICD-10-CM | POA: Diagnosis not present

## 2020-06-28 DIAGNOSIS — Z6835 Body mass index (BMI) 35.0-35.9, adult: Secondary | ICD-10-CM | POA: Diagnosis not present

## 2020-06-28 MED ORDER — VITAMIN D (ERGOCALCIFEROL) 1.25 MG (50000 UNIT) PO CAPS: 50000.0000 [IU] | ORAL_CAPSULE | ORAL | 0 refills | Status: DC

## 2020-07-03 NOTE — Progress Notes (Signed)
Chief Complaint:   April Matthews is here to discuss her progress with her obesity treatment plan along with follow-up of her obesity related diagnoses. April Matthews is on the Category 1 Plan and states she is following her eating plan approximately 50% of the time. April Matthews states she is doing aerobics and swimming 60 minutes 5 times per week and walking 4,000-8,000 steps 7 times per week.  Today's visit was #: 4 Starting weight: 205 lbs Starting date: 05/09/2020 Today's weight: 200 lbs Today's date: 06/28/2020 Total lbs lost to date: 5 Total lbs lost since last in-office visit: 6  Interim History: April Matthews is down 6 lbs and doing well overall. She reports doing well with her water intake and has been adherent to the plan 50% of the time.  Subjective:   Diabetes mellitus type 2 in obese (April Matthews). April Matthews is taking metformin and denies hypoglycemia.   Lab Results  Component Value Date   HGBA1C 6.8 (A) 06/11/2020   HGBA1C 6.7 (A) 02/01/2020   HGBA1C 6.2 (A) 06/02/2019   Lab Results  Component Value Date   MICROALBUR 0.8 11/27/2016   LDLCALC 57 05/09/2020   CREATININE 1.07 (H) 05/09/2020   Lab Results  Component Value Date   INSULIN 25.5 (H) 05/09/2020   Vitamin D deficiency. April Matthews reports minimal sun exposure.   Ref. Range 05/09/2020 14:38  Vitamin D, 25-Hydroxy Latest Ref Range: 30.0 - 100.0 ng/mL 32.7   Assessment/Plan:   Diabetes mellitus type 2 in obese (April Matthews). Good blood sugar control is important to decrease the likelihood of diabetic complications such as nephropathy, neuropathy, limb loss, blindness, coronary artery disease, and death. Intensive lifestyle modification including diet, exercise and weight loss are the first line of treatment for diabetes. April Matthews will continue her medication as directed.   Vitamin D deficiency. Low Vitamin D level contributes to fatigue and are associated with obesity, breast, and colon cancer. She was given a prescription for Vitamin D,  Ergocalciferol, (DRISDOL) 1.25 MG (50000 UNIT) CAPS capsule every week #4 with 0 refills and will follow-up for routine testing of Vitamin D, at least 2-3 times per year to avoid over-replacement.   Class 2 severe obesity with serious comorbidity and body mass index (BMI) of 35.0 to 35.9 in adult, unspecified obesity type (April Matthews).  April Matthews is currently in the action stage of change. As such, her goal is to continue with weight loss efforts. She has agreed to the Category 1 Plan.   She will work on meal planning and intentional eating.   Exercise goals: April Matthews will continue her current exercise regimen.   Behavioral modification strategies: increasing lean protein intake, decreasing simple carbohydrates, increasing vegetables, increasing water intake, increasing high fiber foods, decreasing eating out, no skipping meals, meal planning and cooking strategies, keeping healthy foods in the home and better snacking choices.  April Matthews has agreed to follow-up with our clinic in 2-3 weeks. She was informed of the importance of frequent follow-up visits to maximize her success with intensive lifestyle modifications for her multiple health conditions.   Objective:   Blood pressure 133/81, pulse 66, temperature 97.7 F (36.5 C), height 5\' 3"  (1.6 m), weight 200 lb (90.7 kg), SpO2 95 %. Body mass index is 35.43 kg/m.  General: Cooperative, alert, well developed, in no acute distress. HEENT: Conjunctivae and lids unremarkable. Cardiovascular: Regular rhythm.  Lungs: Normal work of breathing. Neurologic: No focal deficits.   Lab Results  Component Value Date   CREATININE 1.07 (H) 05/09/2020   BUN  23 05/09/2020   NA 141 05/09/2020   K 4.9 05/09/2020   CL 103 05/09/2020   CO2 24 05/09/2020   Lab Results  Component Value Date   ALT 14 05/09/2020   AST 14 05/09/2020   ALKPHOS 89 05/09/2020   BILITOT 0.4 05/09/2020   Lab Results  Component Value Date   HGBA1C 6.8 (A) 06/11/2020   HGBA1C 6.7 (A)  02/01/2020   HGBA1C 6.2 (A) 06/02/2019   HGBA1C 6.2 (A) 09/15/2018   HGBA1C 6.5 02/25/2018   Lab Results  Component Value Date   INSULIN 25.5 (H) 05/09/2020   Lab Results  Component Value Date   TSH 5.550 (H) 05/09/2020   Lab Results  Component Value Date   CHOL 130 05/09/2020   HDL 47 05/09/2020   LDLCALC 57 05/09/2020   TRIG 156 (H) 05/09/2020   CHOLHDL 2.5 06/02/2019   Lab Results  Component Value Date   WBC 8.0 06/02/2019   HGB 13.6 06/02/2019   HCT 40.6 06/02/2019   MCV 94 06/02/2019   PLT 346 06/02/2019   No results found for: IRON, TIBC, FERRITIN  Obesity Behavioral Intervention Documentation for Insurance:   Approximately 15 minutes were spent on the discussion below.  ASK: We discussed the diagnosis of obesity with [name] today and [name] agreed to give [place] permission to discuss obesity behavioral modification therapy today.  ASSESS: [name] has the diagnosis of obesity and her BMI today is 35.5. [name] is in the action stage of change.   ADVISE: [name] was educated on the multiple health risks of obesity as well as the benefit of weight loss to improve her health. She was advised of the need for long term treatment and the importance of lifestyle modifications to improve her current health and to decrease her risk of future health problems.  AGREE: Multiple dietary modification options and treatment options were discussed and [name] agreed to follow the recommendations documented in the above note.  ARRANGE: [name] was educated on the importance of frequent visits to treat obesity as outlined per CMS and USPSTF guidelines and agreed to schedule her next follow up appointment today.  Attestation Statements:   Reviewed by clinician on day of visit: allergies, medications, problem list, medical history, surgical history, family history, social history, and previous encounter notes.  [name], am acting as Location manager for [employer], DO   I have  reviewed the above documentation for accuracy and completeness, and I agree with the above. Jearld Lesch, DO

## 2020-07-05 ENCOUNTER — Encounter (INDEPENDENT_AMBULATORY_CARE_PROVIDER_SITE_OTHER): Payer: Self-pay | Admitting: Bariatrics

## 2020-07-18 ENCOUNTER — Encounter (INDEPENDENT_AMBULATORY_CARE_PROVIDER_SITE_OTHER): Payer: Self-pay

## 2020-07-19 ENCOUNTER — Other Ambulatory Visit: Payer: Self-pay

## 2020-07-19 ENCOUNTER — Encounter (INDEPENDENT_AMBULATORY_CARE_PROVIDER_SITE_OTHER): Payer: Self-pay | Admitting: Bariatrics

## 2020-07-19 ENCOUNTER — Ambulatory Visit (INDEPENDENT_AMBULATORY_CARE_PROVIDER_SITE_OTHER): Payer: Medicare Other | Admitting: Bariatrics

## 2020-07-19 VITALS — BP 136/82 | HR 70 | Temp 98.0°F | Ht 63.0 in | Wt 198.0 lb

## 2020-07-19 DIAGNOSIS — I10 Essential (primary) hypertension: Secondary | ICD-10-CM | POA: Diagnosis not present

## 2020-07-19 DIAGNOSIS — Z6835 Body mass index (BMI) 35.0-35.9, adult: Secondary | ICD-10-CM | POA: Diagnosis not present

## 2020-07-19 DIAGNOSIS — J309 Allergic rhinitis, unspecified: Secondary | ICD-10-CM | POA: Diagnosis not present

## 2020-07-19 MED ORDER — IPRATROPIUM BROMIDE 0.03 % NA SOLN: NASAL | 0 refills | Status: DC

## 2020-07-23 NOTE — Progress Notes (Signed)
Chief Complaint:   OBESITY Cicily is here to discuss her progress with her obesity treatment plan along with follow-up of her obesity related diagnoses. Khalise is on the Category 1 Plan and states she is following her eating plan approximately 80% of the time. Annmargaret states she is swimming for 60 minutes 5 times per week and walking for 30 minutes 7 times per week.  Today's visit was #: 5 Starting weight: 205 lbs Starting date: 05/09/2020 Today's weight: 198 lbs Today's date: 07/19/2020 Total lbs lost to date: 7 lbs Total lbs lost since last in-office visit: 2 lbs  Interim History: Jennah is down 2 pounds since her last visit.  She has been struggling and feels that she is at a plateau.  She is doing well with her water.  Subjective:   1. Allergic rhinitis, unspecified seasonality, unspecified trigger Atrovent has been effective (allergy season).  2. Essential hypertension Review: taking medications as instructed, no medication side effects noted, no chest pain on exertion, no dyspnea on exertion, no swelling of ankles.  Controlled.  Taking Cozaar and Toprol XL.  BP Readings from Last 3 Encounters:  07/19/20 136/82  06/28/20 133/81  06/11/20 136/80   Assessment/Plan:   1. Allergic rhinitis, unspecified seasonality, unspecified trigger Will refill Atrovent today, as per below.  -Refill ipratropium (ATROVENT) 0.03 % nasal spray; USE 2 SPRAYS IN EACH NOSTRIL EVERY 12 HOURS  Dispense: 30 mL; Refill: 0  2. Essential hypertension Bradie is working on healthy weight loss and exercise to improve blood pressure control. We will watch for signs of hypotension as she continues her lifestyle modifications.  Continue medications.  3. Class 2 severe obesity with serious comorbidity and body mass index (BMI) of 35.0 to 35.9 in adult, unspecified obesity type ([place])  Zarahi is currently in the action stage of change. As such, her goal is to continue with weight loss efforts. She has  agreed to the Category 1 Plan.  She will work on meal planning, will weigh meats (scale given) and will increase protein intake.   Exercise goals: Will increase her exercise (swimming).  Behavioral modification strategies: increasing lean protein intake, decreasing simple carbohydrates, increasing vegetables, increasing water intake, decreasing eating out, no skipping meals, meal planning and cooking strategies, keeping healthy foods in the home and planning for success.  Asiyah has agreed to follow-up with our clinic in 3 weeks. She was informed of the importance of frequent follow-up visits to maximize her success with intensive lifestyle modifications for her multiple health conditions.   Objective:   Blood pressure 136/82, pulse 70, temperature 98 F (36.7 C), height 5\' 3"  (1.6 m), weight 198 lb (89.8 kg), SpO2 97 %. Body mass index is 35.07 kg/m.  General: Cooperative, alert, well developed, in no acute distress. HEENT: Conjunctivae and lids unremarkable. Cardiovascular: Regular rhythm.  Lungs: Normal work of breathing. Neurologic: No focal deficits.   Lab Results  Component Value Date   CREATININE 1.07 (H) 05/09/2020   BUN 23 05/09/2020   NA 141 05/09/2020   K 4.9 05/09/2020   CL 103 05/09/2020   CO2 24 05/09/2020   Lab Results  Component Value Date   ALT 14 05/09/2020   AST 14 05/09/2020   ALKPHOS 89 05/09/2020   BILITOT 0.4 05/09/2020   Lab Results  Component Value Date   HGBA1C 6.8 (A) 06/11/2020   HGBA1C 6.7 (A) 02/01/2020   HGBA1C 6.2 (A) 06/02/2019   HGBA1C 6.2 (A) 09/15/2018   HGBA1C 6.5 02/25/2018  Lab Results  Component Value Date   INSULIN 25.5 (H) 05/09/2020   Lab Results  Component Value Date   TSH 5.550 (H) 05/09/2020   Lab Results  Component Value Date   CHOL 130 05/09/2020   HDL 47 05/09/2020   LDLCALC 57 05/09/2020   TRIG 156 (H) 05/09/2020   CHOLHDL 2.5 06/02/2019   Lab Results  Component Value Date   WBC 8.0 06/02/2019   HGB  13.6 06/02/2019   HCT 40.6 06/02/2019   MCV 94 06/02/2019   PLT 346 06/02/2019   Obesity Behavioral Intervention:   Approximately 15 minutes were spent on the discussion below.  ASK: We discussed the diagnosis of obesity with Chayah today and [place] agreed to give [place] permission to discuss obesity behavioral modification therapy today.  ASSESS: Kitara has the diagnosis of obesity and her BMI today is 35.1. Azani is in the action stage of change.   ADVISE: Zenaida was educated on the multiple health risks of obesity as well as the benefit of weight loss to improve her health. She was advised of the need for long term treatment and the importance of lifestyle modifications to improve her current health and to decrease her risk of future health problems.  AGREE: Multiple dietary modification options and treatment options were discussed and Estefania agreed to follow the recommendations documented in the above note.  ARRANGE: Aarilyn was educated on the importance of frequent visits to treat obesity as outlined per CMS and USPSTF guidelines and agreed to schedule her next follow up appointment today.  Attestation Statements:   Reviewed by clinician on day of visit: allergies, medications, problem list, medical history, surgical history, family history, social history, and previous encounter notes.  I, Water quality scientist, CMA, am acting as Location manager for [employer], DO  I have reviewed the above documentation for accuracy and completeness, and I agree with the above. Jearld Lesch, DO

## 2020-07-24 ENCOUNTER — Encounter (INDEPENDENT_AMBULATORY_CARE_PROVIDER_SITE_OTHER): Payer: Self-pay | Admitting: Bariatrics

## 2020-07-26 ENCOUNTER — Other Ambulatory Visit (INDEPENDENT_AMBULATORY_CARE_PROVIDER_SITE_OTHER): Payer: Self-pay | Admitting: Bariatrics

## 2020-07-26 DIAGNOSIS — E559 Vitamin D deficiency, unspecified: Secondary | ICD-10-CM

## 2020-08-06 DIAGNOSIS — Z23 Encounter for immunization: Secondary | ICD-10-CM | POA: Diagnosis not present

## 2020-08-15 ENCOUNTER — Encounter (INDEPENDENT_AMBULATORY_CARE_PROVIDER_SITE_OTHER): Payer: Self-pay | Admitting: Bariatrics

## 2020-08-15 ENCOUNTER — Ambulatory Visit (INDEPENDENT_AMBULATORY_CARE_PROVIDER_SITE_OTHER): Payer: Medicare Other | Admitting: Bariatrics

## 2020-08-15 ENCOUNTER — Other Ambulatory Visit: Payer: Self-pay

## 2020-08-15 VITALS — BP 142/88 | HR 77 | Temp 98.1°F | Ht 63.0 in | Wt 196.0 lb

## 2020-08-15 DIAGNOSIS — E559 Vitamin D deficiency, unspecified: Secondary | ICD-10-CM

## 2020-08-15 DIAGNOSIS — E669 Obesity, unspecified: Secondary | ICD-10-CM | POA: Diagnosis not present

## 2020-08-15 DIAGNOSIS — I10 Essential (primary) hypertension: Secondary | ICD-10-CM

## 2020-08-15 DIAGNOSIS — Z6834 Body mass index (BMI) 34.0-34.9, adult: Secondary | ICD-10-CM

## 2020-08-16 NOTE — Progress Notes (Signed)
Chief Complaint:   April Matthews is here to discuss her progress with her obesity treatment plan along with follow-up of her obesity related diagnoses. April Matthews is on the Category 1 Plan and states she is following her eating plan approximately 80% of the time. April Matthews states she is exercising 0 minutes 0 times per week.  Today's visit was #: 6 Starting weight: 205 lbs Starting date: 05/09/2020 Today's weight: 196 lbs Today's date: 08/15/2020 Total lbs lost to date: 9 Total lbs lost since last in-office visit: 2  Interim History: April Matthews is down 2 lbs and doing well overall. She has been sick with a cold, but now resolved. She has been exercising less.  Subjective:   Essential hypertension. April Matthews is taking Cozaar and Toprol XL. Blood pressure is reasonably well controlled.  BP Readings from Last 3 Encounters:  08/15/20 (!) 142/88  07/19/20 136/82  06/28/20 133/81   Lab Results  Component Value Date   CREATININE 1.07 (H) 05/09/2020   CREATININE 1.16 (H) 06/02/2019   CREATININE 1.13 (H) 09/15/2018   Vitamin D deficiency. April Matthews is taking Vitamin D supplementation.    Ref. Range 05/09/2020 14:38  Vitamin D, 25-Hydroxy Latest Ref Range: 30.0 - 100.0 ng/mL 32.7   Assessment/Plan:   Essential hypertension. April Matthews is working on healthy weight loss and exercise to improve blood pressure control. We will watch for signs of hypotension as she continues her lifestyle modifications. She will continue her medications as directed.   Vitamin D deficiency. Low Vitamin D level contributes to fatigue and are associated with obesity, breast, and colon cancer. She agrees to continue to take Vitamin D as directed and will follow-up for routine testing of Vitamin D, at least 2-3 times per year to avoid over-replacement.  Class 1 obesity with serious comorbidity and body mass index (BMI) of 34.0 to 34.9 in adult, unspecified obesity type.  April Matthews is currently in the action stage of  change. As such, her goal is to continue with weight loss efforts. She has agreed to the Category 1 Plan.   She will work on meal planning, intentional eating, and adhering more closely to the program.  Exercise goals: April Matthews will resume exercising.  Behavioral modification strategies: increasing lean protein intake, decreasing simple carbohydrates, increasing vegetables, increasing water intake, decreasing eating out, no skipping meals, meal planning and cooking strategies, keeping healthy foods in the home and planning for success.  April Matthews has agreed to follow-up with our clinic in 2-3 weeks. She was informed of the importance of frequent follow-up visits to maximize her success with intensive lifestyle modifications for her multiple health conditions.   Objective:   Blood pressure (!) 142/88, pulse 77, temperature 98.1 F (36.7 C), height 5\' 3"  (1.6 m), weight 196 lb (88.9 kg), SpO2 96 %. Body mass index is 34.72 kg/m.  General: Cooperative, alert, well developed, in no acute distress. HEENT: Conjunctivae and lids unremarkable. Cardiovascular: Regular rhythm.  Lungs: Normal work of breathing. Neurologic: No focal deficits.   Lab Results  Component Value Date   CREATININE 1.07 (H) 05/09/2020   BUN 23 05/09/2020   NA 141 05/09/2020   K 4.9 05/09/2020   CL 103 05/09/2020   CO2 24 05/09/2020   Lab Results  Component Value Date   ALT 14 05/09/2020   AST 14 05/09/2020   ALKPHOS 89 05/09/2020   BILITOT 0.4 05/09/2020   Lab Results  Component Value Date   HGBA1C 6.8 (A) 06/11/2020   HGBA1C 6.7 (A) 02/01/2020  HGBA1C 6.2 (A) 06/02/2019   HGBA1C 6.2 (A) 09/15/2018   HGBA1C 6.5 02/25/2018   Lab Results  Component Value Date   INSULIN 25.5 (H) 05/09/2020   Lab Results  Component Value Date   TSH 5.550 (H) 05/09/2020   Lab Results  Component Value Date   CHOL 130 05/09/2020   HDL 47 05/09/2020   LDLCALC 57 05/09/2020   TRIG 156 (H) 05/09/2020   CHOLHDL 2.5  06/02/2019   Lab Results  Component Value Date   WBC 8.0 06/02/2019   HGB 13.6 06/02/2019   HCT 40.6 06/02/2019   MCV 94 06/02/2019   PLT 346 06/02/2019   No results found for: IRON, TIBC, FERRITIN  Obesity Behavioral Intervention:   Approximately 15 minutes were spent on the discussion below.  ASK: We discussed the diagnosis of obesity with April Matthews today and April Matthews agreed to give [place] permission to discuss obesity behavioral modification therapy today.  ASSESS: April Matthews has the diagnosis of obesity and her BMI today is 34.8. April Matthews is in the action stage of change.   ADVISE: April Matthews was educated on the multiple health risks of obesity as well as the benefit of weight loss to improve her health. She was advised of the need for long term treatment and the importance of lifestyle modifications to improve her current health and to decrease her risk of future health problems.  AGREE: Multiple dietary modification options and treatment options were discussed and April Matthews agreed to follow the recommendations documented in the above note.  ARRANGE: April Matthews was educated on the importance of frequent visits to treat obesity as outlined per CMS and USPSTF guidelines and agreed to schedule her next follow up appointment today.  Attestation Statements:   Reviewed by clinician on day of visit: allergies, medications, problem list, medical history, surgical history, family history, social history, and previous encounter notes.  [name], am acting as Location manager for [employer], DO   I have reviewed the above documentation for accuracy and completeness, and I agree with the above. Jearld Lesch, DO

## 2020-08-22 ENCOUNTER — Other Ambulatory Visit (INDEPENDENT_AMBULATORY_CARE_PROVIDER_SITE_OTHER): Payer: Self-pay | Admitting: Bariatrics

## 2020-08-22 DIAGNOSIS — E559 Vitamin D deficiency, unspecified: Secondary | ICD-10-CM

## 2020-08-22 NOTE — Telephone Encounter (Signed)
Last seen by Dr. Brown. 

## 2020-08-25 ENCOUNTER — Encounter (INDEPENDENT_AMBULATORY_CARE_PROVIDER_SITE_OTHER): Payer: Self-pay | Admitting: Bariatrics

## 2020-09-05 ENCOUNTER — Other Ambulatory Visit: Payer: Self-pay | Admitting: Family Medicine

## 2020-09-05 ENCOUNTER — Encounter (INDEPENDENT_AMBULATORY_CARE_PROVIDER_SITE_OTHER): Payer: Self-pay | Admitting: Bariatrics

## 2020-09-05 ENCOUNTER — Other Ambulatory Visit: Payer: Self-pay

## 2020-09-05 ENCOUNTER — Ambulatory Visit (INDEPENDENT_AMBULATORY_CARE_PROVIDER_SITE_OTHER): Payer: Medicare Other | Admitting: Bariatrics

## 2020-09-05 VITALS — BP 131/82 | HR 67 | Temp 97.5°F | Ht 63.0 in | Wt 195.0 lb

## 2020-09-05 DIAGNOSIS — E559 Vitamin D deficiency, unspecified: Secondary | ICD-10-CM | POA: Diagnosis not present

## 2020-09-05 DIAGNOSIS — I10 Essential (primary) hypertension: Secondary | ICD-10-CM | POA: Diagnosis not present

## 2020-09-05 DIAGNOSIS — Z6834 Body mass index (BMI) 34.0-34.9, adult: Secondary | ICD-10-CM

## 2020-09-05 DIAGNOSIS — E669 Obesity, unspecified: Secondary | ICD-10-CM

## 2020-09-05 MED ORDER — VITAMIN D (ERGOCALCIFEROL) 1.25 MG (50000 UNIT) PO CAPS: 50000.0000 [IU] | ORAL_CAPSULE | ORAL | 0 refills | Status: DC

## 2020-09-05 MED ORDER — LOSARTAN POTASSIUM 50 MG PO TABS: ORAL_TABLET | ORAL | 0 refills | Status: DC

## 2020-09-06 ENCOUNTER — Encounter (INDEPENDENT_AMBULATORY_CARE_PROVIDER_SITE_OTHER): Payer: Self-pay | Admitting: Bariatrics

## 2020-09-06 ENCOUNTER — Other Ambulatory Visit (INDEPENDENT_AMBULATORY_CARE_PROVIDER_SITE_OTHER): Payer: Self-pay | Admitting: Bariatrics

## 2020-09-06 DIAGNOSIS — M79605 Pain in left leg: Secondary | ICD-10-CM | POA: Diagnosis not present

## 2020-09-06 DIAGNOSIS — Z79899 Other long term (current) drug therapy: Secondary | ICD-10-CM | POA: Diagnosis not present

## 2020-09-06 DIAGNOSIS — L401 Generalized pustular psoriasis: Secondary | ICD-10-CM | POA: Diagnosis not present

## 2020-09-06 DIAGNOSIS — J309 Allergic rhinitis, unspecified: Secondary | ICD-10-CM

## 2020-09-06 DIAGNOSIS — E669 Obesity, unspecified: Secondary | ICD-10-CM | POA: Diagnosis not present

## 2020-09-06 DIAGNOSIS — L4050 Arthropathic psoriasis, unspecified: Secondary | ICD-10-CM | POA: Diagnosis not present

## 2020-09-06 DIAGNOSIS — Z6834 Body mass index (BMI) 34.0-34.9, adult: Secondary | ICD-10-CM | POA: Diagnosis not present

## 2020-09-06 DIAGNOSIS — E559 Vitamin D deficiency, unspecified: Secondary | ICD-10-CM | POA: Diagnosis not present

## 2020-09-06 DIAGNOSIS — M150 Primary generalized (osteo)arthritis: Secondary | ICD-10-CM | POA: Diagnosis not present

## 2020-09-06 DIAGNOSIS — R7989 Other specified abnormal findings of blood chemistry: Secondary | ICD-10-CM | POA: Diagnosis not present

## 2020-09-06 NOTE — Progress Notes (Signed)
Chief Complaint:   April Matthews is here to discuss her progress with her obesity treatment plan along with follow-up of her obesity related diagnoses. April Matthews is on the Category 1 Plan and states she is following her eating plan approximately 85% of the time. April Matthews states she is walking 5,000 steps daily with weights 7 times per week.  Today's visit was #: 7 Starting weight: 205 lbs Starting date: 05/09/2020 Today's weight: 195 lbs Today's date: 09/05/2020 Total lbs lost to date: 10 Total lbs lost since last in-office visit: 1  Interim History: April Matthews is down 1 lb. She reports doing okay with her water intake.  Subjective:   Essential hypertension. April Matthews is taking losartan and Toprol XL.  BP Readings from Last 3 Encounters:  09/05/20 131/82  08/15/20 (!) 142/88  07/19/20 136/82   Lab Results  Component Value Date   CREATININE 1.07 (H) 05/09/2020   CREATININE 1.16 (H) 06/02/2019   CREATININE 1.13 (H) 09/15/2018   Vitamin D deficiency. April Matthews reports minimal sun exposure.  Assessment/Plan:   Essential hypertension. April Matthews is working on healthy weight loss and exercise to improve blood pressure control. We will watch for signs of hypotension as she continues her lifestyle modifications. She will continue her medications as directed. Prescription was given for losartan (COZAAR) 50 MG tablet 1 PO daily #90 with 0 refills.  Vitamin D deficiency. Low Vitamin D level contributes to fatigue and are associated with obesity, breast, and colon cancer. She was given a prescription for Vitamin D, Ergocalciferol, (DRISDOL) 1.25 MG (50000 UNIT) CAPS capsule every week #4 with 0 refills and will follow-up for routine testing of Vitamin D, at least 2-3 times per year to avoid over-replacement.   Class 1 obesity with serious comorbidity and body mass index (BMI) of 34.0 to 34.9 in adult, unspecified obesity type.  April Matthews is currently in the action stage of change. As such, her  goal is to continue with weight loss efforts. She has agreed to keeping a food journal and adhering to recommended goals of 1000 calories and 75-80 grams of protein.   April Matthews will stay adherent to the plan and will journal most days.  April Matthews will be performed at her next office visit; she will arrive 30 minutes early.  Exercise goals: April Matthews will continue to walk for exercise.  Behavioral modification strategies: increasing lean protein intake, decreasing simple carbohydrates, increasing vegetables, increasing water intake, decreasing eating out, no skipping meals, meal planning and cooking strategies, keeping healthy foods in the home, holiday eating strategies , celebration eating strategies and planning for success.  April Matthews has agreed to follow-up with our clinic in 2-3 weeks. She was informed of the importance of frequent follow-up visits to maximize her success with intensive lifestyle modifications for her multiple health conditions.   Objective:   Blood pressure 131/82, pulse 67, temperature (!) 97.5 F (36.4 C), temperature source Oral, height 5\' 3"  (1.6 m), weight 195 lb (88.5 kg), SpO2 95 %. Body mass index is 34.54 kg/m.  General: Cooperative, alert, well developed, in no acute distress. HEENT: Conjunctivae and lids unremarkable. Cardiovascular: Regular rhythm.  Lungs: Normal work of breathing. Neurologic: No focal deficits.   Lab Results  Component Value Date   CREATININE 1.07 (H) 05/09/2020   BUN 23 05/09/2020   NA 141 05/09/2020   K 4.9 05/09/2020   CL 103 05/09/2020   CO2 24 05/09/2020   Lab Results  Component Value Date   ALT 14 05/09/2020   AST  14 05/09/2020   ALKPHOS 89 05/09/2020   BILITOT 0.4 05/09/2020   Lab Results  Component Value Date   HGBA1C 6.8 (A) 06/11/2020   HGBA1C 6.7 (A) 02/01/2020   HGBA1C 6.2 (A) 06/02/2019   HGBA1C 6.2 (A) 09/15/2018   HGBA1C 6.5 02/25/2018   Lab Results  Component Value Date   INSULIN 25.5 (H) 05/09/2020   Lab  Results  Component Value Date   TSH 5.550 (H) 05/09/2020   Lab Results  Component Value Date   CHOL 130 05/09/2020   HDL 47 05/09/2020   LDLCALC 57 05/09/2020   TRIG 156 (H) 05/09/2020   CHOLHDL 2.5 06/02/2019   Lab Results  Component Value Date   WBC 8.0 06/02/2019   HGB 13.6 06/02/2019   HCT 40.6 06/02/2019   MCV 94 06/02/2019   PLT 346 06/02/2019   No results found for: IRON, TIBC, FERRITIN  Obesity Behavioral Intervention:   Approximately 15 minutes were spent on the discussion below.  ASK: We discussed the diagnosis of obesity with April Matthews today and April Matthews agreed to give [place] permission to discuss obesity behavioral modification therapy today.  ASSESS: April Matthews has the diagnosis of obesity and her BMI today is 34.5. April Matthews is in the action stage of change.   ADVISE: April Matthews was educated on the multiple health risks of obesity as well as the benefit of weight loss to improve her health. She was advised of the need for long term treatment and the importance of lifestyle modifications to improve her current health and to decrease her risk of future health problems.  AGREE: Multiple dietary modification options and treatment options were discussed and April Matthews agreed to follow the recommendations documented in the above note.  ARRANGE: April Matthews was educated on the importance of frequent visits to treat obesity as outlined per CMS and USPSTF guidelines and agreed to schedule her next follow up appointment today.  Attestation Statements:   Reviewed by clinician on day of visit: allergies, medications, problem list, medical history, surgical history, family history, social history, and previous encounter notes.  [name], am acting as Location manager for [employer], DO   I have reviewed the above documentation for accuracy and completeness, and I agree with the above. Jearld Lesch, DO

## 2020-09-07 DIAGNOSIS — L400 Psoriasis vulgaris: Secondary | ICD-10-CM | POA: Diagnosis not present

## 2020-09-07 DIAGNOSIS — D1801 Hemangioma of skin and subcutaneous tissue: Secondary | ICD-10-CM | POA: Diagnosis not present

## 2020-09-07 DIAGNOSIS — D225 Melanocytic nevi of trunk: Secondary | ICD-10-CM | POA: Diagnosis not present

## 2020-09-07 DIAGNOSIS — D692 Other nonthrombocytopenic purpura: Secondary | ICD-10-CM | POA: Diagnosis not present

## 2020-09-18 ENCOUNTER — Other Ambulatory Visit: Payer: Self-pay

## 2020-09-18 ENCOUNTER — Ambulatory Visit (INDEPENDENT_AMBULATORY_CARE_PROVIDER_SITE_OTHER): Payer: Medicare Other | Admitting: Bariatrics

## 2020-09-18 ENCOUNTER — Encounter (INDEPENDENT_AMBULATORY_CARE_PROVIDER_SITE_OTHER): Payer: Self-pay | Admitting: Bariatrics

## 2020-09-18 VITALS — BP 143/85 | HR 65 | Temp 97.7°F | Ht 63.0 in | Wt 194.0 lb

## 2020-09-18 DIAGNOSIS — R5383 Other fatigue: Secondary | ICD-10-CM | POA: Diagnosis not present

## 2020-09-18 DIAGNOSIS — E559 Vitamin D deficiency, unspecified: Secondary | ICD-10-CM

## 2020-09-18 DIAGNOSIS — Z6834 Body mass index (BMI) 34.0-34.9, adult: Secondary | ICD-10-CM | POA: Diagnosis not present

## 2020-09-18 DIAGNOSIS — R0602 Shortness of breath: Secondary | ICD-10-CM

## 2020-09-18 DIAGNOSIS — E669 Obesity, unspecified: Secondary | ICD-10-CM

## 2020-09-18 MED ORDER — VITAMIN D (ERGOCALCIFEROL) 1.25 MG (50000 UNIT) PO CAPS: 50000.0000 [IU] | ORAL_CAPSULE | ORAL | 0 refills | Status: DC

## 2020-09-19 NOTE — Progress Notes (Signed)
Chief Complaint:   April Matthews is here to discuss her progress with her obesity treatment plan along with follow-up of her obesity related diagnoses. April Matthews is on the Category 1 Plan and states she is following her eating plan approximately 90% of the time. April Matthews states she is walking 5,000 steps with weights 7 times per week.  Today's visit was #: 8 Starting weight: 205 lbs Starting date: 05/09/2020 Today's weight: 194 lbs Today's date: 09/18/2020 Total lbs lost to date: 11 Total lbs lost since last in-office visit: 1  Interim History: April Matthews is down 1 additional lb since her last visit. She has been journaling some. She is doing some intermittent fasting 11:30 to 12 noon and stop at about 7:30 to 8:00 p.m.  Subjective:   Vitamin D deficiency. No nausea, vomiting, or muscle weakness.    Ref. Range 05/09/2020 14:38  Vitamin D, 25-Hydroxy Latest Ref Range: 30.0 - 100.0 ng/mL 32.7   Other fatigue and SOB. April Matthews endorses fatigue and shortness of breath with certain activities. IC July 2021 was 1493; IC today 1800.  Assessment/Plan:   Vitamin D deficiency. Low Vitamin D level contributes to fatigue and are associated with obesity, breast, and colon cancer. She was given a prescription for Vitamin D, Ergocalciferol, (DRISDOL) 1.25 MG (50000 UNIT) CAPS capsule every week #4 with 0 refills and will follow-up for routine testing of Vitamin D, at least 2-3 times per year to avoid over-replacement.   Other fatigue and SOB. April Matthews will gradually increase her activities as tolerated.  Class 1 obesity with serious comorbidity and body mass index (BMI) of 34.0 to 34.9 in adult, unspecified obesity type.  April Matthews is currently in the action stage of change. As such, her goal is to continue with weight loss efforts. She has agreed to the Category 2 Plan and will journal 1200 calories and 80-90 grams of protein with intermittent fasting 11:30 a.m. to 7:30 p.m.    She will work on  meal planning and mindful eating. She has cut down on carbohydrates and refined sweets.  Exercise goals: Older adults should follow the adult guidelines. When older adults cannot meet the adult guidelines, they should be as physically active as their abilities and conditions will allow.   Behavioral modification strategies: increasing lean protein intake, decreasing simple carbohydrates, increasing vegetables, increasing water intake, decreasing eating out, no skipping meals, meal planning and cooking strategies, keeping healthy foods in the home, travel eating strategies, holiday eating strategies , celebration eating strategies, avoiding temptations and planning for success.  April Matthews has agreed to follow-up with our clinic in 3 weeks. She was informed of the importance of frequent follow-up visits to maximize her success with intensive lifestyle modifications for her multiple health conditions.   Objective:   Blood pressure (!) 143/85, pulse 65, temperature 97.7 F (36.5 C), height 5\' 3"  (1.6 m), weight 194 lb (88 kg), SpO2 97 %. Body mass index is 34.37 kg/m.  General: Cooperative, alert, well developed, in no acute distress. HEENT: Conjunctivae and lids unremarkable. Cardiovascular: Regular rhythm.  Lungs: Normal work of breathing. Neurologic: No focal deficits.   Lab Results  Component Value Date   CREATININE 1.07 (H) 05/09/2020   BUN 23 05/09/2020   NA 141 05/09/2020   K 4.9 05/09/2020   CL 103 05/09/2020   CO2 24 05/09/2020   Lab Results  Component Value Date   ALT 14 05/09/2020   AST 14 05/09/2020   ALKPHOS 89 05/09/2020   BILITOT 0.4  05/09/2020   Lab Results  Component Value Date   HGBA1C 6.8 (A) 06/11/2020   HGBA1C 6.7 (A) 02/01/2020   HGBA1C 6.2 (A) 06/02/2019   HGBA1C 6.2 (A) 09/15/2018   HGBA1C 6.5 02/25/2018   Lab Results  Component Value Date   INSULIN 25.5 (H) 05/09/2020   Lab Results  Component Value Date   TSH 5.550 (H) 05/09/2020   Lab Results    Component Value Date   CHOL 130 05/09/2020   HDL 47 05/09/2020   LDLCALC 57 05/09/2020   TRIG 156 (H) 05/09/2020   CHOLHDL 2.5 06/02/2019   Lab Results  Component Value Date   WBC 8.0 06/02/2019   HGB 13.6 06/02/2019   HCT 40.6 06/02/2019   MCV 94 06/02/2019   PLT 346 06/02/2019   No results found for: IRON, TIBC, FERRITIN  Obesity Behavioral Intervention:   Approximately 15 minutes were spent on the discussion below.  ASK: We discussed the diagnosis of obesity with April Matthews today and April Matthews agreed to give [place] permission to discuss obesity behavioral modification therapy today.  ASSESS: April Matthews has the diagnosis of obesity and her BMI today is 34.4. April Matthews is in the action stage of change.   ADVISE: April Matthews was educated on the multiple health risks of obesity as well as the benefit of weight loss to improve her health. She was advised of the need for long term treatment and the importance of lifestyle modifications to improve her current health and to decrease her risk of future health problems.  AGREE: Multiple dietary modification options and treatment options were discussed and April Matthews agreed to follow the recommendations documented in the above note.  ARRANGE: April Matthews was educated on the importance of frequent visits to treat obesity as outlined per CMS and USPSTF guidelines and agreed to schedule her next follow up appointment today.  Attestation Statements:   Reviewed by clinician on day of visit: allergies, medications, problem list, medical history, surgical history, family history, social history, and previous encounter notes.  [name], am acting as Location manager for [employer], DO   I have reviewed the above documentation for accuracy and completeness, and I agree with the above. Jearld Lesch, DO

## 2020-09-24 ENCOUNTER — Encounter (INDEPENDENT_AMBULATORY_CARE_PROVIDER_SITE_OTHER): Payer: Self-pay | Admitting: Bariatrics

## 2020-09-25 ENCOUNTER — Telehealth: Payer: Self-pay | Admitting: Family Medicine

## 2020-09-25 NOTE — Telephone Encounter (Signed)
Received records from [facility]

## 2020-09-30 ENCOUNTER — Other Ambulatory Visit: Payer: Self-pay | Admitting: Family Medicine

## 2020-09-30 DIAGNOSIS — I10 Essential (primary) hypertension: Secondary | ICD-10-CM

## 2020-09-30 DIAGNOSIS — E7800 Pure hypercholesterolemia, unspecified: Secondary | ICD-10-CM

## 2020-10-01 ENCOUNTER — Other Ambulatory Visit: Payer: Self-pay | Admitting: Family Medicine

## 2020-10-01 DIAGNOSIS — E559 Vitamin D deficiency, unspecified: Secondary | ICD-10-CM

## 2020-10-03 ENCOUNTER — Other Ambulatory Visit: Payer: Self-pay | Admitting: Family Medicine

## 2020-10-03 DIAGNOSIS — E7800 Pure hypercholesterolemia, unspecified: Secondary | ICD-10-CM

## 2020-10-15 ENCOUNTER — Other Ambulatory Visit (INDEPENDENT_AMBULATORY_CARE_PROVIDER_SITE_OTHER): Payer: Self-pay | Admitting: Bariatrics

## 2020-10-15 ENCOUNTER — Ambulatory Visit (INDEPENDENT_AMBULATORY_CARE_PROVIDER_SITE_OTHER): Payer: Medicare Other | Admitting: Bariatrics

## 2020-10-15 ENCOUNTER — Other Ambulatory Visit: Payer: Self-pay

## 2020-10-15 ENCOUNTER — Encounter (INDEPENDENT_AMBULATORY_CARE_PROVIDER_SITE_OTHER): Payer: Self-pay | Admitting: Bariatrics

## 2020-10-15 VITALS — BP 148/83 | HR 63 | Temp 97.9°F | Ht 63.0 in | Wt 192.0 lb

## 2020-10-15 DIAGNOSIS — J309 Allergic rhinitis, unspecified: Secondary | ICD-10-CM

## 2020-10-15 DIAGNOSIS — E669 Obesity, unspecified: Secondary | ICD-10-CM | POA: Diagnosis not present

## 2020-10-15 DIAGNOSIS — E1159 Type 2 diabetes mellitus with other circulatory complications: Secondary | ICD-10-CM

## 2020-10-15 DIAGNOSIS — I152 Hypertension secondary to endocrine disorders: Secondary | ICD-10-CM

## 2020-10-15 DIAGNOSIS — Z6834 Body mass index (BMI) 34.0-34.9, adult: Secondary | ICD-10-CM | POA: Diagnosis not present

## 2020-10-15 MED ORDER — IPRATROPIUM BROMIDE 0.03 % NA SOLN: NASAL | 0 refills | Status: DC

## 2020-10-15 NOTE — Progress Notes (Signed)
Chief Complaint:   April Matthews is here to discuss her progress with her obesity treatment plan along with follow-up of her obesity related diagnoses. April Matthews is on the Category 1 Plan and states she is following her eating plan approximately 80% of the time. April Matthews states she is walking 5,000 steps daily 7 times per week.  Today's visit was #: 9 Starting weight: 205 lbs Starting date: 05/09/2020 Today's weight: 192 lbs Today's date: 10/15/2020 Total lbs lost to date: 13 Total lbs lost since last in-office visit: 12  Interim History: April Matthews is down 2 lbs. She did better, down 4 lbs on 10/05/2020, but had more drinks last week. She is focusing on eating more protein.  Subjective:   Hypertension associated with type 2 diabetes mellitus (April Matthews). April Matthews is taking Cozaar and Toprol XL. Blood pressure is reasonably well controlled.  BP Readings from Last 3 Encounters:  10/15/20 (!) 148/83  09/18/20 (!) 143/85  09/05/20 131/82   Lab Results  Component Value Date   CREATININE 1.07 (H) 05/09/2020   CREATININE 1.16 (H) 06/02/2019   CREATININE 1.13 (H) 09/15/2018   Allergic rhinitis, unspecified seasonality, unspecified trigger. April Matthews endorses signs and symptoms of nasal drainage.  Assessment/Plan:   Hypertension associated with type 2 diabetes mellitus ([place]). April Matthews is working on healthy weight loss and exercise to improve blood pressure control. We will watch for signs of hypotension as she continues her lifestyle modifications. She will continue her medications as directed.   Allergic rhinitis, unspecified seasonality, unspecified trigger. April Matthews was given a prescription for ipratropium (ATROVENT) 0.03 % nasal spray 2 sprays each nostril every 12 hours, 30 mL with 0 refills.  Class 1 obesity with serious comorbidity and body mass index (BMI) of 34.0 to 34.9 in adult, unspecified obesity type.  April Matthews is currently in the action stage of change. As such, her goal is to  continue with weight loss efforts. She has agreed to the Category 1 Plan and will journal 1300 calories and 80 grams of protein with intermittent fasting over an 8-hour window of time.   She will work on meal planning and intentional eating.   We discussed journaling and provided her with the Protein Sheet.  Exercise goals: April Matthews will continue her steps and increase over time.  Behavioral modification strategies: increasing lean protein intake, decreasing simple carbohydrates, increasing vegetables, increasing water intake, decreasing eating out, no skipping meals, meal planning and cooking strategies, keeping healthy foods in the home and planning for success.  April Matthews has agreed to follow-up with our clinic in 2-3 weeks. She was informed of the importance of frequent follow-up visits to maximize her success with intensive lifestyle modifications for her multiple health conditions.   Objective:   Blood pressure (!) 148/83, pulse 63, temperature 97.9 F (36.6 C), height 5\' 3"  (1.6 m), weight 192 lb (87.1 kg), SpO2 97 %. Body mass index is 34.01 kg/m.  General: Cooperative, alert, well developed, in no acute distress. HEENT: Conjunctivae and lids unremarkable. Cardiovascular: Regular rhythm.  Lungs: Normal work of breathing. Neurologic: No focal deficits.   Lab Results  Component Value Date   CREATININE 1.07 (H) 05/09/2020   BUN 23 05/09/2020   NA 141 05/09/2020   K 4.9 05/09/2020   CL 103 05/09/2020   CO2 24 05/09/2020   Lab Results  Component Value Date   ALT 14 05/09/2020   AST 14 05/09/2020   ALKPHOS 89 05/09/2020   BILITOT 0.4 05/09/2020   Lab Results  Component  Value Date   HGBA1C 6.8 (A) 06/11/2020   HGBA1C 6.7 (A) 02/01/2020   HGBA1C 6.2 (A) 06/02/2019   HGBA1C 6.2 (A) 09/15/2018   HGBA1C 6.5 02/25/2018   Lab Results  Component Value Date   INSULIN 25.5 (H) 05/09/2020   Lab Results  Component Value Date   TSH 5.550 (H) 05/09/2020   Lab Results   Component Value Date   CHOL 130 05/09/2020   HDL 47 05/09/2020   LDLCALC 57 05/09/2020   TRIG 156 (H) 05/09/2020   CHOLHDL 2.5 06/02/2019   Lab Results  Component Value Date   WBC 8.0 06/02/2019   HGB 13.6 06/02/2019   HCT 40.6 06/02/2019   MCV 94 06/02/2019   PLT 346 06/02/2019   No results found for: IRON, TIBC, FERRITIN  Obesity Behavioral Intervention:   Approximately 15 minutes were spent on the discussion below.  ASK: We discussed the diagnosis of obesity with April Matthews today and April Matthews agreed to give [place] permission to discuss obesity behavioral modification therapy today.  ASSESS: April Matthews has the diagnosis of obesity and her BMI today is 34.1. April Matthews is in the action stage of change.   ADVISE: April Matthews was educated on the multiple health risks of obesity as well as the benefit of weight loss to improve her health. She was advised of the need for long term treatment and the importance of lifestyle modifications to improve her current health and to decrease her risk of future health problems.  AGREE: Multiple dietary modification options and treatment options were discussed and April Matthews agreed to follow the recommendations documented in the above note.  ARRANGE: April Matthews was educated on the importance of frequent visits to treat obesity as outlined per CMS and USPSTF guidelines and agreed to schedule her next follow up appointment today.  Attestation Statements:   Reviewed by clinician on day of visit: allergies, medications, problem list, medical history, surgical history, family history, social history, and previous encounter notes.  [name], am acting as Location manager for [employer], DO   I have reviewed the above documentation for accuracy and completeness, and I agree with the above. Jearld Lesch, DO

## 2020-10-16 DIAGNOSIS — Z23 Encounter for immunization: Secondary | ICD-10-CM | POA: Diagnosis not present

## 2020-10-22 ENCOUNTER — Telehealth (INDEPENDENT_AMBULATORY_CARE_PROVIDER_SITE_OTHER): Payer: Medicare Other | Admitting: Family Medicine

## 2020-10-22 ENCOUNTER — Encounter: Payer: Self-pay | Admitting: Family Medicine

## 2020-10-22 VITALS — HR 79 | Temp 98.4°F | Ht 63.0 in | Wt 190.0 lb

## 2020-10-22 DIAGNOSIS — U071 COVID-19: Secondary | ICD-10-CM

## 2020-10-22 NOTE — Patient Instructions (Signed)
Stay well hydrated. Continue the nasal sprays to help with runny nose, postnasal drainage. If your cough worsens, you can take guaifenesin (ie Mucinex, an expectorant) and dextromethorphan (the DM version of mucinex, or separate Delsym syrup) which is a cough suppressant.  Contact us or seek care if you develop worsening cough, shortness of breath, pain with breathing, or other new/concerning symptoms.  As of today, the CDC shortened the isolation to 5 days if you're completely asymptomatic by then (with continued mask-wearing for another 5 days).  I hope your husband remains well, and that your symptoms continue to improve rapidly.  Wishing you have a happy and HEALTHY new year!

## 2020-10-22 NOTE — Progress Notes (Signed)
Start time: 2:05  Unable to connect--needed to do update operating system on phone.   Re-started at 4:25 End time: 4:57   Virtual Visit via Video Note  I connected with April Matthews on 10/22/20 by a video enabled telemedicine application and verified that I am speaking with the correct person using two identifiers.  Location: Patient: home Provider: office   I discussed the limitations of evaluation and management by telemedicine and the availability of in person appointments. The patient expressed understanding and agreed to proceed.  History of Present Illness:  Chief Complaint  Patient presents with  . Covid Positive    VIRTUAL tested positive home test 10/20/20, symptoms started 10/19/20- temp is now normal, was 100. Has cough but mostly better now with Flonase. Really wanted to discuss what to do now as well as monoclonal antibodies.    Started with runny nose on 12/23 or 12/24, thought it was allergies, as this had bothered her much of the Fall. She had a slight cough. Felt similar to her fall allergies.   She got her booster just a few days before her symptoms began. She has been home since 12/16.  Has gone to grocery store and church (when pretty empty), no other gathering, no know sick contacts. Temp spiked up to 100.3 on Christmas day, she was feeling tired.  She did home COVID test that her daughter brought, and it was +. She had sweats, woke up drenched, and has low-grade fever 99-100 for the last couple of days.  She reports today her temperature has been normal, T97.6. She slept a lot over the last 3 days. She is now feeling better. Normal appetite, no nausea.  Pulse oximetry has been 95% the first day, 97-98% the last 2 days.  She started flonase this morning, which she thinks helped with her cough. She has been taking the atrovent BID, which has helped with runny nose. Also took a baby aspirin.  Hasn't really been coughing.  Gets Stelara every 3 months. She was  unable to get it in November due to cost. Hasn't had any flare or problem since missed dose.  PMH, PSH, SH reviewed  Outpatient Encounter Medications as of 10/22/2020  Medication Sig Note  . aspirin EC 81 MG tablet Take 81 mg by mouth daily. Swallow whole.   Marland Kitchen atorvastatin (LIPITOR) 20 MG tablet TAKE 1 TABLET(20 MG) BY MOUTH DAILY   . fluticasone (FLONASE) 50 MCG/ACT nasal spray Place 1 spray into both nostrils daily.   Marland Kitchen losartan (COZAAR) 50 MG tablet Will take 1 daily   . metFORMIN (GLUCOPHAGE-XR) 500 MG 24 hr tablet TAKE 3 TABLETS BY MOUTH WITH BREAKFAST   . metoprolol succinate (TOPROL-XL) 25 MG 24 hr tablet TAKE 1 TABLET(25 MG) BY MOUTH DAILY   . Vitamin D, Ergocalciferol, (DRISDOL) 1.25 MG (50000 UNIT) CAPS capsule Take 1 capsule (50,000 Units total) by mouth every 7 (seven) days.   Marland Kitchen ipratropium (ATROVENT) 0.03 % nasal spray USE 2 SPRAYS IN EACH NOSTRIL EVERY 12 HOURS (Patient not taking: Reported on 10/22/2020)   . simethicone (MYLICON) 125 MG chewable tablet Chew 125 mg by mouth every 6 (six) hours as needed for flatulence. (Patient not taking: Reported on 10/22/2020)   . ustekinumab (STELARA) 45 MG/0.5ML SOSY injection Inject 45 mg into the skin. (Patient not taking: Reported on 10/22/2020) 02/25/2018: Every 3 months   No facility-administered encounter medications on file as of 10/22/2020.   Allergies  Allergen Reactions  . Sesame Oil Anaphylaxis  Sesame seed/oil  . Ace Inhibitors Cough  . Bee Venom   . Latex Rash   ROS: Fever, URI symptoms per HPI.  Fatigue. No chest pain, shortness of breath No nausea, vomiting or diarrhea. Slight decrease in smell (was worse on Christmas day) See HPI  Immunization History  Administered Date(s) Administered  . Influenza Split 09/08/2012, 08/10/2014, 08/21/2015  . Influenza, High Dose Seasonal PF 07/10/2016, 07/13/2017, 07/20/2018, 07/11/2020  . PFIZER SARS-COV-2 Vaccination 11/17/2019, 12/13/2019, 10/16/2020  . PPD Test 09/08/2012   . Pneumococcal Conjugate-13 04/20/2014  . Pneumococcal Polysaccharide-23 04/23/2015  . Tdap 06/19/2011      Observations/Objective:  Pulse 79   Temp 98.4 F (36.9 C) (Temporal)   Ht 5\' 3"  (1.6 m)   Wt 190 lb (86.2 kg)   SpO2 97%   BMI 33.66 kg/m   Alert and oriented.  She is well-appearing.  Her voice is slightly scratchy.  No coughing or throat-clearing during the visit. Cranial nerves are grossly intact. Exam is limited due to virtual nature of the visit.   Assessment and Plan:  COVID-19 virus infection - symptoms started 3d ago, improving. Not meeting criteria for MAb since changed due to shortage. Sx improving, cont supportive care. Reviewed guidelines   Mucinex plain or DM, only if needed   Follow Up Instructions:    I discussed the assessment and treatment plan with the patient. The patient was provided an opportunity to ask questions and all were answered. The patient agreed with the plan and demonstrated an understanding of the instructions.   The patient was advised to call back or seek an in-person evaluation if the symptoms worsen or if the condition fails to improve as anticipated.  I spent 32 minutes dedicated to the care of this patient, including pre-visit review of records, face to face time, post-visit ordering of testing and documentation.    , MD

## 2020-11-07 ENCOUNTER — Ambulatory Visit (INDEPENDENT_AMBULATORY_CARE_PROVIDER_SITE_OTHER): Payer: Medicare Other | Admitting: Bariatrics

## 2020-11-07 ENCOUNTER — Encounter (INDEPENDENT_AMBULATORY_CARE_PROVIDER_SITE_OTHER): Payer: Self-pay

## 2020-11-12 ENCOUNTER — Telehealth (INDEPENDENT_AMBULATORY_CARE_PROVIDER_SITE_OTHER): Payer: Medicare Other | Admitting: Adult Health

## 2020-11-12 ENCOUNTER — Encounter (INDEPENDENT_AMBULATORY_CARE_PROVIDER_SITE_OTHER): Payer: Self-pay | Admitting: Adult Health

## 2020-11-12 ENCOUNTER — Other Ambulatory Visit: Payer: Self-pay

## 2020-11-12 DIAGNOSIS — E1169 Type 2 diabetes mellitus with other specified complication: Secondary | ICD-10-CM

## 2020-11-12 DIAGNOSIS — E669 Obesity, unspecified: Secondary | ICD-10-CM | POA: Diagnosis not present

## 2020-11-12 DIAGNOSIS — E559 Vitamin D deficiency, unspecified: Secondary | ICD-10-CM

## 2020-11-12 DIAGNOSIS — L4050 Arthropathic psoriasis, unspecified: Secondary | ICD-10-CM

## 2020-11-12 DIAGNOSIS — Z6834 Body mass index (BMI) 34.0-34.9, adult: Secondary | ICD-10-CM

## 2020-11-12 MED ORDER — VITAMIN D (ERGOCALCIFEROL) 1.25 MG (50000 UNIT) PO CAPS: 50000.0000 [IU] | ORAL_CAPSULE | ORAL | 0 refills | Status: DC

## 2020-11-14 NOTE — Progress Notes (Signed)
TeleHealth Visit:  Due to the COVID-19 pandemic, this visit was completed with telemedicine (audio/video) technology to reduce patient and provider exposure as well as to preserve personal protective equipment.   April Matthews has verbally consented to this TeleHealth visit. The patient is located at home, the provider is located at the [facility] office. The participants in this visit include the listed provider and patient. The visit was conducted today via video.   Chief Complaint: OBESITY April Matthews is here to discuss her progress with her obesity treatment plan along with follow-up of her obesity related diagnoses. April Matthews is on the Category 1 Plan within in 16/8 intermittent fasting period and states she is following her eating plan approximately 40% of the time. April Matthews states she is exercising 0 minutes 0 times per week.  Today's visit was #: 10 Starting weight: 205 lbs Starting date: 05/09/2020  Interim History: April Matthews was COVID-19 positive during the Christmas holiday. She reports mild symptom of lingering fatigue. She is fully vaccinated and boosted with Pfizer as of 10/17/2020. Her ultimate goal is to reduce maintenance medications.   Subjective:   1. Psoriatic arthritis ([place]) Cherith was unable to receive Stelara injection in Nov 2021. She receives Stelara every 3 months.. Patient reports increased bilateral foot pain. She has been treating it with topical Voltaren ointment.  2. Vitamin D deficiency Mahima's Vitamin D level was 32.7 on 05/09/2020. She is currently taking prescription vitamin D 50,000 IU each week. She denies nausea, vomiting or muscle weakness. Vit D level is below goal of 50.  Ref. Range 05/09/2020 14:38  Vitamin D, 25-Hydroxy Latest Ref Range: 30.0 - 100.0 ng/mL 32.7   3. Type 2 diabetes mellitus with obesity ([place]) 06/11/2020 A1c 6.8, which is at goal. November is on Metformin XR 500 mg 3 tablets with breakfast. She denies GI upset. She does not check  ambulatory blood glucose at home.   Lab Results  Component Value Date   HGBA1C 6.8 (A) 06/11/2020   HGBA1C 6.7 (A) 02/01/2020   HGBA1C 6.2 (A) 06/02/2019   Lab Results  Component Value Date   MICROALBUR 0.8 11/27/2016   LDLCALC 57 05/09/2020   CREATININE 1.07 (H) 05/09/2020   Lab Results  Component Value Date   INSULIN 25.5 (H) 05/09/2020    Assessment/Plan:   1. Psoriatic arthritis ([place]) Follow up with rheumatologist to resume Stelara injection.   2. Vitamin D deficiency Low Vitamin D level contributes to fatigue and are associated with obesity, breast, and colon cancer. She agrees to continue to take prescription Vitamin D @50 ,000 IU every week and will follow-up for routine testing of Vitamin D, at least 2-3 times per year to avoid over-replacement. Refill Vit D.  - Vitamin D, Ergocalciferol, (DRISDOL) 1.25 MG (50000 UNIT) CAPS capsule; Take 1 capsule (50,000 Units total) by mouth every 7 (seven) days.  Dispense: 4 capsule; Refill: 0  3. Type 2 diabetes mellitus with obesity (H[facility]) Good blood sugar control is important to decrease the likelihood of diabetic complications such as nephropathy, neuropathy, limb loss, blindness, coronary artery disease, and death. Intensive lifestyle modification including diet, exercise and weight loss are the first line of treatment for diabetes. Continue Metformin as directed and Category 1 meal plan.  4. Class 1 obesity with serious comorbidity and body mass index (BMI) of 34.0 to 34.9 in adult, unspecified obesity type April Matthews is currently in the action stage of change. As such, her goal is to continue with weight loss efforts. She has agreed  to the Category 1 Plan and 16/8 intermittent fasting.   Exercise goals: Increase daily walking.  Behavioral modification strategies: increasing lean protein intake, decreasing simple carbohydrates, meal planning and cooking strategies, better snacking choices and planning for success.  April Matthews has agreed  to follow-up with our clinic in 2 weeks. She was informed of the importance of frequent follow-up visits to maximize her success with intensive lifestyle modifications for her multiple health conditions.  Objective:   VITALS: Per patient if applicable, see vitals. GENERAL: Alert and in no acute distress. CARDIOPULMONARY: No increased WOB. Speaking in clear sentences.  PSYCH: Pleasant and cooperative. Speech normal rate and rhythm. Affect is appropriate. Insight and judgement are appropriate. Attention is focused, linear, and appropriate.  NEURO: Oriented as arrived to appointment on time with no prompting.   Lab Results  Component Value Date   CREATININE 1.07 (H) 05/09/2020   BUN 23 05/09/2020   NA 141 05/09/2020   K 4.9 05/09/2020   CL 103 05/09/2020   CO2 24 05/09/2020   Lab Results  Component Value Date   ALT 14 05/09/2020   AST 14 05/09/2020   ALKPHOS 89 05/09/2020   BILITOT 0.4 05/09/2020   Lab Results  Component Value Date   HGBA1C 6.8 (A) 06/11/2020   HGBA1C 6.7 (A) 02/01/2020   HGBA1C 6.2 (A) 06/02/2019   HGBA1C 6.2 (A) 09/15/2018   HGBA1C 6.5 02/25/2018   Lab Results  Component Value Date   INSULIN 25.5 (H) 05/09/2020   Lab Results  Component Value Date   TSH 5.550 (H) 05/09/2020   Lab Results  Component Value Date   CHOL 130 05/09/2020   HDL 47 05/09/2020   LDLCALC 57 05/09/2020   TRIG 156 (H) 05/09/2020   CHOLHDL 2.5 06/02/2019   Lab Results  Component Value Date   WBC 8.0 06/02/2019   HGB 13.6 06/02/2019   HCT 40.6 06/02/2019   MCV 94 06/02/2019   PLT 346 06/02/2019   No results found for: IRON, TIBC, FERRITIN  Attestation Statements:   Reviewed by clinician on day of visit: allergies, medications, problem list, medical history, surgical history, family history, social history, and previous encounter notes.  Time spent on visit including pre-visit chart review and post-visit charting and care was 32 minutes.   Coral Ceo, am acting  as Location manager for Mina Marble, NP.  I have reviewed the above documentation for accuracy and completeness, and I agree with the above. - Demere Dotzler d. Rosemae Mcquown. NP-C

## 2020-11-27 ENCOUNTER — Other Ambulatory Visit (INDEPENDENT_AMBULATORY_CARE_PROVIDER_SITE_OTHER): Payer: Self-pay | Admitting: Adult Health

## 2020-11-27 ENCOUNTER — Other Ambulatory Visit (INDEPENDENT_AMBULATORY_CARE_PROVIDER_SITE_OTHER): Payer: Self-pay | Admitting: Bariatrics

## 2020-11-27 DIAGNOSIS — J309 Allergic rhinitis, unspecified: Secondary | ICD-10-CM

## 2020-11-27 NOTE — Telephone Encounter (Signed)
Refill request

## 2020-11-27 NOTE — Telephone Encounter (Signed)
Last OV with Katy 

## 2020-11-29 DIAGNOSIS — L4050 Arthropathic psoriasis, unspecified: Secondary | ICD-10-CM | POA: Diagnosis not present

## 2020-11-29 DIAGNOSIS — Z79899 Other long term (current) drug therapy: Secondary | ICD-10-CM | POA: Diagnosis not present

## 2020-12-05 ENCOUNTER — Other Ambulatory Visit: Payer: Self-pay

## 2020-12-05 ENCOUNTER — Ambulatory Visit (INDEPENDENT_AMBULATORY_CARE_PROVIDER_SITE_OTHER): Payer: Medicare Other | Admitting: Bariatrics

## 2020-12-05 VITALS — BP 145/84 | HR 78 | Temp 98.6°F | Ht 63.0 in | Wt 194.0 lb

## 2020-12-05 DIAGNOSIS — I152 Hypertension secondary to endocrine disorders: Secondary | ICD-10-CM | POA: Diagnosis not present

## 2020-12-05 DIAGNOSIS — E1159 Type 2 diabetes mellitus with other circulatory complications: Secondary | ICD-10-CM | POA: Diagnosis not present

## 2020-12-05 DIAGNOSIS — E7800 Pure hypercholesterolemia, unspecified: Secondary | ICD-10-CM | POA: Diagnosis not present

## 2020-12-05 DIAGNOSIS — E6609 Other obesity due to excess calories: Secondary | ICD-10-CM | POA: Diagnosis not present

## 2020-12-05 DIAGNOSIS — E559 Vitamin D deficiency, unspecified: Secondary | ICD-10-CM

## 2020-12-05 DIAGNOSIS — Z6834 Body mass index (BMI) 34.0-34.9, adult: Secondary | ICD-10-CM

## 2020-12-05 DIAGNOSIS — E669 Obesity, unspecified: Secondary | ICD-10-CM | POA: Diagnosis not present

## 2020-12-05 MED ORDER — LOSARTAN POTASSIUM 50 MG PO TABS: ORAL_TABLET | ORAL | 0 refills | Status: DC

## 2020-12-05 MED ORDER — VITAMIN D (ERGOCALCIFEROL) 1.25 MG (50000 UNIT) PO CAPS: 50000.0000 [IU] | ORAL_CAPSULE | ORAL | 0 refills | Status: DC

## 2020-12-12 NOTE — Progress Notes (Signed)
Chief Complaint:   OBESITY April Matthews is here to discuss her progress with her obesity treatment plan along with follow-up of her obesity related diagnoses. April Matthews is on the Category 1 Plan and states she is following her eating plan approximately 60% of the time. April Matthews states she is not exercising regularly at this time.  Today's visit was #: 11 Starting weight: 205 lbs Starting date: 05/09/2020 Today's weight: 194 lbs Today's date: 12/05/2020 Total lbs lost to date: 9 lbs Total lbs lost since last in-office visit: 0  Interim History: April Matthews is up 2 pounds since her last visit.  She had a flare of her psoriatic arthritis and had to take prednisone.  Subjective:   1. Pure hypercholesterolemia April Matthews has hyperlipidemia and has been trying to improve her cholesterol levels with intensive lifestyle modification including a low saturated fat diet, exercise and weight loss. She denies any chest pain, claudication or myalgias.  She is taking Lipitor 20 mg daily.  Lab Results  Component Value Date   ALT 14 05/09/2020   AST 14 05/09/2020   ALKPHOS 89 05/09/2020   BILITOT 0.4 05/09/2020   Lab Results  Component Value Date   CHOL 130 05/09/2020   HDL 47 05/09/2020   LDLCALC 57 05/09/2020   TRIG 156 (H) 05/09/2020   CHOLHDL 2.5 06/02/2019   2. Hypertension associated with type 2 diabetes mellitus ([place]) Review: taking medications as instructed, no medication side effects noted, no chest pain on exertion, no dyspnea on exertion, no swelling of ankles.  She is taking Cozaar 50 mg daily and Toprol-XL 25 mg daily.  BP Readings from Last 3 Encounters:  12/05/20 (!) 145/84  10/15/20 (!) 148/83  09/18/20 (!) 143/85   3. Vitamin D deficiency April Matthews's Vitamin D level was 32.7 on 05/09/2020. She is currently taking prescription vitamin D 50,000 IU each week. She denies nausea, vomiting or muscle weakness.  Assessment/Plan:   1. Pure hypercholesterolemia Cardiovascular risk and specific  lipid/LDL goals reviewed.  We discussed several lifestyle modifications today and April Matthews will continue to work on diet, exercise and weight loss efforts. Orders and follow up as documented in patient record.  Continue Lipitor.  Counseling Intensive lifestyle modifications are the first line treatment for this issue. . Dietary changes: Increase soluble fiber. Decrease simple carbohydrates. . Exercise changes: Moderate to vigorous-intensity aerobic activity 150 minutes per week if tolerated. . Lipid-lowering medications: see documented in medical record.  2. Hypertension associated with type 2 diabetes mellitus ([place]) April Matthews is working on healthy weight loss and exercise to improve blood pressure control. We will watch for signs of hypotension as she continues her lifestyle modifications.  Continue medications.  Will refill losartan today, as per below.  - Refill losartan (COZAAR) 50 MG tablet; Will take 1 daily  Dispense: 90 tablet; Refill: 0  3. Vitamin D deficiency Low Vitamin D level contributes to fatigue and are associated with obesity, breast, and colon cancer. She agrees to continue to take prescription Vitamin D @50 ,000 IU every week and will follow-up for routine testing of Vitamin D, at least 2-3 times per year to avoid over-replacement.  - Refill Vitamin D, Ergocalciferol, (DRISDOL) 1.25 MG (50000 UNIT) CAPS capsule; Take 1 capsule (50,000 Units total) by mouth every 7 (seven) days.  Dispense: 4 capsule; Refill: 0  4. Class 1 obesity with serious comorbidity and body mass index (BMI) of 34.0 to 34.9 in adult, unspecified obesity type  April Matthews is currently in the action stage of change. As  such, her goal is to continue with weight loss efforts. She has agreed to the Category 1 Plan.   She will work on staying adherent to the plan and increasing her protein intake.  Exercise goals: Older adults should follow the adult guidelines. When older adults cannot meet the adult guidelines, they  should be as physically active as their abilities and conditions will allow.  Older adults should do exercises that maintain or improve balance if they are at risk of falling.   Behavioral modification strategies: increasing lean protein intake, decreasing simple carbohydrates, increasing vegetables, increasing water intake, decreasing eating out, no skipping meals, meal planning and cooking strategies, keeping healthy foods in the home and planning for success.  April Matthews has agreed to follow-up with our clinic in 2-3 weeks, fasting. She was informed of the importance of frequent follow-up visits to maximize her success with intensive lifestyle modifications for her multiple health conditions.   Objective:   Blood pressure (!) 145/84, pulse 78, temperature 98.6 F (37 C), height 5\' 3"  (1.6 m), weight 194 lb (88 kg), SpO2 97 %. Body mass index is 34.37 kg/m.  General: Cooperative, alert, well developed, in no acute distress. HEENT: Conjunctivae and lids unremarkable. Cardiovascular: Regular rhythm.  Lungs: Normal work of breathing. Neurologic: No focal deficits.   Lab Results  Component Value Date   CREATININE 1.07 (H) 05/09/2020   BUN 23 05/09/2020   NA 141 05/09/2020   K 4.9 05/09/2020   CL 103 05/09/2020   CO2 24 05/09/2020   Lab Results  Component Value Date   ALT 14 05/09/2020   AST 14 05/09/2020   ALKPHOS 89 05/09/2020   BILITOT 0.4 05/09/2020   Lab Results  Component Value Date   HGBA1C 6.8 (A) 06/11/2020   HGBA1C 6.7 (A) 02/01/2020   HGBA1C 6.2 (A) 06/02/2019   HGBA1C 6.2 (A) 09/15/2018   HGBA1C 6.5 02/25/2018   Lab Results  Component Value Date   INSULIN 25.5 (H) 05/09/2020   Lab Results  Component Value Date   TSH 5.550 (H) 05/09/2020   Lab Results  Component Value Date   CHOL 130 05/09/2020   HDL 47 05/09/2020   LDLCALC 57 05/09/2020   TRIG 156 (H) 05/09/2020   CHOLHDL 2.5 06/02/2019   Lab Results  Component Value Date   WBC 8.0 06/02/2019   HGB  13.6 06/02/2019   HCT 40.6 06/02/2019   MCV 94 06/02/2019   PLT 346 06/02/2019   Obesity Behavioral Intervention:   Approximately 15 minutes were spent on the discussion below.  ASK: We discussed the diagnosis of obesity with April Matthews today and April Matthews agreed to give [place] permission to discuss obesity behavioral modification therapy today.  ASSESS: April Matthews has the diagnosis of obesity and her BMI today is 34.4. April Matthews is in the action stage of change.   ADVISE: April Matthews was educated on the multiple health risks of obesity as well as the benefit of weight loss to improve her health. She was advised of the need for long term treatment and the importance of lifestyle modifications to improve her current health and to decrease her risk of future health problems.  AGREE: Multiple dietary modification options and treatment options were discussed and April Matthews agreed to follow the recommendations documented in the above note.  ARRANGE: April Matthews was educated on the importance of frequent visits to treat obesity as outlined per CMS and USPSTF guidelines and agreed to schedule her next follow up appointment today.  Attestation Statements:   Reviewed by  clinician on day of visit: allergies, medications, problem list, medical history, surgical history, family history, social history, and previous encounter notes.  I, Water quality scientist, CMA, am acting as Location manager for [employer], DO  I have reviewed the above documentation for accuracy and completeness, and I agree with the above. -   Current Outpatient Medications  Medication Sig Dispense Refill  . aspirin EC 81 MG tablet Take 81 mg by mouth daily. Swallow whole.    Marland Kitchen atorvastatin (LIPITOR) 20 MG tablet TAKE 1 TABLET(20 MG) BY MOUTH DAILY 90 tablet 0  . fluticasone (FLONASE) 50 MCG/ACT nasal spray Place 1 spray into both nostrils daily.    Marland Kitchen ipratropium (ATROVENT) 0.03 % nasal spray USE 2 SPRAYS IN EACH NOSTRIL EVERY 12 HOURS 30 mL 0  . metFORMIN  (GLUCOPHAGE-XR) 500 MG 24 hr tablet TAKE 3 TABLETS BY MOUTH WITH BREAKFAST 270 tablet 1  . metoprolol succinate (TOPROL-XL) 25 MG 24 hr tablet TAKE 1 TABLET(25 MG) BY MOUTH DAILY 90 tablet 0  . simethicone (MYLICON) 817 MG chewable tablet Chew 125 mg by mouth every 6 (six) hours as needed for flatulence.    . ustekinumab (STELARA) 45 MG/0.5ML SOSY injection Inject 45 mg into the skin.    Marland Kitchen losartan (COZAAR) 50 MG tablet Will take 1 daily 90 tablet 0  . Vitamin D, Ergocalciferol, (DRISDOL) 1.25 MG (50000 UNIT) CAPS capsule Take 1 capsule (50,000 Units total) by mouth every 7 (seven) days. 4 capsule 0   No current facility-administered medications for this visit.

## 2020-12-13 ENCOUNTER — Encounter (INDEPENDENT_AMBULATORY_CARE_PROVIDER_SITE_OTHER): Payer: Self-pay | Admitting: Bariatrics

## 2020-12-28 DIAGNOSIS — L4050 Arthropathic psoriasis, unspecified: Secondary | ICD-10-CM | POA: Diagnosis not present

## 2021-01-01 ENCOUNTER — Other Ambulatory Visit: Payer: Self-pay | Admitting: Family Medicine

## 2021-01-01 DIAGNOSIS — I10 Essential (primary) hypertension: Secondary | ICD-10-CM

## 2021-01-01 DIAGNOSIS — E7800 Pure hypercholesterolemia, unspecified: Secondary | ICD-10-CM

## 2021-01-03 ENCOUNTER — Other Ambulatory Visit (INDEPENDENT_AMBULATORY_CARE_PROVIDER_SITE_OTHER): Payer: Self-pay | Admitting: Bariatrics

## 2021-01-03 DIAGNOSIS — E559 Vitamin D deficiency, unspecified: Secondary | ICD-10-CM

## 2021-01-07 ENCOUNTER — Encounter (INDEPENDENT_AMBULATORY_CARE_PROVIDER_SITE_OTHER): Payer: Self-pay | Admitting: Bariatrics

## 2021-01-07 ENCOUNTER — Ambulatory Visit (INDEPENDENT_AMBULATORY_CARE_PROVIDER_SITE_OTHER): Payer: Medicare Other | Admitting: Bariatrics

## 2021-01-07 ENCOUNTER — Other Ambulatory Visit: Payer: Self-pay

## 2021-01-07 VITALS — BP 146/84 | HR 75 | Temp 98.7°F | Ht 63.0 in | Wt 193.0 lb

## 2021-01-07 DIAGNOSIS — I152 Hypertension secondary to endocrine disorders: Secondary | ICD-10-CM

## 2021-01-07 DIAGNOSIS — E559 Vitamin D deficiency, unspecified: Secondary | ICD-10-CM | POA: Diagnosis not present

## 2021-01-07 DIAGNOSIS — Z6834 Body mass index (BMI) 34.0-34.9, adult: Secondary | ICD-10-CM | POA: Diagnosis not present

## 2021-01-07 DIAGNOSIS — E1159 Type 2 diabetes mellitus with other circulatory complications: Secondary | ICD-10-CM

## 2021-01-07 DIAGNOSIS — E6609 Other obesity due to excess calories: Secondary | ICD-10-CM

## 2021-01-07 MED ORDER — VITAMIN D (ERGOCALCIFEROL) 1.25 MG (50000 UNIT) PO CAPS
50000.0000 [IU] | ORAL_CAPSULE | ORAL | 0 refills | Status: DC
Start: 2021-01-07 — End: 2021-02-07

## 2021-01-08 ENCOUNTER — Encounter (INDEPENDENT_AMBULATORY_CARE_PROVIDER_SITE_OTHER): Payer: Self-pay | Admitting: Bariatrics

## 2021-01-08 NOTE — Progress Notes (Signed)
Chief Complaint:   OBESITY April Matthews is here to discuss her progress with her obesity treatment plan along with follow-up of her obesity related diagnoses. April Matthews is on the Category 1 Plan and states she is following her eating plan approximately 50% of the time. April Matthews states she is doing 0 minutes 0 times per week.  Today's visit was #: 12 Starting weight: 205 lbs Starting date: 05/09/2020 Today's weight: 193 lbs Today's date: 01/07/2021 Total lbs lost to date: 12 lbs Total lbs lost since last in-office visit: 1 lb  Interim History: April Matthews is drinking more water.  Subjective:   1. Vitamin D deficiency Pt denies nausea, vomiting, and muscle weakness.  2. Hypertension associated with type 2 diabetes mellitus ([place]) Pt's BP is reasonable well controlled.  BP Readings from Last 3 Encounters:  01/07/21 (!) 146/84  12/05/20 (!) 145/84  10/15/20 (!) 148/83    Assessment/Plan:   1. Vitamin D deficiency Low Vitamin D level contributes to fatigue and are associated with obesity, breast, and colon cancer. She agrees to continue to take prescription Vitamin D @50 ,000 IU every week and will follow-up for routine testing of Vitamin D, at least 2-3 times per year to avoid over-replacement.  - Vitamin D, Ergocalciferol, (DRISDOL) 1.25 MG (50000 UNIT) CAPS capsule; Take 1 capsule (50,000 Units total) by mouth every 7 (seven) days.  Dispense: 4 capsule; Refill: 0  2. Hypertension associated with type 2 diabetes mellitus ([place]) April Matthews is working on healthy weight loss and exercise to improve blood pressure control. We will watch for signs of hypotension as she continues her lifestyle modifications.  3. Class 1 obesity due to excess calories with serious comorbidity and body mass index (BMI) of 34.0 to 34.9 in adult April Matthews is currently in the action stage of change. As such, her goal is to continue with weight loss efforts. She has agreed to the Category 1 Plan.   Exercise goals: Started her  steps again  Behavioral modification strategies: increasing lean protein intake, decreasing simple carbohydrates, increasing vegetables, increasing water intake, decreasing eating out, no skipping meals, meal planning and cooking strategies, keeping healthy foods in the home and planning for success.  April Matthews has agreed to follow-up with our clinic in 2-3 weeks. She was informed of the importance of frequent follow-up visits to maximize her success with intensive lifestyle modifications for her multiple health conditions.   Objective:   Blood pressure (!) 146/84, pulse 75, temperature 98.7 F (37.1 C), height 5\' 3"  (1.6 m), weight 193 lb (87.5 kg), SpO2 93 %. Body mass index is 34.19 kg/m.  General: Cooperative, alert, well developed, in no acute distress. HEENT: Conjunctivae and lids unremarkable. Cardiovascular: Regular rhythm.  Lungs: Normal work of breathing. Neurologic: No focal deficits.   Lab Results  Component Value Date   CREATININE 1.07 (H) 05/09/2020   BUN 23 05/09/2020   NA 141 05/09/2020   K 4.9 05/09/2020   CL 103 05/09/2020   CO2 24 05/09/2020   Lab Results  Component Value Date   ALT 14 05/09/2020   AST 14 05/09/2020   ALKPHOS 89 05/09/2020   BILITOT 0.4 05/09/2020   Lab Results  Component Value Date   HGBA1C 6.8 (A) 06/11/2020   HGBA1C 6.7 (A) 02/01/2020   HGBA1C 6.2 (A) 06/02/2019   HGBA1C 6.2 (A) 09/15/2018   HGBA1C 6.5 02/25/2018   Lab Results  Component Value Date   INSULIN 25.5 (H) 05/09/2020   Lab Results  Component Value Date  TSH 5.550 (H) 05/09/2020   Lab Results  Component Value Date   CHOL 130 05/09/2020   HDL 47 05/09/2020   LDLCALC 57 05/09/2020   TRIG 156 (H) 05/09/2020   CHOLHDL 2.5 06/02/2019   Lab Results  Component Value Date   WBC 8.0 06/02/2019   HGB 13.6 06/02/2019   HCT 40.6 06/02/2019   MCV 94 06/02/2019   PLT 346 06/02/2019   No results found for: IRON, TIBC, FERRITIN  Obesity Behavioral Intervention:    Approximately 15 minutes were spent on the discussion below.  ASK: We discussed the diagnosis of obesity with Hetvi today and Doloras agreed to give [place] permission to discuss obesity behavioral modification therapy today.  ASSESS: Isabelly has the diagnosis of obesity and her BMI today is 34.3. Sanaz is in the action stage of change.   ADVISE: Anastasia was educated on the multiple health risks of obesity as well as the benefit of weight loss to improve her health. She was advised of the need for long term treatment and the importance of lifestyle modifications to improve her current health and to decrease her risk of future health problems.  AGREE: Multiple dietary modification options and treatment options were discussed and Deissy agreed to follow the recommendations documented in the above note.  ARRANGE: Marcela was educated on the importance of frequent visits to treat obesity as outlined per CMS and USPSTF guidelines and agreed to schedule her next follow up appointment today.  Attestation Statements:   Reviewed by clinician on day of visit: allergies, medications, problem list, medical history, surgical history, family history, social history, and previous encounter notes.  Coral Ceo, am acting as Location manager for [employer], DO.  I have reviewed the above documentation for accuracy and completeness, and I agree with the above. Jearld Lesch, DO

## 2021-01-25 ENCOUNTER — Other Ambulatory Visit (INDEPENDENT_AMBULATORY_CARE_PROVIDER_SITE_OTHER): Payer: Self-pay | Admitting: Adult Health

## 2021-01-25 DIAGNOSIS — J309 Allergic rhinitis, unspecified: Secondary | ICD-10-CM

## 2021-01-28 ENCOUNTER — Ambulatory Visit (INDEPENDENT_AMBULATORY_CARE_PROVIDER_SITE_OTHER): Payer: Medicare Other | Admitting: Bariatrics

## 2021-01-28 ENCOUNTER — Other Ambulatory Visit: Payer: Self-pay

## 2021-01-28 ENCOUNTER — Encounter (INDEPENDENT_AMBULATORY_CARE_PROVIDER_SITE_OTHER): Payer: Self-pay | Admitting: Bariatrics

## 2021-01-28 ENCOUNTER — Other Ambulatory Visit (INDEPENDENT_AMBULATORY_CARE_PROVIDER_SITE_OTHER): Payer: Self-pay | Admitting: Adult Health

## 2021-01-28 VITALS — BP 134/82 | HR 78 | Temp 97.9°F | Ht 63.0 in | Wt 195.0 lb

## 2021-01-28 DIAGNOSIS — Z6836 Body mass index (BMI) 36.0-36.9, adult: Secondary | ICD-10-CM | POA: Diagnosis not present

## 2021-01-28 DIAGNOSIS — E669 Obesity, unspecified: Secondary | ICD-10-CM

## 2021-01-28 DIAGNOSIS — E559 Vitamin D deficiency, unspecified: Secondary | ICD-10-CM

## 2021-01-28 DIAGNOSIS — E7800 Pure hypercholesterolemia, unspecified: Secondary | ICD-10-CM

## 2021-01-28 DIAGNOSIS — E1169 Type 2 diabetes mellitus with other specified complication: Secondary | ICD-10-CM | POA: Diagnosis not present

## 2021-01-28 DIAGNOSIS — J309 Allergic rhinitis, unspecified: Secondary | ICD-10-CM

## 2021-01-28 DIAGNOSIS — R7989 Other specified abnormal findings of blood chemistry: Secondary | ICD-10-CM

## 2021-01-29 LAB — COMPREHENSIVE METABOLIC PANEL
ALT: 13 IU/L (ref 0–32)
AST: 16 IU/L (ref 0–40)
Albumin/Globulin Ratio: 1.7 (ref 1.2–2.2)
Albumin: 4.5 g/dL (ref 3.7–4.7)
Alkaline Phosphatase: 69 IU/L (ref 44–121)
BUN/Creatinine Ratio: 20 (ref 12–28)
BUN: 23 mg/dL (ref 8–27)
Bilirubin Total: 0.4 mg/dL (ref 0.0–1.2)
CO2: 23 mmol/L (ref 20–29)
Calcium: 10 mg/dL (ref 8.7–10.3)
Chloride: 103 mmol/L (ref 96–106)
Creatinine, Ser: 1.13 mg/dL — ABNORMAL HIGH (ref 0.57–1.00)
Globulin, Total: 2.7 g/dL (ref 1.5–4.5)
Glucose: 122 mg/dL — ABNORMAL HIGH (ref 65–99)
Potassium: 4.7 mmol/L (ref 3.5–5.2)
Sodium: 141 mmol/L (ref 134–144)
Total Protein: 7.2 g/dL (ref 6.0–8.5)
eGFR: 51 mL/min/{1.73_m2} — ABNORMAL LOW (ref >59)

## 2021-01-29 LAB — LIPID PANEL WITH LDL/HDL RATIO
Cholesterol, Total: 129 mg/dL (ref 100–199)
HDL: 59 mg/dL (ref >39)
LDL Chol Calc (NIH): 42 mg/dL (ref 0–99)
LDL/HDL Ratio: 0.7 ratio (ref 0.0–3.2)
Triglycerides: 171 mg/dL — ABNORMAL HIGH (ref 0–149)
VLDL Cholesterol Cal: 28 mg/dL (ref 5–40)

## 2021-01-29 LAB — INSULIN, RANDOM: INSULIN: 14.9 u[IU]/mL (ref 2.6–24.9)

## 2021-01-29 LAB — TSH+T4F+T3FREE
Free T4: 1.12 ng/dL (ref 0.82–1.77)
T3, Free: 3.3 pg/mL (ref 2.0–4.4)
TSH: 5.21 u[IU]/mL — ABNORMAL HIGH (ref 0.450–4.500)

## 2021-01-29 LAB — VITAMIN D 25 HYDROXY (VIT D DEFICIENCY, FRACTURES): Vit D, 25-Hydroxy: 58.5 ng/mL (ref 30.0–100.0)

## 2021-01-29 LAB — HEMOGLOBIN A1C
Est. average glucose Bld gHb Est-mCnc: 140 mg/dL
Hgb A1c MFr Bld: 6.5 % — ABNORMAL HIGH (ref 4.8–5.6)

## 2021-01-29 NOTE — Progress Notes (Signed)
Chief Complaint:   OBESITY April Matthews is here to discuss her progress with her obesity treatment plan along with follow-up of her obesity related diagnoses. [name] is on the Category 1 Plan and states she is following her eating plan approximately 80% of the time. [name] states she is not exercising regularly.  Today's visit was #: 52 Starting weight: 205 lbs Starting date: 05/09/2020 Today's weight: 195 lbs Today's date: 01/28/2021 Total lbs lost to date: 10 lbs Total lbs lost since last in-office visit: 0  Interim History: April Matthews is up 2 pounds and doing well overall.  Subjective:   1. Type 2 diabetes mellitus with obesity ([place]) April Matthews is taking metformin.  Lab Results  Component Value Date   HGBA1C 6.8 (A) 06/11/2020   HGBA1C 6.7 (A) 02/01/2020   Lab Results  Component Value Date   MICROALBUR 0.8 11/27/2016   Lab Results  Component Value Date   INSULIN 25.5 (H) 05/09/2020   2. Pure hypercholesterolemia April Matthews has hypercholesterolemia and has been trying to improve her cholesterol levels with intensive lifestyle modification including a low saturated fat diet, exercise and weight loss. She denies any chest pain, claudication or myalgias.  She is taking Lipitor.  Lab Results  Component Value Date   CHOLHDL 2.5 06/02/2019   3. Vitamin D deficiency April Matthews's Vitamin D level was 32.7 on 05/09/2020. She is currently taking prescription vitamin D 50,000 IU each week. She denies nausea, vomiting or muscle weakness.  4. Elevated TSH She is not on any medication for this.  Last TSH 5.550.  Assessment/Plan:   1. Type 2 diabetes mellitus with obesity (H[facility]) Good blood sugar control is important to decrease the likelihood of diabetic complications such as nephropathy, neuropathy, limb loss, blindness, coronary artery disease, and death. Intensive lifestyle modification including diet, exercise and weight loss are the first line of treatment for diabetes.  Continue metformin.   Will check A1c and insulin today.  - Insulin, random - Hemoglobin A1c - Comprehensive metabolic panel  2. Pure hypercholesterolemia Cardiovascular risk and specific lipid/LDL goals reviewed.  We discussed several lifestyle modifications today and [name] will continue to work on diet, exercise and weight loss efforts. Orders and follow up as documented in patient record.  Continue Lipitor.  Will check lipid panel today.  Counseling Intensive lifestyle modifications are the first line treatment for this issue. . Dietary changes: Increase soluble fiber. Decrease simple carbohydrates. . Exercise changes: Moderate to vigorous-intensity aerobic activity 150 minutes per week if tolerated. . Lipid-lowering medications: see documented in medical record.  - Lipid Panel With LDL/HDL Ratio  3. Vitamin D deficiency Low Vitamin D level contributes to fatigue and are associated with obesity, breast, and colon cancer. She agrees to continue to take prescription Vitamin D @50 ,000 IU every week.  Will check vitamin D level today.  - VITAMIN D 25 Hydroxy (Vit-D Deficiency, Fractures)  4. Elevated TSH Will check thyroid panel today, as per below.   - TSH+T4F+T3Free  5. Obesity, current BMI 34.7  April Matthews is currently in the action stage of change. As such, her goal is to continue with weight loss efforts. She has agreed to the Category 1 Plan.   She will work on meal planning and mindful eating.  Exercise goals: Older adults should follow the adult guidelines. When older adults cannot meet the adult guidelines, they should be as physically active as their abilities and conditions will allow.  Older adults should do exercises that maintain or improve balance if they  are at risk of falling.   Behavioral modification strategies: increasing lean protein intake, decreasing simple carbohydrates, increasing vegetables, increasing water intake, decreasing eating out, no skipping meals, meal planning and cooking  strategies, keeping healthy foods in the home and planning for success.  April Matthews has agreed to follow-up with our clinic in 2 weeks, fasting. She was informed of the importance of frequent follow-up visits to maximize her success with intensive lifestyle modifications for her multiple health conditions.   April Matthews was informed we would discuss her lab results at her next visit unless there is a critical issue that needs to be addressed sooner. April Matthews agreed to keep her next visit at the agreed upon time to discuss these results.  Objective:   Blood pressure 134/82, pulse 78, temperature 97.9 F (36.6 C), height 5\' 3"  (1.6 m), weight 195 lb (88.5 kg), SpO2 97 %. Body mass index is 34.54 kg/m.  General: Cooperative, alert, well developed, in no acute distress. HEENT: Conjunctivae and lids unremarkable. Cardiovascular: Regular rhythm.  Lungs: Normal work of breathing. Neurologic: No focal deficits.   Lab Results  Component Value Date   HGBA1C 6.8 (A) 06/11/2020   HGBA1C 6.7 (A) 02/01/2020   HGBA1C 6.2 (A) 06/02/2019   HGBA1C 6.2 (A) 09/15/2018   Lab Results  Component Value Date   INSULIN 25.5 (H) 05/09/2020   Lab Results  Component Value Date   CHOLHDL 2.5 06/02/2019   Lab Results  Component Value Date   WBC 8.0 06/02/2019   HGB 13.6 06/02/2019   HCT 40.6 06/02/2019   MCV 94 06/02/2019   PLT 346 06/02/2019   Obesity Behavioral Intervention:   Approximately 15 minutes were spent on the discussion below.  ASK: We discussed the diagnosis of obesity with April Matthews today and April Matthews agreed to give [place] permission to discuss obesity behavioral modification therapy today.  ASSESS: April Matthews has the diagnosis of obesity and her BMI today is 34.7. April Matthews is in the action stage of change.   ADVISE: April Matthews was educated on the multiple health risks of obesity as well as the benefit of weight loss to improve her health. She was advised of the need for long term treatment and the importance  of lifestyle modifications to improve her current health and to decrease her risk of future health problems.  AGREE: Multiple dietary modification options and treatment options were discussed and April Matthews agreed to follow the recommendations documented in the above note.  ARRANGE: Aysiah was educated on the importance of frequent visits to treat obesity as outlined per CMS and USPSTF guidelines and agreed to schedule her next follow up appointment today.  Attestation Statements:   Reviewed by clinician on day of visit: allergies, medications, problem list, medical history, surgical history, family history, social history, and previous encounter notes.  I, Water quality scientist, CMA, am acting as Location manager for [employer], DO  I have reviewed the above documentation for accuracy and completeness, and I agree with the above. Jearld Lesch, DO

## 2021-01-30 ENCOUNTER — Encounter (INDEPENDENT_AMBULATORY_CARE_PROVIDER_SITE_OTHER): Payer: Self-pay | Admitting: Bariatrics

## 2021-02-05 ENCOUNTER — Other Ambulatory Visit (INDEPENDENT_AMBULATORY_CARE_PROVIDER_SITE_OTHER): Payer: Self-pay | Admitting: Bariatrics

## 2021-02-05 DIAGNOSIS — E559 Vitamin D deficiency, unspecified: Secondary | ICD-10-CM

## 2021-02-07 ENCOUNTER — Other Ambulatory Visit (INDEPENDENT_AMBULATORY_CARE_PROVIDER_SITE_OTHER): Payer: Self-pay | Admitting: Bariatrics

## 2021-02-07 DIAGNOSIS — E559 Vitamin D deficiency, unspecified: Secondary | ICD-10-CM

## 2021-02-07 NOTE — Telephone Encounter (Signed)
Would you like to refill or wait until pt's upcoming appt?

## 2021-02-13 DIAGNOSIS — E119 Type 2 diabetes mellitus without complications: Secondary | ICD-10-CM | POA: Diagnosis not present

## 2021-02-13 DIAGNOSIS — H25813 Combined forms of age-related cataract, bilateral: Secondary | ICD-10-CM | POA: Diagnosis not present

## 2021-02-13 DIAGNOSIS — H5203 Hypermetropia, bilateral: Secondary | ICD-10-CM | POA: Diagnosis not present

## 2021-02-13 DIAGNOSIS — D3132 Benign neoplasm of left choroid: Secondary | ICD-10-CM | POA: Diagnosis not present

## 2021-02-13 LAB — HM DIABETES EYE EXAM

## 2021-02-14 ENCOUNTER — Encounter (INDEPENDENT_AMBULATORY_CARE_PROVIDER_SITE_OTHER): Payer: Self-pay | Admitting: Bariatrics

## 2021-02-14 ENCOUNTER — Other Ambulatory Visit: Payer: Self-pay

## 2021-02-14 ENCOUNTER — Encounter: Payer: Self-pay | Admitting: *Deleted

## 2021-02-14 ENCOUNTER — Ambulatory Visit (INDEPENDENT_AMBULATORY_CARE_PROVIDER_SITE_OTHER): Payer: Medicare Other | Admitting: Bariatrics

## 2021-02-14 VITALS — BP 137/85 | HR 68 | Temp 97.5°F | Ht 63.0 in | Wt 196.0 lb

## 2021-02-14 DIAGNOSIS — Z6836 Body mass index (BMI) 36.0-36.9, adult: Secondary | ICD-10-CM | POA: Diagnosis not present

## 2021-02-14 DIAGNOSIS — I152 Hypertension secondary to endocrine disorders: Secondary | ICD-10-CM | POA: Diagnosis not present

## 2021-02-14 DIAGNOSIS — E559 Vitamin D deficiency, unspecified: Secondary | ICD-10-CM

## 2021-02-14 DIAGNOSIS — E1159 Type 2 diabetes mellitus with other circulatory complications: Secondary | ICD-10-CM | POA: Diagnosis not present

## 2021-02-18 ENCOUNTER — Encounter (INDEPENDENT_AMBULATORY_CARE_PROVIDER_SITE_OTHER): Payer: Self-pay | Admitting: Bariatrics

## 2021-02-18 NOTE — Progress Notes (Signed)
Chief Complaint:   OBESITY April Matthews is here to discuss her progress with her obesity treatment plan along with follow-up of her obesity related diagnoses. April Matthews is on the Category 1 Plan and states she is following her eating plan approximately 75-80% of the time. April Matthews states she is not currently exercising.  Today's visit was #: 14 Starting weight: 205 lbs Starting date: 05/09/2020 Today's weight: 196 lbs Today's date: 02/14/2021 Total lbs lost to date: 9 Total lbs lost since last in-office visit: 0  Interim History: April Matthews is up 1 lb since her last visit. She has not been exercising. She is up about 4 lbs of water weight, per bioimpedance.   Subjective:   1. Hypertension associated with type 2 diabetes mellitus ([place]) April Matthews BP is reasonable well controlled.  BP Readings from Last 3 Encounters:  02/14/21 137/85  01/28/21 134/82  01/07/21 (!) 146/84   2. Vitamin D deficiency April Matthews Vitamin D level was 58.5 on 01/28/2021. She is currently taking prescription vitamin D 50,000 IU each week. She denies nausea, vomiting or muscle weakness.  Assessment/Plan:   1. Hypertension associated with type 2 diabetes mellitus ([place]) Sola is working on healthy weight loss and exercise to improve blood pressure control. We will watch for signs of hypotension as she continues her lifestyle modifications. Meal plan. Will continue to adhere closely to the plan. No salt.  2. Vitamin D deficiency Low Vitamin D level contributes to fatigue and are associated with obesity, breast, and colon cancer. She agrees to continue to take prescription Vitamin D @50 ,000 IU every week and will follow-up for routine testing of Vitamin D, at least 2-3 times per year to avoid over-replacement.  3. Obesity, current BMI 34.7 April Matthews is currently in the action stage of change. As such, her goal is to continue with weight loss efforts. She has agreed to the Category 1 Plan.   1. Meal plan 2. Intentional  eating 3. 01/28/2021 labs reviewed 4. Will have one 8 oz glass of water every morning before coffee  Exercise goals: April Matthews will get back to swimming for exercise.  Behavioral modification strategies: increasing lean protein intake, decreasing simple carbohydrates, increasing vegetables, increasing water intake, decreasing eating out, no skipping meals, meal planning and cooking strategies, keeping healthy foods in the home and planning for success.  April Matthews has agreed to follow-up with our clinic in 3-4 weeks. She was informed of the importance of frequent follow-up visits to maximize her success with intensive lifestyle modifications for her multiple health conditions.   Objective:   Blood pressure 137/85, pulse 68, temperature (!) 97.5 F (36.4 C), height 5\' 3"  (1.6 m), weight 196 lb (88.9 kg), SpO2 97 %. Body mass index is 34.72 kg/m.  General: Cooperative, alert, well developed, in no acute distress. HEENT: Conjunctivae and lids unremarkable. Cardiovascular: Regular rhythm.  Lungs: Normal work of breathing. Neurologic: No focal deficits.   Lab Results  Component Value Date   CREATININE 1.13 (H) 01/28/2021   BUN 23 01/28/2021   NA 141 01/28/2021   K 4.7 01/28/2021   CL 103 01/28/2021   CO2 23 01/28/2021   Lab Results  Component Value Date   ALT 13 01/28/2021   AST 16 01/28/2021   ALKPHOS 69 01/28/2021   BILITOT 0.4 01/28/2021   Lab Results  Component Value Date   HGBA1C 6.5 (H) 01/28/2021   HGBA1C 6.8 (A) 06/11/2020   HGBA1C 6.7 (A) 02/01/2020   HGBA1C 6.2 (A) 06/02/2019   HGBA1C 6.2 (A)  09/15/2018   Lab Results  Component Value Date   INSULIN 14.9 01/28/2021   INSULIN 25.5 (H) 05/09/2020   Lab Results  Component Value Date   TSH 5.210 (H) 01/28/2021   Lab Results  Component Value Date   CHOL 129 01/28/2021   HDL 59 01/28/2021   LDLCALC 42 01/28/2021   TRIG 171 (H) 01/28/2021   CHOLHDL 2.5 06/02/2019   Lab Results  Component Value Date   WBC 8.0  06/02/2019   HGB 13.6 06/02/2019   HCT 40.6 06/02/2019   MCV 94 06/02/2019   PLT 346 06/02/2019   No results found for: IRON, TIBC, FERRITIN  Obesity Behavioral Intervention:   Approximately 15 minutes were spent on the discussion below.  ASK: We discussed the diagnosis of obesity with April Matthews today and April Matthews agreed to give [place] permission to discuss obesity behavioral modification therapy today.  ASSESS: April Matthews has the diagnosis of obesity and her BMI today is 34.8. April Matthews is in the action stage of change.   ADVISE: April Matthews was educated on the multiple health risks of obesity as well as the benefit of weight loss to improve her health. She was advised of the need for long term treatment and the importance of lifestyle modifications to improve her current health and to decrease her risk of future health problems.  AGREE: Multiple dietary modification options and treatment options were discussed and April Matthews agreed to follow the recommendations documented in the above note.  ARRANGE: April Matthews was educated on the importance of frequent visits to treat obesity as outlined per CMS and USPSTF guidelines and agreed to schedule her next follow up appointment today.  Attestation Statements:   Reviewed by clinician on day of visit: allergies, medications, problem list, medical history, surgical history, family history, social history, and previous encounter notes.  Coral Ceo, am acting as Location manager for [employer], DO.  I have reviewed the above documentation for accuracy and completeness, and I agree with the above. Jearld Lesch, DO

## 2021-02-21 ENCOUNTER — Encounter: Payer: Self-pay | Admitting: Family Medicine

## 2021-03-01 DIAGNOSIS — R5383 Other fatigue: Secondary | ICD-10-CM | POA: Diagnosis not present

## 2021-03-01 DIAGNOSIS — L4050 Arthropathic psoriasis, unspecified: Secondary | ICD-10-CM | POA: Diagnosis not present

## 2021-03-01 DIAGNOSIS — Z79899 Other long term (current) drug therapy: Secondary | ICD-10-CM | POA: Diagnosis not present

## 2021-03-06 ENCOUNTER — Other Ambulatory Visit: Payer: Self-pay

## 2021-03-06 ENCOUNTER — Ambulatory Visit (INDEPENDENT_AMBULATORY_CARE_PROVIDER_SITE_OTHER): Payer: Medicare Other | Admitting: Bariatrics

## 2021-03-06 ENCOUNTER — Encounter (INDEPENDENT_AMBULATORY_CARE_PROVIDER_SITE_OTHER): Payer: Self-pay | Admitting: Bariatrics

## 2021-03-06 VITALS — BP 137/83 | HR 63 | Temp 98.0°F | Ht 63.0 in | Wt 196.0 lb

## 2021-03-06 DIAGNOSIS — I152 Hypertension secondary to endocrine disorders: Secondary | ICD-10-CM

## 2021-03-06 DIAGNOSIS — E669 Obesity, unspecified: Secondary | ICD-10-CM | POA: Diagnosis not present

## 2021-03-06 DIAGNOSIS — E118 Type 2 diabetes mellitus with unspecified complications: Secondary | ICD-10-CM

## 2021-03-06 DIAGNOSIS — M79605 Pain in left leg: Secondary | ICD-10-CM | POA: Diagnosis not present

## 2021-03-06 DIAGNOSIS — E1159 Type 2 diabetes mellitus with other circulatory complications: Secondary | ICD-10-CM | POA: Diagnosis not present

## 2021-03-06 DIAGNOSIS — E7800 Pure hypercholesterolemia, unspecified: Secondary | ICD-10-CM | POA: Diagnosis not present

## 2021-03-06 DIAGNOSIS — Z6836 Body mass index (BMI) 36.0-36.9, adult: Secondary | ICD-10-CM | POA: Diagnosis not present

## 2021-03-06 DIAGNOSIS — Z79899 Other long term (current) drug therapy: Secondary | ICD-10-CM | POA: Diagnosis not present

## 2021-03-06 DIAGNOSIS — Z6834 Body mass index (BMI) 34.0-34.9, adult: Secondary | ICD-10-CM | POA: Diagnosis not present

## 2021-03-06 DIAGNOSIS — L4050 Arthropathic psoriasis, unspecified: Secondary | ICD-10-CM | POA: Diagnosis not present

## 2021-03-06 DIAGNOSIS — R7989 Other specified abnormal findings of blood chemistry: Secondary | ICD-10-CM | POA: Diagnosis not present

## 2021-03-06 DIAGNOSIS — E559 Vitamin D deficiency, unspecified: Secondary | ICD-10-CM | POA: Diagnosis not present

## 2021-03-06 DIAGNOSIS — L401 Generalized pustular psoriasis: Secondary | ICD-10-CM | POA: Diagnosis not present

## 2021-03-06 DIAGNOSIS — M150 Primary generalized (osteo)arthritis: Secondary | ICD-10-CM | POA: Diagnosis not present

## 2021-03-06 MED ORDER — LOSARTAN POTASSIUM 50 MG PO TABS: ORAL_TABLET | ORAL | 0 refills | Status: DC

## 2021-03-06 MED ORDER — ATORVASTATIN CALCIUM 20 MG PO TABS: ORAL_TABLET | ORAL | 0 refills | Status: DC

## 2021-03-07 ENCOUNTER — Encounter (INDEPENDENT_AMBULATORY_CARE_PROVIDER_SITE_OTHER): Payer: Self-pay | Admitting: Bariatrics

## 2021-03-07 NOTE — Progress Notes (Signed)
Chief Complaint:   OBESITY April Matthews is here to discuss her progress with her obesity treatment plan along with follow-up of her obesity related diagnoses. April Matthews is on the Category 1 Plan and states she is following her eating plan approximately 70% of the time. April Matthews states she is walking 5000 steps 7 times per week.  Today's visit was #: 15 Starting weight: 205 lbs Starting date: 05/09/2020 Today's weight: 196 lbs Today's date: 03/06/2021 Total lbs lost to date: 9 Total lbs lost since last in-office visit: 0  Interim History: April Matthews is walking more and will be swimming. She is eating adequate protein.  Subjective:   1. Pure hypercholesterolemia April Matthews denies myalgias.  2. Hypertension associated with type 2 diabetes mellitus (April Matthews) April Matthews is taking medications as directed.  BP Readings from Last 3 Encounters:  03/06/21 137/83  02/14/21 137/85  01/28/21 134/82   3. Controlled type 2 diabetes mellitus with complication, without long-term current use of insulin (April Matthews) April Matthews is taking Metformin.  Assessment/Plan:   1. Pure hypercholesterolemia Cardiovascular risk and specific lipid/LDL goals reviewed.  We discussed several lifestyle modifications today and April Matthews will continue to work on diet, exercise and weight loss efforts. Orders and follow up as documented in patient record.   Counseling Intensive lifestyle modifications are the first line treatment for this issue. . Dietary changes: Increase soluble fiber. Decrease simple carbohydrates. . Exercise changes: Moderate to vigorous-intensity aerobic activity 150 minutes per week if tolerated. . Lipid-lowering medications: see documented in medical record.  - atorvastatin (LIPITOR) 20 MG tablet; Take 1 tablet at night daily  Dispense: 90 tablet; Refill: 0  2. Hypertension associated with type 2 diabetes mellitus (April Matthews) April Matthews is working on healthy weight loss and exercise to improve blood pressure control. We will watch  for signs of hypotension as she continues her lifestyle modifications.  - losartan (COZAAR) 50 MG tablet; Will take 1 daily  Dispense: 90 tablet; Refill: 0  3. Controlled type 2 diabetes mellitus with complication, without long-term current use of insulin (April Matthews) Good blood sugar control is important to decrease the likelihood of diabetic complications such as nephropathy, neuropathy, limb loss, blindness, coronary artery disease, and death. Intensive lifestyle modification including diet, exercise and weight loss are the first line of treatment for diabetes. Discussed Ozempic for weight loss.  4. Obesity, current BMI 34  April Matthews is currently in the action stage of change. As such, her goal is to continue with weight loss efforts. She has agreed to the Category 1 Plan with intermittent fasting.   Meal plan Will be more complaint with the plan.  Exercise goals: As is  Behavioral modification strategies: increasing lean protein intake, decreasing simple carbohydrates, increasing vegetables, increasing water intake, decreasing eating out, no skipping meals, meal planning and cooking strategies, keeping healthy foods in the home and planning for success.  April Matthews has agreed to follow-up with our clinic in 2-3 weeks. She was informed of the importance of frequent follow-up visits to maximize her success with intensive lifestyle modifications for her multiple health conditions.   Objective:   Blood pressure 137/83, pulse 63, temperature 98 F (36.7 C), height 5\' 3"  (1.6 m), weight 196 lb (88.9 kg), SpO2 96 %. Body mass index is 34.72 kg/m.  General: Cooperative, alert, well developed, in no acute distress. HEENT: Conjunctivae and lids unremarkable. Cardiovascular: Regular rhythm.  Lungs: Normal work of breathing. Neurologic: No focal deficits.   Lab Results  Component Value Date   CREATININE 1.13 (H) 01/28/2021  BUN 23 01/28/2021   NA 141 01/28/2021   K 4.7 01/28/2021   CL 103  01/28/2021   CO2 23 01/28/2021   Lab Results  Component Value Date   ALT 13 01/28/2021   AST 16 01/28/2021   ALKPHOS 69 01/28/2021   BILITOT 0.4 01/28/2021   Lab Results  Component Value Date   HGBA1C 6.5 (H) 01/28/2021   HGBA1C 6.8 (A) 06/11/2020   HGBA1C 6.7 (A) 02/01/2020   HGBA1C 6.2 (A) 06/02/2019   HGBA1C 6.2 (A) 09/15/2018   Lab Results  Component Value Date   INSULIN 14.9 01/28/2021   INSULIN 25.5 (H) 05/09/2020   Lab Results  Component Value Date   TSH 5.210 (H) 01/28/2021   Lab Results  Component Value Date   CHOL 129 01/28/2021   HDL 59 01/28/2021   LDLCALC 42 01/28/2021   TRIG 171 (H) 01/28/2021   CHOLHDL 2.5 06/02/2019   Lab Results  Component Value Date   WBC 8.0 06/02/2019   HGB 13.6 06/02/2019   HCT 40.6 06/02/2019   MCV 94 06/02/2019   PLT 346 06/02/2019   No results found for: IRON, TIBC, FERRITIN  Obesity Behavioral Intervention:   Approximately 15 minutes were spent on the discussion below.  ASK: We discussed the diagnosis of obesity with April Matthews today and April Matthews agreed to give [place] permission to discuss obesity behavioral modification therapy today.  ASSESS: April Matthews has the diagnosis of obesity and her BMI today is 34.9. April Matthews is in the action stage of change.   ADVISE: April Matthews was educated on the multiple health risks of obesity as well as the benefit of weight loss to improve her health. She was advised of the need for long term treatment and the importance of lifestyle modifications to improve her current health and to decrease her risk of future health problems.  AGREE: Multiple dietary modification options and treatment options were discussed and April Matthews agreed to follow the recommendations documented in the above note.  ARRANGE: April Matthews was educated on the importance of frequent visits to treat obesity as outlined per CMS and USPSTF guidelines and agreed to schedule her next follow up appointment today.  Attestation Statements:    Reviewed by clinician on day of visit: allergies, medications, problem list, medical history, surgical history, family history, social history, and previous encounter notes.  Coral Ceo, CMA, am acting as Location manager for [employer], DO.  I have reviewed the above documentation for accuracy and completeness, and I agree with the above. Jearld Lesch, DO

## 2021-03-11 ENCOUNTER — Other Ambulatory Visit (INDEPENDENT_AMBULATORY_CARE_PROVIDER_SITE_OTHER): Payer: Self-pay | Admitting: Bariatrics

## 2021-03-11 DIAGNOSIS — E559 Vitamin D deficiency, unspecified: Secondary | ICD-10-CM

## 2021-03-11 NOTE — Telephone Encounter (Signed)
Refill request

## 2021-04-01 ENCOUNTER — Other Ambulatory Visit: Payer: Self-pay | Admitting: Family Medicine

## 2021-04-01 DIAGNOSIS — I10 Essential (primary) hypertension: Secondary | ICD-10-CM

## 2021-04-01 DIAGNOSIS — E559 Vitamin D deficiency, unspecified: Secondary | ICD-10-CM

## 2021-04-03 ENCOUNTER — Other Ambulatory Visit: Payer: Self-pay

## 2021-04-03 ENCOUNTER — Encounter (INDEPENDENT_AMBULATORY_CARE_PROVIDER_SITE_OTHER): Payer: Self-pay | Admitting: Bariatrics

## 2021-04-03 ENCOUNTER — Ambulatory Visit (INDEPENDENT_AMBULATORY_CARE_PROVIDER_SITE_OTHER): Payer: Medicare Other | Admitting: Bariatrics

## 2021-04-03 VITALS — BP 128/80 | HR 68 | Temp 97.6°F | Ht 63.0 in | Wt 197.0 lb

## 2021-04-03 DIAGNOSIS — E7800 Pure hypercholesterolemia, unspecified: Secondary | ICD-10-CM

## 2021-04-03 DIAGNOSIS — E6609 Other obesity due to excess calories: Secondary | ICD-10-CM

## 2021-04-03 DIAGNOSIS — I152 Hypertension secondary to endocrine disorders: Secondary | ICD-10-CM | POA: Diagnosis not present

## 2021-04-03 DIAGNOSIS — Z6834 Body mass index (BMI) 34.0-34.9, adult: Secondary | ICD-10-CM

## 2021-04-03 DIAGNOSIS — E1169 Type 2 diabetes mellitus with other specified complication: Secondary | ICD-10-CM | POA: Diagnosis not present

## 2021-04-03 DIAGNOSIS — E1159 Type 2 diabetes mellitus with other circulatory complications: Secondary | ICD-10-CM

## 2021-04-03 DIAGNOSIS — Z6836 Body mass index (BMI) 36.0-36.9, adult: Secondary | ICD-10-CM

## 2021-04-03 MED ORDER — OZEMPIC (0.25 OR 0.5 MG/DOSE) 2 MG/1.5ML ~~LOC~~ SOPN: 0.2500 mg | PEN_INJECTOR | SUBCUTANEOUS | 0 refills | Status: DC

## 2021-04-09 NOTE — Progress Notes (Signed)
Chief Complaint:   OBESITY April Matthews is here to discuss her progress with her obesity treatment plan along with follow-up of her obesity related diagnoses. April Matthews is on the Category 1 Plan and states she is following her eating plan approximately 70% of the time. April Matthews states she is swimming and doing water aerobics 60 minutes 4 times per week.  Today's visit was #: 74 Starting weight: 205 lbs Starting date: 05/09/2020 Today's weight: 197 lbs Today's date: 04/03/2021 Total lbs lost to date: 8 Total lbs lost since last in-office visit: 0  Interim History: April Matthews is up 1 lb since her last visit. She is out of school for the summer.  Subjective:   1. Hypertension associated with type 2 diabetes mellitus ([place]) April Matthews is taking Cozaar and Toprol XL.  2. Pure hypercholesterolemia April Matthews is taking Lipitor.  3. Type 2 diabetes mellitus with other specified complication, without long-term current use of insulin ([place]) April Matthews taking Metformin.  Assessment/Plan:   1. Hypertension associated with type 2 diabetes mellitus ([place]) April Matthews is working on healthy weight loss and exercise to improve blood pressure control. We will watch for signs of hypotension as she continues her lifestyle modifications. Continue current treatment plan.  2. Pure hypercholesterolemia Cardiovascular risk and specific lipid/LDL goals reviewed.  We discussed several lifestyle modifications today and April Matthews will continue to work on diet, exercise and weight loss efforts. Orders and follow up as documented in patient record. Continue current treatment plan. No trans fats.  Counseling Intensive lifestyle modifications are the first line treatment for this issue. Dietary changes: Increase soluble fiber. Decrease simple carbohydrates. Exercise changes: Moderate to vigorous-intensity aerobic activity 150 minutes per week if tolerated. Lipid-lowering medications: see documented in medical record.  3. Type 2 diabetes  mellitus with other specified complication, without long-term current use of insulin (April Matthews) April Matthews blood sugar control is important to decrease the likelihood of diabetic complications such as nephropathy, neuropathy, limb loss, blindness, coronary artery disease, and death. Intensive lifestyle modification including diet, exercise and weight loss are the first line of treatment for diabetes.   - Semaglutide,0.25 or 0.5MG /DOS, (OZEMPIC, 0.25 OR 0.5 MG/DOSE,) 2 MG/1.5ML SOPN; Inject 0.25 mg into the skin once a week.  Dispense: 1.5 mL; Refill: 0  4. Obesity with current BMI 34.9  April Matthews is currently in the action stage of change. As such, her goal is to continue with weight loss efforts. She has agreed to the Category 1 Plan.   Meal plan Intentional eating  Exercise goals:  As is  Behavioral modification strategies: increasing lean protein intake, decreasing simple carbohydrates, increasing vegetables, increasing water intake, decreasing eating out, no skipping meals, meal planning and cooking strategies, keeping healthy foods in the home, and planning for success.  April Matthews has agreed to follow-up with our clinic in 4 weeks. She was informed of the importance of frequent follow-up visits to maximize her success with intensive lifestyle modifications for her multiple health conditions.   Objective:   Blood pressure 128/80, pulse 68, temperature 97.6 F (36.4 C), height 5\' 3"  (1.6 m), weight 197 lb (89.4 kg), SpO2 95 %. Body mass index is 34.9 kg/m.  General: Cooperative, alert, well developed, in no acute distress. HEENT: Conjunctivae and lids unremarkable. Cardiovascular: Regular rhythm.  Lungs: Normal work of breathing. Neurologic: No focal deficits.   Lab Results  Component Value Date   CREATININE 1.13 (H) 01/28/2021   BUN 23 01/28/2021   NA 141 01/28/2021   K 4.7 01/28/2021   CL  103 01/28/2021   CO2 23 01/28/2021   Lab Results  Component Value Date   ALT 13 01/28/2021   AST  16 01/28/2021   ALKPHOS 69 01/28/2021   BILITOT 0.4 01/28/2021   Lab Results  Component Value Date   HGBA1C 6.5 (H) 01/28/2021   HGBA1C 6.8 (A) 06/11/2020   HGBA1C 6.7 (A) 02/01/2020   HGBA1C 6.2 (A) 06/02/2019   HGBA1C 6.2 (A) 09/15/2018   Lab Results  Component Value Date   INSULIN 14.9 01/28/2021   INSULIN 25.5 (H) 05/09/2020   Lab Results  Component Value Date   TSH 5.210 (H) 01/28/2021   Lab Results  Component Value Date   CHOL 129 01/28/2021   HDL 59 01/28/2021   LDLCALC 42 01/28/2021   TRIG 171 (H) 01/28/2021   CHOLHDL 2.5 06/02/2019   Lab Results  Component Value Date   WBC 8.0 06/02/2019   HGB 13.6 06/02/2019   HCT 40.6 06/02/2019   MCV 94 06/02/2019   PLT 346 06/02/2019   No results found for: IRON, TIBC, FERRITIN  Obesity Behavioral Intervention:   Approximately 15 minutes were spent on the discussion below.  ASK: We discussed the diagnosis of obesity with Rolena today and Marvell agreed to give [place] permission to discuss obesity behavioral modification therapy today.  ASSESS: Kamiyah has the diagnosis of obesity and her BMI today is 34.9. Emmalena is in the action stage of change.   ADVISE: Maybell was educated on the multiple health risks of obesity as well as the benefit of weight loss to improve her health. She was advised of the need for long term treatment and the importance of lifestyle modifications to improve her current health and to decrease her risk of future health problems.  AGREE: Multiple dietary modification options and treatment options were discussed and Kenzlee agreed to follow the recommendations documented in the above note.  ARRANGE: Nona was educated on the importance of frequent visits to treat obesity as outlined per CMS and USPSTF guidelines and agreed to schedule her next follow up appointment today.  Attestation Statements:   Reviewed by clinician on day of visit: allergies, medications, problem list, medical history,  surgical history, family history, social history, and previous encounter notes.  Coral Ceo, CMA, am acting as Location manager for [employer], DO.  I have reviewed the above documentation for accuracy and completeness, and I agree with the above. Jearld Lesch, DO

## 2021-04-26 DIAGNOSIS — Z23 Encounter for immunization: Secondary | ICD-10-CM | POA: Diagnosis not present

## 2021-04-29 ENCOUNTER — Other Ambulatory Visit (INDEPENDENT_AMBULATORY_CARE_PROVIDER_SITE_OTHER): Payer: Self-pay | Admitting: Bariatrics

## 2021-04-29 DIAGNOSIS — E1169 Type 2 diabetes mellitus with other specified complication: Secondary | ICD-10-CM

## 2021-04-30 DIAGNOSIS — Z79899 Other long term (current) drug therapy: Secondary | ICD-10-CM | POA: Diagnosis not present

## 2021-04-30 DIAGNOSIS — L4050 Arthropathic psoriasis, unspecified: Secondary | ICD-10-CM | POA: Diagnosis not present

## 2021-04-30 NOTE — Telephone Encounter (Signed)
Last OV with Dr Brown 

## 2021-04-30 NOTE — Telephone Encounter (Signed)
Refill request

## 2021-05-01 ENCOUNTER — Ambulatory Visit (INDEPENDENT_AMBULATORY_CARE_PROVIDER_SITE_OTHER): Payer: Medicare Other | Admitting: Bariatrics

## 2021-05-16 ENCOUNTER — Ambulatory Visit (INDEPENDENT_AMBULATORY_CARE_PROVIDER_SITE_OTHER): Payer: Medicare Other | Admitting: Bariatrics

## 2021-05-21 ENCOUNTER — Other Ambulatory Visit: Payer: Self-pay

## 2021-05-21 ENCOUNTER — Ambulatory Visit (INDEPENDENT_AMBULATORY_CARE_PROVIDER_SITE_OTHER): Payer: Medicare Other | Admitting: Bariatrics

## 2021-05-21 ENCOUNTER — Encounter (INDEPENDENT_AMBULATORY_CARE_PROVIDER_SITE_OTHER): Payer: Self-pay | Admitting: Bariatrics

## 2021-05-21 VITALS — BP 133/85 | HR 87 | Temp 97.7°F | Ht 63.0 in | Wt 198.0 lb

## 2021-05-21 DIAGNOSIS — E1159 Type 2 diabetes mellitus with other circulatory complications: Secondary | ICD-10-CM

## 2021-05-21 DIAGNOSIS — E1169 Type 2 diabetes mellitus with other specified complication: Secondary | ICD-10-CM

## 2021-05-21 DIAGNOSIS — I152 Hypertension secondary to endocrine disorders: Secondary | ICD-10-CM | POA: Diagnosis not present

## 2021-05-21 DIAGNOSIS — Z6836 Body mass index (BMI) 36.0-36.9, adult: Secondary | ICD-10-CM | POA: Diagnosis not present

## 2021-05-21 DIAGNOSIS — E669 Obesity, unspecified: Secondary | ICD-10-CM

## 2021-05-26 NOTE — Progress Notes (Signed)
Chief Complaint:   OBESITY Correna is here to discuss her progress with her obesity treatment plan along with follow-up of her obesity related diagnoses. Chaniece is on the Category 1 Plan and states she is following her eating plan approximately 65% of the time. Destynee states she is walking 7,000 steps 7 days a week and swimming 60 minutes 3-4 times per week.  Today's visit was #: 70 Starting weight: 205 lbs Starting date: 05/09/2020 Today's weight: 198 lbs Today's date: 05/21/2021 Total lbs lost to date: 7 lbs Total lbs lost since last in-office visit: 0 lbs  Interim History: Dore is up 1 lb, but has been on a vacation.  Her body fat did go down.  Subjective:   1. Type 2 diabetes mellitus with obesity ([place]) Jenia is currently taking Metformin and Ozempic  Lab Results  Component Value Date   HGBA1C 6.5 (H) 01/28/2021   HGBA1C 6.8 (A) 06/11/2020   HGBA1C 6.7 (A) 02/01/2020   Lab Results  Component Value Date   MICROALBUR 0.8 11/27/2016   LDLCALC 42 01/28/2021   CREATININE 1.13 (H) 01/28/2021   Lab Results  Component Value Date   INSULIN 14.9 01/28/2021   INSULIN 25.5 (H) 05/09/2020   2. Hypertension associated with type 2 diabetes mellitus ([place]) Serinity is currently taking Cozaar.  Assessment/Plan:   1. Type 2 diabetes mellitus with obesity (H[facility]) Good blood sugar control is important to decrease the likelihood of diabetic complications such as nephropathy, neuropathy, limb loss, blindness, coronary artery disease, and death. Intensive lifestyle modification including diet, exercise and weight loss are the first line of treatment for diabetes. Brittney will continue taking Metformin and Ozempic.  2. Hypertension associated with type 2 diabetes mellitus ([place]) Ariyanna is working on healthy weight loss and exercise to improve blood pressure control. We will watch for signs of hypotension as she continues her lifestyle modifications. Continue current treatment plan. She  will continue taking Cozaar.  3. Obesity with current BMI 35.2 Annice is currently in the action stage of change. As such, her goal is to continue with weight loss efforts. She has agreed to the Category 1 Plan.   Meal planning Will adhere closely to the plan  Exercise goals: Swimming and doing aerobics.  Behavioral modification strategies: increasing lean protein intake, decreasing simple carbohydrates, increasing vegetables, increasing water intake, decreasing eating out, no skipping meals, meal planning and cooking strategies, keeping healthy foods in the home, and planning for success.  Jayleana has agreed to follow-up with our clinic in 2 weeks. She was informed of the importance of frequent follow-up visits to maximize her success with intensive lifestyle modifications for her multiple health conditions.   Objective:   Blood pressure 133/85, pulse 87, temperature 97.7 F (36.5 C), height '5\' 3"'$  (1.6 m), weight 198 lb (89.8 kg), SpO2 95 %. Body mass index is 35.07 kg/m.  General: Cooperative, alert, well developed, in no acute distress. HEENT: Conjunctivae and lids unremarkable. Cardiovascular: Regular rhythm.  Lungs: Normal work of breathing. Neurologic: No focal deficits.   Lab Results  Component Value Date   CREATININE 1.13 (H) 01/28/2021   BUN 23 01/28/2021   NA 141 01/28/2021   K 4.7 01/28/2021   CL 103 01/28/2021   CO2 23 01/28/2021   Lab Results  Component Value Date   ALT 13 01/28/2021   AST 16 01/28/2021   ALKPHOS 69 01/28/2021   BILITOT 0.4 01/28/2021   Lab Results  Component Value Date   HGBA1C 6.5 (  H) 01/28/2021   HGBA1C 6.8 (A) 06/11/2020   HGBA1C 6.7 (A) 02/01/2020   HGBA1C 6.2 (A) 06/02/2019   HGBA1C 6.2 (A) 09/15/2018   Lab Results  Component Value Date   INSULIN 14.9 01/28/2021   INSULIN 25.5 (H) 05/09/2020   Lab Results  Component Value Date   TSH 5.210 (H) 01/28/2021   Lab Results  Component Value Date   CHOL 129 01/28/2021   HDL 59  01/28/2021   LDLCALC 42 01/28/2021   TRIG 171 (H) 01/28/2021   CHOLHDL 2.5 06/02/2019   Lab Results  Component Value Date   VD25OH 58.5 01/28/2021   VD25OH 32.7 05/09/2020   VD25OH 32.5 06/02/2019   Lab Results  Component Value Date   WBC 8.0 06/02/2019   HGB 13.6 06/02/2019   HCT 40.6 06/02/2019   MCV 94 06/02/2019   PLT 346 06/02/2019   No results found for: IRON, TIBC, FERRITIN  Obesity Behavioral Intervention:   Approximately 15 minutes were spent on the discussion below.  ASK: We discussed the diagnosis of obesity with Oberia today and Cordie agreed to give [place] permission to discuss obesity behavioral modification therapy today.  ASSESS: Raeleigh has the diagnosis of obesity and her BMI today is 35.2. Ashla is in the action stage of change.   ADVISE: Vernia was educated on the multiple health risks of obesity as well as the benefit of weight loss to improve her health. She was advised of the need for long term treatment and the importance of lifestyle modifications to improve her current health and to decrease her risk of future health problems.  AGREE: Multiple dietary modification options and treatment options were discussed and Katryna agreed to follow the recommendations documented in the above note.  ARRANGE: Lowella was educated on the importance of frequent visits to treat obesity as outlined per CMS and USPSTF guidelines and agreed to schedule her next follow up appointment today.  Attestation Statements:   Reviewed by clinician on day of visit: allergies, medications, problem list, medical history, surgical history, family history, social history, and previous encounter notes.  ILennette Bihari, CMA, am acting as transcriptionist for Dr. Jearld Lesch, DO.  I have reviewed the above documentation for accuracy and completeness, and I agree with the above. Jearld Lesch, DO

## 2021-06-05 ENCOUNTER — Other Ambulatory Visit (INDEPENDENT_AMBULATORY_CARE_PROVIDER_SITE_OTHER): Payer: Self-pay | Admitting: Bariatrics

## 2021-06-05 DIAGNOSIS — E7800 Pure hypercholesterolemia, unspecified: Secondary | ICD-10-CM

## 2021-06-07 ENCOUNTER — Other Ambulatory Visit: Payer: Self-pay | Admitting: Family Medicine

## 2021-06-07 DIAGNOSIS — I152 Hypertension secondary to endocrine disorders: Secondary | ICD-10-CM

## 2021-06-07 DIAGNOSIS — E1159 Type 2 diabetes mellitus with other circulatory complications: Secondary | ICD-10-CM

## 2021-06-07 NOTE — Telephone Encounter (Signed)
Has upcoming appointment

## 2021-06-10 ENCOUNTER — Ambulatory Visit (INDEPENDENT_AMBULATORY_CARE_PROVIDER_SITE_OTHER): Payer: Medicare Other | Admitting: Bariatrics

## 2021-06-10 ENCOUNTER — Other Ambulatory Visit: Payer: Self-pay

## 2021-06-10 ENCOUNTER — Encounter (INDEPENDENT_AMBULATORY_CARE_PROVIDER_SITE_OTHER): Payer: Self-pay | Admitting: Bariatrics

## 2021-06-10 VITALS — BP 138/82 | HR 66 | Temp 98.0°F | Ht 63.0 in | Wt 201.0 lb

## 2021-06-10 DIAGNOSIS — R7989 Other specified abnormal findings of blood chemistry: Secondary | ICD-10-CM | POA: Diagnosis not present

## 2021-06-10 DIAGNOSIS — E559 Vitamin D deficiency, unspecified: Secondary | ICD-10-CM | POA: Diagnosis not present

## 2021-06-10 DIAGNOSIS — E7800 Pure hypercholesterolemia, unspecified: Secondary | ICD-10-CM | POA: Diagnosis not present

## 2021-06-10 DIAGNOSIS — Z6836 Body mass index (BMI) 36.0-36.9, adult: Secondary | ICD-10-CM

## 2021-06-10 DIAGNOSIS — I152 Hypertension secondary to endocrine disorders: Secondary | ICD-10-CM

## 2021-06-10 DIAGNOSIS — E1169 Type 2 diabetes mellitus with other specified complication: Secondary | ICD-10-CM

## 2021-06-10 DIAGNOSIS — E1159 Type 2 diabetes mellitus with other circulatory complications: Secondary | ICD-10-CM | POA: Diagnosis not present

## 2021-06-10 DIAGNOSIS — E669 Obesity, unspecified: Secondary | ICD-10-CM

## 2021-06-10 MED ORDER — OZEMPIC (0.25 OR 0.5 MG/DOSE) 2 MG/1.5ML ~~LOC~~ SOPN: 0.5000 mg | PEN_INJECTOR | SUBCUTANEOUS | 0 refills | Status: DC

## 2021-06-11 LAB — COMPREHENSIVE METABOLIC PANEL
ALT: 16 IU/L (ref 0–32)
AST: 16 IU/L (ref 0–40)
Albumin/Globulin Ratio: 1.8 (ref 1.2–2.2)
Albumin: 4.4 g/dL (ref 3.7–4.7)
Alkaline Phosphatase: 68 IU/L (ref 44–121)
BUN/Creatinine Ratio: 23 (ref 12–28)
BUN: 25 mg/dL (ref 8–27)
Bilirubin Total: 0.4 mg/dL (ref 0.0–1.2)
CO2: 20 mmol/L (ref 20–29)
Calcium: 9.4 mg/dL (ref 8.7–10.3)
Chloride: 109 mmol/L — ABNORMAL HIGH (ref 96–106)
Creatinine, Ser: 1.07 mg/dL — ABNORMAL HIGH (ref 0.57–1.00)
Globulin, Total: 2.5 g/dL (ref 1.5–4.5)
Glucose: 108 mg/dL — ABNORMAL HIGH (ref 65–99)
Potassium: 5.3 mmol/L — ABNORMAL HIGH (ref 3.5–5.2)
Sodium: 145 mmol/L — ABNORMAL HIGH (ref 134–144)
Total Protein: 6.9 g/dL (ref 6.0–8.5)
eGFR: 54 mL/min/{1.73_m2} — ABNORMAL LOW (ref >59)

## 2021-06-11 LAB — TSH+T4F+T3FREE
Free T4: 1.12 ng/dL (ref 0.82–1.77)
T3, Free: 3.1 pg/mL (ref 2.0–4.4)
TSH: 4.08 u[IU]/mL (ref 0.450–4.500)

## 2021-06-11 LAB — LIPID PANEL WITH LDL/HDL RATIO
Cholesterol, Total: 135 mg/dL (ref 100–199)
HDL: 50 mg/dL (ref >39)
LDL Chol Calc (NIH): 61 mg/dL (ref 0–99)
LDL/HDL Ratio: 1.2 ratio (ref 0.0–3.2)
Triglycerides: 139 mg/dL (ref 0–149)
VLDL Cholesterol Cal: 24 mg/dL (ref 5–40)

## 2021-06-11 LAB — HEMOGLOBIN A1C
Est. average glucose Bld gHb Est-mCnc: 140 mg/dL
Hgb A1c MFr Bld: 6.5 % — ABNORMAL HIGH (ref 4.8–5.6)

## 2021-06-11 LAB — VITAMIN D 25 HYDROXY (VIT D DEFICIENCY, FRACTURES): Vit D, 25-Hydroxy: 47.9 ng/mL (ref 30.0–100.0)

## 2021-06-11 LAB — INSULIN, RANDOM: INSULIN: 15.3 u[IU]/mL (ref 2.6–24.9)

## 2021-06-11 NOTE — Progress Notes (Signed)
Chief Complaint:   OBESITY April Matthews is here to discuss her progress with her obesity treatment plan along with follow-up of her obesity related diagnoses. April Matthews is on the Category 1 Plan and states she is following her eating plan approximately 50% of the time. April Matthews states she is doing 0 minutes 0 times per week.  Today's visit was #: 70 Starting weight: 205 lbs Starting date: 05/09/2020 Today's weight: 201 lbs Today's date: 06/10/2021 Total lbs lost to date: 4 lbs Total lbs lost since last in-office visit: 0  Interim History: April Matthews has been "visiting and on vacation" and feels like that is the reason that her weight is up 3 lbs.  Subjective:   1. Type 2 diabetes mellitus with obesity ([place]) April Matthews is currently taking Metformin and Ozempic.  2. Hypertension associated with type 2 diabetes mellitus ([place]) April Matthews's hypertension is well controlled on Cozaar.  3. Pure hypercholesterolemia April Matthews is currently taking Lipitor.  4. Vitamin D deficiency April Matthews is currently taking high dose Vitamin D.  5. Elevated TSH April Matthews's TSH is slightly elevated.  Assessment/Plan:   1. Type 2 diabetes mellitus with obesity ([facility]) Good blood sugar control is important to decrease the likelihood of diabetic complications such as nephropathy, neuropathy, limb loss, blindness, coronary artery disease, and death. Intensive lifestyle modification including diet, exercise and weight loss are the first line of treatment for diabetes. April Matthews will continue medication. We will refill Ozempic 0.5 mg into skin once a week 1.5 mL with no refills.  - Hemoglobin A1c - Insulin, random - Semaglutide,0.25 or 0.'5MG'$ /DOS, (OZEMPIC, 0.25 OR 0.5 MG/DOSE,) 2 MG/1.5ML SOPN; Inject 0.5 mg into the skin once a week.  Dispense: 1.5 mL; Refill: 0  2. Hypertension associated with type 2 diabetes mellitus ([place]) April Matthews is working on healthy weight loss and exercise to improve blood pressure control. We will watch for signs  of hypotension as she continues her lifestyle modifications.  April Matthews will continue medications.   - Comprehensive metabolic panel  3. Pure hypercholesterolemia We will check labs today. April Matthews will continue medications.  - Lipid Panel With LDL/HDL Ratio  4. Vitamin D deficiency Low Vitamin D level contributes to fatigue and are associated with obesity, breast, and colon cancer. April Matthews agrees to continue to take prescription Vitamin D 50,000 IU every week and will follow-up for routine testing of Vitamin D, at least 2-3 times per year to avoid over-replacement. We will check labs today.  - VITAMIN D 25 Hydroxy (Vit-D Deficiency, Fractures)  5. Elevated TSH We will check labs today.  - TSH+T4F+T3Free  6. Obesity with current BMI 35.7 April Matthews is currently in the action stage of change. As such, her goal is to continue with weight loss efforts. She has agreed to the Category 1 Plan.   April Matthews will continue meal planning. She will adhere to the plan at least 80-85%. She will decrease carbohydrates.  Exercise goals: No exercise has been prescribed at this time.  Behavioral modification strategies: increasing lean protein intake, decreasing simple carbohydrates, increasing vegetables, increasing water intake, meal planning and cooking strategies, ways to avoid night time snacking, better snacking choices, emotional eating strategies, and holiday eating strategies .  April Matthews has agreed to follow-up with our clinic in 3 weeks. She was informed of the importance of frequent follow-up visits to maximize her success with intensive lifestyle modifications for her multiple health conditions.   Objective:   Blood pressure 138/82, pulse 66, temperature 98 F (36.7 C), height '5\' 3"'$  (1.6 m), weight  201 lb (91.2 kg), SpO2 96 %. Body mass index is 35.61 kg/m.  General: Cooperative, alert, well developed, in no acute distress. HEENT: Conjunctivae and lids unremarkable. Cardiovascular: Regular rhythm.   Lungs: Normal work of breathing. Neurologic: No focal deficits.   Lab Results  Component Value Date   CREATININE 1.07 (H) 06/10/2021   BUN 25 06/10/2021   NA 145 (H) 06/10/2021   K 5.3 (H) 06/10/2021   CL 109 (H) 06/10/2021   CO2 20 06/10/2021   Lab Results  Component Value Date   ALT 16 06/10/2021   AST 16 06/10/2021   ALKPHOS 68 06/10/2021   BILITOT 0.4 06/10/2021   Lab Results  Component Value Date   HGBA1C 6.5 (H) 06/10/2021   HGBA1C 6.5 (H) 01/28/2021   HGBA1C 6.8 (A) 06/11/2020   HGBA1C 6.7 (A) 02/01/2020   HGBA1C 6.2 (A) 06/02/2019   Lab Results  Component Value Date   INSULIN 15.3 06/10/2021   INSULIN 14.9 01/28/2021   INSULIN 25.5 (H) 05/09/2020   Lab Results  Component Value Date   TSH 4.080 06/10/2021   Lab Results  Component Value Date   CHOL 135 06/10/2021   HDL 50 06/10/2021   LDLCALC 61 06/10/2021   TRIG 139 06/10/2021   CHOLHDL 2.5 06/02/2019   Lab Results  Component Value Date   VD25OH 47.9 06/10/2021   VD25OH 58.5 01/28/2021   VD25OH 32.7 05/09/2020   Lab Results  Component Value Date   WBC 8.0 06/02/2019   HGB 13.6 06/02/2019   HCT 40.6 06/02/2019   MCV 94 06/02/2019   PLT 346 06/02/2019   No results found for: IRON, TIBC, FERRITIN  Obesity Behavioral Intervention:   Approximately 15 minutes were spent on the discussion below.  ASK: We discussed the diagnosis of obesity with April Matthews today and April Matthews agreed to give [place] permission to discuss obesity behavioral modification therapy today.  ASSESS: April Matthews has the diagnosis of obesity and her BMI today is 35.7. April Matthews is in the action stage of change.   ADVISE: April Matthews was educated on the multiple health risks of obesity as well as the benefit of weight loss to improve her health. She was advised of the need for long term treatment and the importance of lifestyle modifications to improve her current health and to decrease her risk of future health problems.  AGREE: Multiple  dietary modification options and treatment options were discussed and April Matthews agreed to follow the recommendations documented in the above note.  ARRANGE: April Matthews was educated on the importance of frequent visits to treat obesity as outlined per CMS and USPSTF guidelines and agreed to schedule her next follow up appointment today.  Attestation Statements:   Reviewed by clinician on day of visit: allergies, medications, problem list, medical history, surgical history, family history, social history, and previous encounter notes.  I, Lizbeth Bark, RMA, am acting as Location manager for [employer], DO.   I have reviewed the above documentation for accuracy and completeness, and I agree with the above. Jearld Lesch, DO

## 2021-06-12 ENCOUNTER — Encounter (INDEPENDENT_AMBULATORY_CARE_PROVIDER_SITE_OTHER): Payer: Self-pay | Admitting: Bariatrics

## 2021-06-16 ENCOUNTER — Other Ambulatory Visit (INDEPENDENT_AMBULATORY_CARE_PROVIDER_SITE_OTHER): Payer: Self-pay | Admitting: Adult Health

## 2021-06-16 DIAGNOSIS — J309 Allergic rhinitis, unspecified: Secondary | ICD-10-CM

## 2021-06-17 NOTE — Telephone Encounter (Signed)
Dr.Brown 

## 2021-06-18 NOTE — Progress Notes (Signed)
Chief Complaint  Patient presents with   Medicare Wellness    Nonfasting AWV with pelvic. Starting .'5mg'$  of Ozempic tonight. She has been having runny stools, wonders if it could be related to the [place], that is about when it started.     April Matthews is a 75 y.o. female who presents for annual wellness visit and follow-up on chronic medical conditions.    Diabetes:  She doesn't check blood sugars, prefers not to.  She is compliant with taking '1500mg'$  of metformin with dinner.  Ozempic was started by [facility] in June. She is tolerating this, notices some intermittent loose stools, otherwise is doing well. No nausea. Denies any numbness or tingling.  Last diabetic eye exam was 01/2021.  She checks feet regularly, no concerns. Denies polydipsia, polyuria or hypoglycemia Lab Results  Component Value Date   HGBA1C 6.5 (H) 06/10/2021   Hypertension: She is compliant with taking losartan '50mg'$  and toprol XL '25mg'$  daily. She hasn't been checking BP's at home recently, just at [facility] clinic.   (monitor verified as accurate in 12/2016) She denies any headaches, dizziness, swelling, chest pain. BP Readings from Last 3 Encounters:  06/10/21 138/82  05/21/21 133/85  04/03/21 128/80   Obesity:  She remains under [facility].  She had some vacations over the summer and gained some weight.  Overall she has only lost 4# in the last year. They are prescribing her ozempic and ergocalciferol. She states she was down to 191# on Christmas, but then got COVID and hasn't been able to lose it again. She continues to limit portions, always splitting meals with her husband when out. She is going to resume diet she had been eating then (tuna, eggs, high protein yogurt).  Hyperlipidemia:  Tolerating atorvastatin without side effects.  Trying to follow lowfat, low cholesterol diet.  Lipids were at goal on last check, recently rechecked by [facility] clinic. Lab Results  Component Value Date    CHOL 135 06/10/2021   HDL 50 06/10/2021   LDLCALC 61 06/10/2021   TRIG 139 06/10/2021   CHOLHDL 2.5 06/02/2019   She has had some borderline thyroid tests in the past, TSH >4 for the past few years.  Rechecked recently by [facility] clinic, stable.  She denies changes in hair/skin/bowels (just as reported above, sporadic loose stool) or energy. +difficulty losing weight. Lab Results  Component Value Date   TSH 4.080 06/10/2021   Psoriatic arthritis:  Under the care of Dr. Amil Amen, last seen in July.  Stelara became unaffordable.  She was switched to an infusion every 2 months from his office (cannot recall the name). Medication is working well--psoriasis is improving, and she denies any joint pains. Skin and joint pains had worsened while off Stelara. She sees Dr. Martinique regularly.  Vitamin D deficiency:  H/o mild deficiency in the past (level of 27 in the past); most recent level 47.9.  She had been switched (prior to this) to prescription ergocalciferol every other weekly, rather than weekly.   Immunization History  Administered Date(s) Administered   Influenza Split 09/08/2012, 08/10/2014, 08/21/2015   Influenza, High Dose Seasonal PF 07/10/2016, 07/13/2017, 07/20/2018, 07/11/2020   PFIZER(Purple Top)SARS-COV-2 Vaccination 11/17/2019, 12/13/2019, 10/16/2020, 04/26/2021   PPD Test 09/08/2012   Pneumococcal Conjugate-13 04/20/2014   Pneumococcal Polysaccharide-23 04/23/2015   Tdap 06/19/2011   Last Pap smear: 05/2019 normal, no high risk HPV detected Last mammogram: 10/2017  Last colonoscopy: 07/2014 mild diverticulosis; Dr. Olevia Perches; repeat 10 years Last  DEXA: 10/2017 T-1.7 L femoral neck Dentist: goes twice yearly Ophtho: goes yearly; also sees retinal specialist Exercise:  She was out of town in July, and states some days this summer were too hot to even get in the pool (it was "unrefreshing").  Limited exercise recently.  Hasn't been walking indoors as much (had heel spur flare up while  in [place]).  Prior routine had been  30 minutes in her home (gets 6-8K steps daily) and swimming an hour daily (swimming and aerobic exercise in the pool), about 5x/week. Has weights, carries them when walking in the house (3#) Has an exercise bike, hasn't been using. It is in a room with no TV.   Other doctors caring for patient include: Dr. Amil Amen (rheum) Dr. Amy Martinique (derm) Dr. Kathrin Penner (ophtho)--just retired Dr. Oval Linsey (retinal specialist/oncologist at [facility] ophtho)  [place] in past, but she left. May be switching. Dr. Luther Parody (dentist)--no longer seeing, switching. Dr. Olevia Perches (retired--GI) Dr. Owens Shark ([facility])    Depression screen:  Negative Functional status screen: unremarkable Fall screen: negative. Mini-Cog screen: normal (score of 5)   End of Life Discussion:  Patient has a medical power of attorney (her daughter) and Living Will. We do not have copies.   PMH, PSH, SH and FH were reviewed and updated  Outpatient Encounter Medications as of 06/19/2021  Medication Sig Note   atorvastatin (LIPITOR) 20 MG tablet Take 1 tablet at night daily    calcium carbonate (TUMS - DOSED IN MG ELEMENTAL CALCIUM) 500 MG chewable tablet Chew 1 tablet by mouth daily. 06/19/2021: Took 1 today for heartburn.  Hasn't been taking it regularly   ipratropium (ATROVENT) 0.03 % nasal spray USE 2 SPRAYS IN EACH NOSTRIL EVERY 12 HOURS    losartan (COZAAR) 50 MG tablet TAKE 1 TABLET(50 MG) BY MOUTH DAILY    metFORMIN (GLUCOPHAGE-XR) 500 MG 24 hr tablet TAKE 3 TABLETS BY MOUTH WITH BREAKFAST    metoprolol succinate (TOPROL-XL) 25 MG 24 hr tablet TAKE 1 TABLET(25 MG) BY MOUTH DAILY    Semaglutide,0.25 or 0.'5MG'$ /DOS, (OZEMPIC, 0.25 OR 0.5 MG/DOSE,) 2 MG/1.5ML SOPN Inject 0.5 mg into the skin once a week. 06/19/2021: Starting .'5mg'$  dose this evening   Vitamin D, Ergocalciferol, (DRISDOL) 1.25 MG (50000 UNIT) CAPS capsule Take 50,000 Units by mouth every 14 (fourteen) days.    fluticasone (FLONASE) 50 MCG/ACT nasal  spray Place 1 spray into both nostrils daily. (Patient not taking: Reported on 06/19/2021)    simethicone (MYLICON) 0000000 MG chewable tablet Chew 125 mg by mouth every 6 (six) hours as needed for flatulence. (Patient not taking: Reported on 06/19/2021)    No facility-administered encounter medications on file as of 06/19/2021.   Allergies  Allergen Reactions   Sesame Oil Anaphylaxis    Sesame seed/oil   Ace Inhibitors Cough   Bee Venom    Latex Rash    ROS:  The patient denies anorexia, fever, headaches,  vision changes, hearing changes, ear pain, sore throat, breast concerns, chest pain, palpitations, dizziness, syncope, dyspnea on exertion, cough, swelling, nausea, vomiting, diarrhea (recent loose stools intermittently), constipation, abdominal pain, melena, hematochezia, hematuria, incontinence, dysuria, vaginal bleeding, discharge, odor or itch, genital lesions, numbness, tingling, weakness, tremor, suspicious skin lesions, depression, anxiety, abnormal bleeding/bruising, or enlarged lymph nodes. Denies any joint pains currently. Psoriasis--improving on new medication. Occasional heartburn Occasional trouble recalling a word, but it comes back to her--infrequent. +snoring per husband.  Wakes up feeling refreshed, no daytime somnolence. 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    PHYSICAL EXAM:  BP 120/70   Pulse 84   Ht '5\' 3"'$  (1.6 m)   Wt 202 lb 9.6 oz (91.9 kg)   BMI 35.89 kg/m   Wt Readings from Last 3 Encounters:  06/19/21 202 lb 9.6 oz (91.9 kg)  06/10/21 201 lb (91.2 kg)  05/21/21 198 lb (89.8 kg)    General Appearance:     Alert, cooperative, no distress, appears stated age   Head:     Normocephalic, without obvious abnormality, atraumatic   Eyes:     PERRL, conjunctiva/corneas clear, EOM's intact, fundi benign   Ears:     Normal TM's and external ear canals    Nose:    Not examined (wearing mask due to COVID-19 pandemic)  Throat:    Not examined (wearing mask due to COVID-19 pandemic)  Neck:    Supple, no lymphadenopathy;  thyroid:  no enlargement/ tenderness/nodules; no JVD.  No bruit appreciable   Back:     Spine nontender, no curvature, ROM normal, no CVA tenderness   Lungs:      Clear to auscultation bilaterally without wheezes, rales or ronchi; respirations unlabored   Chest Wall:     No deformity or tenderness   Heart:     Regular rate and rhythm, S1 and S2 normal, no rub or gallop, 2/6 SEM   Breast Exam:     No tenderness, masses, or nipple discharge (slightly inverted on the left, unchanged per pt).  No axillary lymphadenopathy   Abdomen:      Soft, non-tender, nondistended, normoactive bowel sounds,     no masses, no hepatosplenomegaly. Obese. WHSS.   Genitalia:     Normal external genitalia without lesions, +atrophic changes. BUS and vagina normal; no cervical motion tenderness. No abnormal vaginal discharge.  Uterus and adnexa not enlarged, but exam is somewhat limited by her body habitus; adnexa nontender, no masses.  Pap not performed.    Rectal:     Normal tone, no masses or tenderness. No stool in vault for heme-testing.  Extremities:    No clubbing, cyanosis or edema   Pulses:    2+ and symmetric all extremities   Skin:    Skin turgor normal, no suspicious lesions. Extensive psoriatic plaques (pink in color, not very thick, improving per pt) along lower back/upper buttocks. Small plaque in suprapubic area at midline scar. Purpura noted on L forearm  Lymph nodes:    Cervical, supraclavicular, inguinal and axillary nodes normal   Neurologic:    Normal strength, sensation and gait; reflexes 2+ and symmetric throughout                      Psych:   Normal mood, affect, hygiene and grooming.          Normal diabetic foot exam.   Recent labs from [facility] clinic reviewed:  Vitamin D-OH level 47.9  Lab Results  Component Value Date    TSH 4.080 06/10/2021  Normal free T4 and free T3  Lab Results  Component Value Date   HGBA1C 6.5 (H) 06/10/2021   Lab Results  Component Value Date   CHOL 135 06/10/2021   HDL 50 06/10/2021   LDLCALC 61 06/10/2021   TRIG 139 06/10/2021   CHOLHDL 2.5 06/02/2019   Insulin level 15.3 (normal) Fasting glucose 108    Chemistry      Component Value Date/Time  NA 145 (H) 06/10/2021 1126   K 5.3 (H) 06/10/2021 1126   CL 109 (H) 06/10/2021 1126   CO2 20 06/10/2021 1126   BUN 25 06/10/2021 1126   CREATININE 1.07 (H) 06/10/2021 1126   CREATININE 1.13 (H) 07/13/2017 1118      Component Value Date/Time   CALCIUM 9.4 06/10/2021 1126   ALKPHOS 68 06/10/2021 1126   AST 16 06/10/2021 1126   ALT 16 06/10/2021 1126   BILITOT 0.4 06/10/2021 1126      ASSESSMENT/PLAN:  Medicare annual wellness visit, subsequent  Type 2 diabetes mellitus with other specified complication, without long-term current use of insulin ([place]) - controlled. Ozempic recently added, being titrated up  Hypertension associated with diabetes ([place]) - controlled  Hyperlipidemia associated with type 2 diabetes mellitus ([place]) - at goal on statin  Psoriatic arthritis ([place]) - improving on current treatment (pt couldn't recall name of infusion)  Osteopenia, unspecified location - counseled re: calcium, D, weight-bearing exercise.  Will recheck DEXA - Plan: DG Bone Density  Postmenopausal estrogen deficiency - Plan: DG Bone Density  Pure hypercholesterolemia - continue statin for primary prevention, at goal - Plan: atorvastatin (LIPITOR) 20 MG tablet  Vitamin D deficiency - monitored/treated by [facility], normal - Plan: metFORMIN (GLUCOPHAGE-XR) 500 MG 24 hr tablet  Benign hypertension - controlled - Plan: metoprolol succinate (TOPROL-XL) 25 MG 24 hr tablet  Class 2 severe obesity with serious comorbidity and body mass index (BMI) of 35.0 to 35.9 in adult, unspecified obesity type (HCC) - comorbid--DM, HTN, HLD,  arthritis. Counseled re: exercise--encouraged TV by bike. Cont with [facility]  Discussed monthly self breast exams and yearly mammograms (past due).  Recommended at least 30 minutes of aerobic activity at least 5 days/week, weight-bearing exercise 2x/week; proper sunscreen use reviewed; healthy diet, including goals of calcium and vitamin D intake and alcohol recommendations (less than or equal to 1 drink/day) reviewed; regular seatbelt use; changing batteries in smoke detectors.  Immunization recommendations discussed-- continue yearly high dose flu shots. Shingrix recommended, risks/side effects reviewed, to get from pharmacy. TdaP due from pharmacy. Newly formulated COVID booster recommended when available in late Fall.  Colonoscopy is UTD. Repeat DEXA.   MOST form reviewed/signed. Full Code, Full Care Asked to get [place] copies of her living will and healthcare power of attorney.   Medicare Attestation I have personally reviewed: The patient's medical and social history Their use of alcohol, tobacco or illicit drugs Their current medications and supplements The patient's functional ability including ADLs,fall risks, home safety risks, cognitive, and hearing and visual impairment Diet and physical activities Evidence for depression or mood disorders  The patient's weight, height, BMI have been recorded in the chart.  I have made referrals, counseling, and provided education to the patient based on review of the above and I have provided the patient with a written personalized care plan for preventive services.

## 2021-06-18 NOTE — Patient Instructions (Addendum)
HEALTH MAINTENANCE RECOMMENDATIONS:  It is recommended that you get at least 30 minutes of aerobic exercise at least 5 days/week (for weight loss, you may need as much as 60-90 minutes). This can be any activity that gets your heart rate up. This can be divided in 10-15 minute intervals if needed, but try and build up your endurance at least once a week.  Weight bearing exercise is also recommended twice weekly.  Eat a healthy diet with lots of vegetables, fruits and fiber.  "Colorful" foods have a lot of vitamins (ie green vegetables, tomatoes, red peppers, etc).  Limit sweet tea, regular sodas and alcoholic beverages, all of which has a lot of calories and sugar.  Up to 1 alcoholic drink daily may be beneficial for women (unless trying to lose weight, watch sugars).  Drink a lot of water.  Calcium recommendations are 1200-1500 mg daily (1500 mg for postmenopausal women or women without ovaries), and vitamin D 1000 IU daily.  This should be obtained from diet and/or supplements (vitamins), and calcium should not be taken all at once, but in divided doses.  Monthly self breast exams and yearly mammograms for women over the age of 48 is recommended.  Sunscreen of at least SPF 30 should be used on all sun-exposed parts of the skin when outside between the hours of 10 am and 4 pm (not just when at beach or pool, but even with exercise, golf, tennis, and yard work!)  Use a sunscreen that says "broad spectrum" so it covers both UVA and UVB rays, and make sure to reapply every 1-2 hours.  Remember to change the batteries in your smoke detectors when changing your clock times in the spring and fall. Carbon monoxide detectors are recommended for your home.  Use your seat belt every time you are in a car, and please drive safely and not be distracted with cell phones and texting while driving.   Ms. Wolfinger , Thank you for taking time to come for your Medicare Wellness Visit. I appreciate your ongoing  commitment to your health goals. Please review the following plan we discussed and let me know if I can assist you in the future.    This is a list of the screening recommended for you and due dates:  Health Maintenance  Topic Date Due   Zoster (Shingles) Vaccine (1 of 2) Never done   COVID-19 Vaccine (4 - Booster for Pfizer series) 01/14/2021   Tetanus Vaccine  06/18/2021   Flu Shot  05/27/2021   Complete foot exam   06/11/2021   Hemoglobin A1C  12/11/2021   Eye exam for diabetics  02/13/2022   Colon Cancer Screening  08/25/2024   DEXA scan (bone density measurement)  Completed   Hepatitis C Screening: USPSTF Recommendation to screen - Ages 24-79 yo.  Completed   Pneumonia vaccines  Completed   HPV Vaccine  Aged Out   You are past due for mammogram.  Yearly mammograms are recommended. Schedule your bone density test as well. Be sure to get adequate calcium and weight-bearing exercise.  Recent vitamin D level was normal.  You are due now for a tetanus booster (TdaP). You need to get this from the pharmacy, as this is covered by Medicare Part D.  Continue yearly high dose flu shots (not yet available in our office, should have by early September but you may also get it elsewhere, ie at school, just make sure it is separated from your Tdap (and all vaccines)  by 2 weeks).  I recommend getting the new shingles vaccine (Shingrix). Since you have Medicare, you will need to get this from the pharmacy, as it is covered by Part D. This is a series of 2 injections, spaced 2 months apart.  I encourage you to get the new formulation of the COVID vaccine (with better coverage for the variants) when it becomes available later this Fall.  Pay attention to the news.  You either can get vaccines on the same day, or you need to separate them by at least 2 weeks.  Please get on your exercise bike 30 minutes daily--use with an ipad to watch movies, or move to a room with a TV.

## 2021-06-19 ENCOUNTER — Ambulatory Visit (INDEPENDENT_AMBULATORY_CARE_PROVIDER_SITE_OTHER): Payer: Medicare Other | Admitting: Family Medicine

## 2021-06-19 ENCOUNTER — Other Ambulatory Visit: Payer: Self-pay

## 2021-06-19 ENCOUNTER — Encounter: Payer: Self-pay | Admitting: Family Medicine

## 2021-06-19 VITALS — BP 120/70 | HR 84 | Ht 63.0 in | Wt 202.6 lb

## 2021-06-19 DIAGNOSIS — E559 Vitamin D deficiency, unspecified: Secondary | ICD-10-CM | POA: Diagnosis not present

## 2021-06-19 DIAGNOSIS — Z6835 Body mass index (BMI) 35.0-35.9, adult: Secondary | ICD-10-CM | POA: Diagnosis not present

## 2021-06-19 DIAGNOSIS — E1159 Type 2 diabetes mellitus with other circulatory complications: Secondary | ICD-10-CM

## 2021-06-19 DIAGNOSIS — E7800 Pure hypercholesterolemia, unspecified: Secondary | ICD-10-CM

## 2021-06-19 DIAGNOSIS — E785 Hyperlipidemia, unspecified: Secondary | ICD-10-CM

## 2021-06-19 DIAGNOSIS — E1169 Type 2 diabetes mellitus with other specified complication: Secondary | ICD-10-CM | POA: Diagnosis not present

## 2021-06-19 DIAGNOSIS — Z0000 Encounter for general adult medical examination without abnormal findings: Secondary | ICD-10-CM

## 2021-06-19 DIAGNOSIS — I152 Hypertension secondary to endocrine disorders: Secondary | ICD-10-CM

## 2021-06-19 DIAGNOSIS — I10 Essential (primary) hypertension: Secondary | ICD-10-CM

## 2021-06-19 DIAGNOSIS — L4050 Arthropathic psoriasis, unspecified: Secondary | ICD-10-CM | POA: Diagnosis not present

## 2021-06-19 DIAGNOSIS — Z780 Asymptomatic menopausal state: Secondary | ICD-10-CM

## 2021-06-19 DIAGNOSIS — M8580 Other specified disorders of bone density and structure, unspecified site: Secondary | ICD-10-CM

## 2021-06-19 MED ORDER — ATORVASTATIN CALCIUM 20 MG PO TABS: ORAL_TABLET | ORAL | 3 refills | Status: DC

## 2021-06-19 MED ORDER — METFORMIN HCL ER 500 MG PO TB24: ORAL_TABLET | ORAL | 3 refills | Status: DC

## 2021-06-19 MED ORDER — METOPROLOL SUCCINATE ER 25 MG PO TB24: 25.0000 mg | ORAL_TABLET | Freq: Every day | ORAL | 3 refills | Status: DC

## 2021-06-22 ENCOUNTER — Other Ambulatory Visit (INDEPENDENT_AMBULATORY_CARE_PROVIDER_SITE_OTHER): Payer: Self-pay | Admitting: Bariatrics

## 2021-06-22 DIAGNOSIS — E669 Obesity, unspecified: Secondary | ICD-10-CM

## 2021-06-22 DIAGNOSIS — E1169 Type 2 diabetes mellitus with other specified complication: Secondary | ICD-10-CM

## 2021-06-24 NOTE — Telephone Encounter (Signed)
Dr.Brown 

## 2021-06-28 DIAGNOSIS — L4050 Arthropathic psoriasis, unspecified: Secondary | ICD-10-CM | POA: Diagnosis not present

## 2021-06-30 ENCOUNTER — Other Ambulatory Visit: Payer: Self-pay | Admitting: Family Medicine

## 2021-06-30 DIAGNOSIS — I10 Essential (primary) hypertension: Secondary | ICD-10-CM

## 2021-07-10 ENCOUNTER — Ambulatory Visit (INDEPENDENT_AMBULATORY_CARE_PROVIDER_SITE_OTHER): Payer: Medicare Other | Admitting: Family Medicine

## 2021-07-10 ENCOUNTER — Encounter (INDEPENDENT_AMBULATORY_CARE_PROVIDER_SITE_OTHER): Payer: Self-pay | Admitting: Family Medicine

## 2021-07-10 ENCOUNTER — Other Ambulatory Visit: Payer: Self-pay

## 2021-07-10 VITALS — BP 125/77 | HR 69 | Temp 97.5°F | Ht 63.0 in | Wt 201.0 lb

## 2021-07-10 DIAGNOSIS — E1169 Type 2 diabetes mellitus with other specified complication: Secondary | ICD-10-CM

## 2021-07-10 DIAGNOSIS — Z6836 Body mass index (BMI) 36.0-36.9, adult: Secondary | ICD-10-CM | POA: Diagnosis not present

## 2021-07-10 MED ORDER — OZEMPIC (0.25 OR 0.5 MG/DOSE) 2 MG/1.5ML ~~LOC~~ SOPN: 0.5000 mg | PEN_INJECTOR | SUBCUTANEOUS | 0 refills | Status: DC

## 2021-07-11 NOTE — Progress Notes (Signed)
Chief Complaint:   OBESITY April Matthews is here to discuss her progress with her obesity treatment plan along with follow-up of her obesity related diagnoses. April Matthews is on the Category 1 Plan and states she is following her eating plan approximately 50% of the time. April Matthews states she is walking 5,000 steps 5 times per week.   Today's visit was #: 33 Starting weight: 205 lbs Starting date: 05/09/2020 Today's weight: 201 lbs Today's date: 07/10/2021 Total lbs lost to date: 4 Total lbs lost since last in-office visit: 0  Interim History: April Matthews is April Matthews patient, and her last office visit was 1 month ago. She was away in [place] on vacation, but she didn't gain weight. She declines to change to journaling. She will discuss with Dr. Owens Shark at her next office visit as needed.  Subjective:   1. Type 2 diabetes mellitus with other specified complication, without long-term current use of insulin ([place]) April Matthews started Ozempic at 0.5 mg last week for the first time. She is on metformin and Ozempic, and she is tolerating them well and denies side effects with larger dose. She is not checking her BGs at all. Her last A1c was 6.5.  Assessment/Plan:  No orders of the defined types were placed in this encounter.   Medications Discontinued During This Encounter  Medication Reason   Semaglutide,0.25 or 0.'5MG'$ /DOS, (OZEMPIC, 0.25 OR 0.5 MG/DOSE,) 2 MG/1.5ML SOPN Reorder     Meds ordered this encounter  Medications   Semaglutide,0.25 or 0.'5MG'$ /DOS, (OZEMPIC, 0.25 OR 0.5 MG/DOSE,) 2 MG/1.5ML SOPN    Sig: Inject 0.5 mg into the skin once a week.    Dispense:  1.5 mL    Refill:  0    30 d supply;  ** OV for RF **   Do not send RF request     1. Type 2 diabetes mellitus with other specified complication, without long-term current use of insulin ([place]) April Matthews will continue her prudent nutritional plan, decrease simple carbohydrates, and try to eat on the plan more. Good blood sugar control is  important to decrease the likelihood of diabetic complications such as nephropathy, neuropathy, limb loss, blindness, coronary artery disease, and death. Intensive lifestyle modification including diet, exercise and weight loss are the first line of treatment for diabetes.   - Semaglutide,0.25 or 0.'5MG'$ /DOS, (OZEMPIC, 0.25 OR 0.5 MG/DOSE,) 2 MG/1.5ML SOPN; Inject 0.5 mg into the skin once a week.  Dispense: 1.5 mL; Refill: 0  2. Obesity with current BMI of 35.7 [place] is currently in the action stage of change. As such, her goal is to continue with weight loss efforts. She has agreed to the Category 1 Plan.   Exercise goals: 10,000 steps daily is the ultimate goal.  Behavioral modification strategies: increasing lean protein intake, decreasing simple carbohydrates, keeping healthy foods in the home, and avoiding temptations.  April Matthews has agreed to follow-up with our clinic in 3 weeks. She was informed of the importance of frequent follow-up visits to maximize her success with intensive lifestyle modifications for her multiple health conditions.   Objective:   Blood pressure 125/77, pulse 69, temperature (!) 97.5 F (36.4 C), height '5\' 3"'$  (1.6 m), weight 201 lb (91.2 kg), SpO2 96 %. Body mass index is 35.61 kg/m.  General: Cooperative, alert, well developed, in no acute distress. HEENT: Conjunctivae and lids unremarkable. Cardiovascular: Regular rhythm.  Lungs: Normal work of breathing. Neurologic: No focal deficits.   Lab Results  Component Value Date   CREATININE 1.07 (H)  06/10/2021   BUN 25 06/10/2021   NA 145 (H) 06/10/2021   K 5.3 (H) 06/10/2021   CL 109 (H) 06/10/2021   CO2 20 06/10/2021   Lab Results  Component Value Date   ALT 16 06/10/2021   AST 16 06/10/2021   ALKPHOS 68 06/10/2021   BILITOT 0.4 06/10/2021   Lab Results  Component Value Date   HGBA1C 6.5 (H) 06/10/2021   HGBA1C 6.5 (H) 01/28/2021   HGBA1C 6.8 (A) 06/11/2020   HGBA1C 6.7 (A) 02/01/2020   HGBA1C  6.2 (A) 06/02/2019   Lab Results  Component Value Date   INSULIN 15.3 06/10/2021   INSULIN 14.9 01/28/2021   INSULIN 25.5 (H) 05/09/2020   Lab Results  Component Value Date   TSH 4.080 06/10/2021   Lab Results  Component Value Date   CHOL 135 06/10/2021   HDL 50 06/10/2021   LDLCALC 61 06/10/2021   TRIG 139 06/10/2021   CHOLHDL 2.5 06/02/2019   Lab Results  Component Value Date   VD25OH 47.9 06/10/2021   VD25OH 58.5 01/28/2021   VD25OH 32.7 05/09/2020   Lab Results  Component Value Date   WBC 8.0 06/02/2019   HGB 13.6 06/02/2019   HCT 40.6 06/02/2019   MCV 94 06/02/2019   PLT 346 06/02/2019   No results found for: IRON, TIBC, FERRITIN  Obesity Behavioral Intervention:   Approximately 15 minutes were spent on the discussion below.  ASK: We discussed the diagnosis of obesity with Sally-Anne today and Mairyn agreed to give [place] permission to discuss obesity behavioral modification therapy today.  ASSESS: April Matthews has the diagnosis of obesity and her BMI today is 35.7. April Matthews is in the action stage of change.   ADVISE: April Matthews was educated on the multiple health risks of obesity as well as the benefit of weight loss to improve her health. She was advised of the need for long term treatment and the importance of lifestyle modifications to improve her current health and to decrease her risk of future health problems.  AGREE: Multiple dietary modification options and treatment options were discussed and April Matthews agreed to follow the recommendations documented in the above note.  ARRANGE: April Matthews was educated on the importance of frequent visits to treat obesity as outlined per CMS and USPSTF guidelines and agreed to schedule her next follow up appointment today.  Attestation Statements:   Reviewed by clinician on day of visit: allergies, medications, problem list, medical history, surgical history, family history, social history, and previous encounter notes.   April Matthews, am acting as transcriptionist for [employer], DO.  I have reviewed the above documentation for accuracy and completeness, and I agree with the above. April Matthews, D.O.  The [place] was signed into law in 2016 which includes the topic of electronic health records.  This provides immediate access to information in MyChart.  This includes consultation notes, operative notes, office notes, lab results and pathology reports.  If you have any questions about what you read please let us know at your next visit so we can discuss your concerns and take corrective action if need be.  We are right here with you.

## 2021-07-29 ENCOUNTER — Telehealth: Payer: Self-pay | Admitting: Family Medicine

## 2021-07-29 NOTE — Chronic Care Management (AMB) (Signed)
  Chronic Care Management   Outreach Note  07/29/2021 Name: April Matthews MRN: [id number] DOB: 05-09-1946  Referred by: Rita Ohara, MD Reason for referral : No chief complaint on file.   An unsuccessful telephone outreach was attempted today. The patient was referred to the pharmacist for assistance with care management and care coordination.   Follow Up Plan:   Tatjana Dellinger Upstream Scheduler

## 2021-08-05 ENCOUNTER — Telehealth: Payer: Self-pay | Admitting: Family Medicine

## 2021-08-05 NOTE — Chronic Care Management (AMB) (Signed)
  Chronic Care Management   Outreach Note  08/05/2021 Name: April Matthews MRN: [id number] DOB: July 06, 1946  Referred by: Rita Ohara, MD Reason for referral : No chief complaint on file.   A second unsuccessful telephone outreach was attempted today. The patient was referred to pharmacist for assistance with care management and care coordination.  Follow Up Plan:   Tatjana Dellinger Upstream Scheduler

## 2021-08-06 ENCOUNTER — Encounter (INDEPENDENT_AMBULATORY_CARE_PROVIDER_SITE_OTHER): Payer: Self-pay

## 2021-08-07 ENCOUNTER — Telehealth: Payer: Self-pay | Admitting: Family Medicine

## 2021-08-07 ENCOUNTER — Other Ambulatory Visit (INDEPENDENT_AMBULATORY_CARE_PROVIDER_SITE_OTHER): Payer: Self-pay | Admitting: Family Medicine

## 2021-08-07 DIAGNOSIS — E1169 Type 2 diabetes mellitus with other specified complication: Secondary | ICD-10-CM

## 2021-08-07 NOTE — Chronic Care Management (AMB) (Signed)
  Chronic Care Management   Note  08/07/2021 Name: Prezley Qadir MRN: [id number] DOB: [birth year]  Ardenia Stiner is a 75 y.o. year old female who is a primary care patient of Rita Ohara, MD. I reached out to [employer] by phone today in response to a referral sent by Ms. Priscella Mckellar's PCP, Rita Ohara, MD.   Ms. Vollman was given information about Chronic Care Management services today including:  CCM service includes personalized support from designated clinical staff supervised by her physician, including individualized plan of care and coordination with other care providers 24/7 contact phone numbers for assistance for urgent and routine care needs. Service will only be billed when office clinical staff spend 20 minutes or more in a month to coordinate care. Only one practitioner may furnish and bill the service in a calendar month. The patient may stop CCM services at any time (effective at the end of the month) by phone call to the office staff.   Patient wishes to consider information provided and/or speak with a member of the care team before deciding about enrollment in care management services.   Follow up plan:   Tatjana Secretary/administrator

## 2021-08-08 ENCOUNTER — Ambulatory Visit (INDEPENDENT_AMBULATORY_CARE_PROVIDER_SITE_OTHER): Payer: Medicare Other | Admitting: Bariatrics

## 2021-08-12 ENCOUNTER — Ambulatory Visit (INDEPENDENT_AMBULATORY_CARE_PROVIDER_SITE_OTHER): Payer: Medicare Other | Admitting: Bariatrics

## 2021-08-22 ENCOUNTER — Ambulatory Visit (INDEPENDENT_AMBULATORY_CARE_PROVIDER_SITE_OTHER): Payer: Medicare Other | Admitting: Bariatrics

## 2021-08-23 DIAGNOSIS — L4050 Arthropathic psoriasis, unspecified: Secondary | ICD-10-CM | POA: Diagnosis not present

## 2021-08-30 ENCOUNTER — Other Ambulatory Visit (INDEPENDENT_AMBULATORY_CARE_PROVIDER_SITE_OTHER): Payer: Self-pay | Admitting: Bariatrics

## 2021-08-30 DIAGNOSIS — E1159 Type 2 diabetes mellitus with other circulatory complications: Secondary | ICD-10-CM

## 2021-08-30 DIAGNOSIS — I152 Hypertension secondary to endocrine disorders: Secondary | ICD-10-CM

## 2021-09-02 ENCOUNTER — Ambulatory Visit (INDEPENDENT_AMBULATORY_CARE_PROVIDER_SITE_OTHER): Payer: Medicare Other | Admitting: Bariatrics

## 2021-09-02 ENCOUNTER — Encounter (INDEPENDENT_AMBULATORY_CARE_PROVIDER_SITE_OTHER): Payer: Self-pay | Admitting: Bariatrics

## 2021-09-02 ENCOUNTER — Other Ambulatory Visit: Payer: Self-pay

## 2021-09-02 VITALS — BP 130/80 | HR 87 | Temp 98.3°F | Ht 63.0 in | Wt 200.0 lb

## 2021-09-02 DIAGNOSIS — E1169 Type 2 diabetes mellitus with other specified complication: Secondary | ICD-10-CM

## 2021-09-02 DIAGNOSIS — Z6836 Body mass index (BMI) 36.0-36.9, adult: Secondary | ICD-10-CM | POA: Diagnosis not present

## 2021-09-02 DIAGNOSIS — E559 Vitamin D deficiency, unspecified: Secondary | ICD-10-CM | POA: Diagnosis not present

## 2021-09-02 DIAGNOSIS — J309 Allergic rhinitis, unspecified: Secondary | ICD-10-CM

## 2021-09-02 MED ORDER — OZEMPIC (0.25 OR 0.5 MG/DOSE) 2 MG/1.5ML ~~LOC~~ SOPN: 0.5000 mg | PEN_INJECTOR | SUBCUTANEOUS | 0 refills | Status: DC

## 2021-09-02 MED ORDER — VITAMIN D (ERGOCALCIFEROL) 1.25 MG (50000 UNIT) PO CAPS: 50000.0000 [IU] | ORAL_CAPSULE | ORAL | 0 refills | Status: DC

## 2021-09-02 MED ORDER — IPRATROPIUM BROMIDE 0.03 % NA SOLN: NASAL | 1 refills | Status: DC

## 2021-09-03 ENCOUNTER — Encounter (INDEPENDENT_AMBULATORY_CARE_PROVIDER_SITE_OTHER): Payer: Self-pay | Admitting: Bariatrics

## 2021-09-03 NOTE — Progress Notes (Signed)
Chief Complaint:   OBESITY Dorlis is here to discuss her progress with her obesity treatment plan along with follow-up of her obesity related diagnoses. Jalayia is on the Category 1 Plan and states she is following her eating plan approximately 60% of the time. Serenidy states she is counting steps 7,000  5 times per week.  Today's visit was #: 20 Starting weight: 205 lbs Starting date: 05/09/2020 Today's weight: 200 lbs Today's date: 09/02/2021 Total lbs lost to date: 5 lbs Total lbs lost since last in-office visit: 1 lbs  Interim History: Lauren is down 1 additional pound since the last visit.  Subjective:   1. Allergic rhinitis, unspecified seasonality, unspecified trigger Ramsey is using Atrovent as needed.  2. Type 2 diabetes mellitus with other specified complication, without long-term current use of insulin ([place]) Annitta is currently taking Ozempic 0.5 mg.  3. Vitamin D deficiency Bridney is taking Vitamin D as directed.   Assessment/Plan:   1. Allergic rhinitis, unspecified seasonality, unspecified trigger We will refill Atrovent 0.03% with 1 refill.   - ipratropium (ATROVENT) 0.03 % nasal spray; Use as directed  Dispense: 60 mL; Refill: 1  2. Type 2 diabetes mellitus with other specified complication, without long-term current use of insulin (HCC) We will refill Ozempic 0.5 mg with no refills. Good blood sugar control is important to decrease the likelihood of diabetic complications such as nephropathy, neuropathy, limb loss, blindness, coronary artery disease, and death. Intensive lifestyle modification including diet, exercise and weight loss are the first line of treatment for diabetes.   - Semaglutide,0.25 or 0.5MG /DOS, (OZEMPIC, 0.25 OR 0.5 MG/DOSE,) 2 MG/1.5ML SOPN; Inject 0.5 mg into the skin once a week.  Dispense: 1.5 mL; Refill: 0  3. Vitamin D deficiency Low Vitamin D level contributes to fatigue and are associated with obesity, breast, and colon cancer. We  will refill prescription Vitamin D 50,000 IU every week and Inessa will follow-up for routine testing of Vitamin D, at least 2-3 times per year to avoid over-replacement.  - Vitamin D, Ergocalciferol, (DRISDOL) 1.25 MG (50000 UNIT) CAPS capsule; Take 1 capsule (50,000 Units total) by mouth every 14 (fourteen) days.  Dispense: 6 capsule; Refill: 0  4. Class 2 severe obesity with serious comorbidity and body mass index (BMI) of 36.0 to 36.9 in adult, unspecified obesity type ([place]) Terrilyn is currently in the action stage of change. As such, her goal is to continue with weight loss efforts. She has agreed to the Category 1 Plan.   Alita will adhere closely to the plan 80-90%. She will keep her water and protein levels high. Holiday strategies were given today.   Exercise goals:  As is.  Behavioral modification strategies: increasing lean protein intake, decreasing simple carbohydrates, increasing vegetables, increasing water intake, decreasing eating out, no skipping meals, meal planning and cooking strategies, keeping healthy foods in the home, and planning for success.  Aitanna has agreed to follow-up with our clinic in 3 weeks. She was informed of the importance of frequent follow-up visits to maximize her success with intensive lifestyle modifications for her multiple health conditions.   Objective:   Blood pressure 130/80, pulse 87, temperature 98.3 F (36.8 C), height 5\' 3"  (1.6 m), weight 200 lb (90.7 kg), SpO2 97 %. Body mass index is 35.43 kg/m.  General: Cooperative, alert, well developed, in no acute distress. HEENT: Conjunctivae and lids unremarkable. Cardiovascular: Regular rhythm.  Lungs: Normal work of breathing. Neurologic: No focal deficits.   Lab Results  Component Value Date   CREATININE 1.07 (H) 06/10/2021   BUN 25 06/10/2021   NA 145 (H) 06/10/2021   K 5.3 (H) 06/10/2021   CL 109 (H) 06/10/2021   CO2 20 06/10/2021   Lab Results  Component Value Date   ALT 16  06/10/2021   AST 16 06/10/2021   ALKPHOS 68 06/10/2021   BILITOT 0.4 06/10/2021   Lab Results  Component Value Date   HGBA1C 6.5 (H) 06/10/2021   HGBA1C 6.5 (H) 01/28/2021   HGBA1C 6.8 (A) 06/11/2020   HGBA1C 6.7 (A) 02/01/2020   HGBA1C 6.2 (A) 06/02/2019   Lab Results  Component Value Date   INSULIN 15.3 06/10/2021   INSULIN 14.9 01/28/2021   INSULIN 25.5 (H) 05/09/2020   Lab Results  Component Value Date   TSH 4.080 06/10/2021   Lab Results  Component Value Date   CHOL 135 06/10/2021   HDL 50 06/10/2021   LDLCALC 61 06/10/2021   TRIG 139 06/10/2021   CHOLHDL 2.5 06/02/2019   Lab Results  Component Value Date   VD25OH 47.9 06/10/2021   VD25OH 58.5 01/28/2021   VD25OH 32.7 05/09/2020   Lab Results  Component Value Date   WBC 8.0 06/02/2019   HGB 13.6 06/02/2019   HCT 40.6 06/02/2019   MCV 94 06/02/2019   PLT 346 06/02/2019   No results found for: IRON, TIBC, FERRITIN  Obesity Behavioral Intervention:   Approximately 15 minutes were spent on the discussion below.  ASK: We discussed the diagnosis of obesity with Kamilah today and Kamela agreed to give [place] permission to discuss obesity behavioral modification therapy today.  ASSESS: Charlcie has the diagnosis of obesity and her BMI today is 35.5. Mizuki is in the action stage of change.   ADVISE: Shaliyah was educated on the multiple health risks of obesity as well as the benefit of weight loss to improve her health. She was advised of the need for long term treatment and the importance of lifestyle modifications to improve her current health and to decrease her risk of future health problems.  AGREE: Multiple dietary modification options and treatment options were discussed and Danay agreed to follow the recommendations documented in the above note.  ARRANGE: Antwan was educated on the importance of frequent visits to treat obesity as outlined per CMS and USPSTF guidelines and agreed to schedule her next  follow up appointment today.  Attestation Statements:   Reviewed by clinician on day of visit: allergies, medications, problem list, medical history, surgical history, family history, social history, and previous encounter notes.  I, Lizbeth Bark, RMA, am acting as Location manager for [employer], DO.   I have reviewed the above documentation for accuracy and completeness, and I agree with the above. Jearld Lesch, DO

## 2021-09-10 ENCOUNTER — Other Ambulatory Visit: Payer: Self-pay | Admitting: Family Medicine

## 2021-09-10 DIAGNOSIS — E7800 Pure hypercholesterolemia, unspecified: Secondary | ICD-10-CM

## 2021-09-16 ENCOUNTER — Other Ambulatory Visit: Payer: Self-pay

## 2021-09-16 ENCOUNTER — Encounter (INDEPENDENT_AMBULATORY_CARE_PROVIDER_SITE_OTHER): Payer: Self-pay | Admitting: Family Medicine

## 2021-09-16 ENCOUNTER — Ambulatory Visit (INDEPENDENT_AMBULATORY_CARE_PROVIDER_SITE_OTHER): Payer: Medicare Other | Admitting: Family Medicine

## 2021-09-16 VITALS — BP 155/82 | HR 79 | Temp 97.8°F | Ht 63.0 in | Wt 204.0 lb

## 2021-09-16 DIAGNOSIS — Z6836 Body mass index (BMI) 36.0-36.9, adult: Secondary | ICD-10-CM | POA: Diagnosis not present

## 2021-09-16 DIAGNOSIS — E1169 Type 2 diabetes mellitus with other specified complication: Secondary | ICD-10-CM | POA: Diagnosis not present

## 2021-09-16 DIAGNOSIS — I10 Essential (primary) hypertension: Secondary | ICD-10-CM | POA: Diagnosis not present

## 2021-09-16 MED ORDER — OZEMPIC (0.25 OR 0.5 MG/DOSE) 2 MG/1.5ML ~~LOC~~ SOPN
0.5000 mg | PEN_INJECTOR | SUBCUTANEOUS | 0 refills | Status: DC
Start: 2021-09-16 — End: 2021-10-17

## 2021-09-17 NOTE — Progress Notes (Signed)
Chief Complaint:   OBESITY April Matthews is here to discuss her progress with her obesity treatment plan along with follow-up of her obesity related diagnoses. April Matthews is on the Category 1 Plan and states she is following her eating plan approximately 10% of the time. April Matthews states she is not currently exercising.  Today's visit was #: 21 Starting weight: 205 lbs Starting date: 05/09/2020 Today's weight: 204 lbs Today's date: 09/16/2021 Total lbs lost to date: 1 Total lbs lost since last in-office visit: +4  Interim History: April Matthews is unable to eat an entire salad from [place] or other meals. She is not getting all foods in on many occasions. Pt has been experiencing a flare up of her heel spur lately and is unable to exercise.  Subjective:   1. Type 2 diabetes mellitus with other specified complication, without long-term current use of insulin ([place]) April Matthews is taking Ozempic. She does not check her blood sugars at home. Her A1c was checked at her PCP's office early November.  2. Essential hypertension BP higher than goal today. Pt is asymptomatic with no concerns. Medication: Cozaar, Toprol  Assessment/Plan:  No orders of the defined types were placed in this encounter.   Medications Discontinued During This Encounter  Medication Reason   Semaglutide,0.25 or 0.5MG /DOS, (OZEMPIC, 0.25 OR 0.5 MG/DOSE,) 2 MG/1.5ML SOPN Reorder     Meds ordered this encounter  Medications   Semaglutide,0.25 or 0.5MG /DOS, (OZEMPIC, 0.25 OR 0.5 MG/DOSE,) 2 MG/1.5ML SOPN    Sig: Inject 0.5 mg into the skin once a week.    Dispense:  1.5 mL    Refill:  0    30 d supply;  ** OV for RF **   Do not send RF request     1. Type 2 diabetes mellitus with other specified complication, without long-term current use of insulin (HCC) Pt declines increase in dose of Ozempic. Pt will bring in results of all labs to next OV. Good blood sugar control is important to decrease the likelihood of diabetic  complications such as nephropathy, neuropathy, limb loss, blindness, coronary artery disease, and death. Intensive lifestyle modification including diet, exercise and weight loss are the first line of treatment for diabetes.   Refill- Semaglutide,0.25 or 0.5MG /DOS, (OZEMPIC, 0.25 OR 0.5 MG/DOSE,) 2 MG/1.5ML SOPN; Inject 0.5 mg into the skin once a week.  Dispense: 1.5 mL; Refill: 0  2. Essential hypertension Decrease salt intake and we will follow closely. April Matthews is working on healthy weight loss and exercise to improve blood pressure control. We will watch for signs of hypotension as she continues her lifestyle modifications.  3. Obesity with current BMI of 36.2  April Matthews is currently in the action stage of change. As such, her goal is to continue with weight loss efforts. She has agreed to change to the Category 1 Plan- Pt provided with new handout.   Focus on protein intake and following meal plan. Start biking 30 minutes 3 days a week.  Exercise goals: All adults should avoid inactivity. Some physical activity is better than none, and adults who participate in any amount of physical activity gain some health benefits.  Behavioral modification strategies: increasing lean protein intake, decreasing simple carbohydrates, and holiday eating strategies .  April Matthews has agreed to follow-up with our clinic in 2-3 weeks. She was informed of the importance of frequent follow-up visits to maximize her success with intensive lifestyle modifications for her multiple health conditions.   Objective:   Blood pressure (!) 155/82, pulse  79, temperature 97.8 F (36.6 C), height 5\' 3"  (1.6 m), weight 204 lb (92.5 kg), SpO2 95 %. Body mass index is 36.14 kg/m.  General: Cooperative, alert, well developed, in no acute distress. HEENT: Conjunctivae and lids unremarkable. Cardiovascular: Regular rhythm.  Lungs: Normal work of breathing. Neurologic: No focal deficits.   Lab Results  Component Value Date    CREATININE 1.07 (H) 06/10/2021   BUN 25 06/10/2021   NA 145 (H) 06/10/2021   K 5.3 (H) 06/10/2021   CL 109 (H) 06/10/2021   CO2 20 06/10/2021   Lab Results  Component Value Date   ALT 16 06/10/2021   AST 16 06/10/2021   ALKPHOS 68 06/10/2021   BILITOT 0.4 06/10/2021   Lab Results  Component Value Date   HGBA1C 6.5 (H) 06/10/2021   HGBA1C 6.5 (H) 01/28/2021   HGBA1C 6.8 (A) 06/11/2020   HGBA1C 6.7 (A) 02/01/2020   HGBA1C 6.2 (A) 06/02/2019   Lab Results  Component Value Date   INSULIN 15.3 06/10/2021   INSULIN 14.9 01/28/2021   INSULIN 25.5 (H) 05/09/2020   Lab Results  Component Value Date   TSH 4.080 06/10/2021   Lab Results  Component Value Date   CHOL 135 06/10/2021   HDL 50 06/10/2021   LDLCALC 61 06/10/2021   TRIG 139 06/10/2021   CHOLHDL 2.5 06/02/2019   Lab Results  Component Value Date   VD25OH 47.9 06/10/2021   VD25OH 58.5 01/28/2021   VD25OH 32.7 05/09/2020   Lab Results  Component Value Date   WBC 8.0 06/02/2019   HGB 13.6 06/02/2019   HCT 40.6 06/02/2019   MCV 94 06/02/2019   PLT 346 06/02/2019   No results found for: IRON, TIBC, FERRITIN  Obesity Behavioral Intervention:   Approximately 15 minutes were spent on the discussion below.  ASK: We discussed the diagnosis of obesity with Adiah today and Argelia agreed to give [place] permission to discuss obesity behavioral modification therapy today.  ASSESS: Aolani has the diagnosis of obesity and her BMI today is 36.2. Candus is in the action stage of change.   ADVISE: Izabell was educated on the multiple health risks of obesity as well as the benefit of weight loss to improve her health. She was advised of the need for long term treatment and the importance of lifestyle modifications to improve her current health and to decrease her risk of future health problems.  AGREE: Multiple dietary modification options and treatment options were discussed and Mackensie agreed to follow the recommendations  documented in the above note.  ARRANGE: Hebe was educated on the importance of frequent visits to treat obesity as outlined per CMS and USPSTF guidelines and agreed to schedule her next follow up appointment today.  Attestation Statements:   Reviewed by clinician on day of visit: allergies, medications, problem list, medical history, surgical history, family history, social history, and previous encounter notes.  Coral Ceo, CMA, am acting as transcriptionist for [employer], DO.   I have reviewed the above documentation for accuracy and completeness, and I agree with the above. Marjory Sneddon, D.O.  The [place] was signed into law in 2016 which includes the topic of electronic health records.  This provides immediate access to information in MyChart.  This includes consultation notes, operative notes, office notes, lab results and pathology reports.  If you have any questions about what you read please let us know at your next visit so we can discuss your  concerns and take corrective action if need be.  We are right here with you.

## 2021-10-02 ENCOUNTER — Other Ambulatory Visit: Payer: Self-pay

## 2021-10-02 ENCOUNTER — Encounter: Payer: Self-pay | Admitting: Family Medicine

## 2021-10-02 ENCOUNTER — Ambulatory Visit (INDEPENDENT_AMBULATORY_CARE_PROVIDER_SITE_OTHER): Payer: Medicare Other | Admitting: Family Medicine

## 2021-10-02 VITALS — BP 144/92 | HR 80 | Ht 63.0 in | Wt 209.0 lb

## 2021-10-02 DIAGNOSIS — I10 Essential (primary) hypertension: Secondary | ICD-10-CM

## 2021-10-02 DIAGNOSIS — E1169 Type 2 diabetes mellitus with other specified complication: Secondary | ICD-10-CM | POA: Diagnosis not present

## 2021-10-02 DIAGNOSIS — Z23 Encounter for immunization: Secondary | ICD-10-CM | POA: Diagnosis not present

## 2021-10-02 DIAGNOSIS — R002 Palpitations: Secondary | ICD-10-CM

## 2021-10-02 DIAGNOSIS — R252 Cramp and spasm: Secondary | ICD-10-CM

## 2021-10-02 LAB — POCT GLYCOSYLATED HEMOGLOBIN (HGB A1C): Hemoglobin A1C: 6.2 % — AB (ref 4.0–5.6)

## 2021-10-02 NOTE — Patient Instructions (Signed)
  Avoid caffeine. Stay well hydrated. Pay attention and record your symptoms--also check your pulse to see if it is regular or irregular.  Your swelling and weight gain are likely related, and contributed by excess sodium in the diet. Avoid soups (unless reading labels), canned foods (read labels, limit sodium). I would check with other pharmacies regarding availability of ozempic (and transfer prescription).

## 2021-10-02 NOTE — Progress Notes (Signed)
Chief Complaint  Patient presents with   Tachycardia    Heart has been racing. She says it usually happens once or twice a year but stops. Woke up last week with some pain in her jaw and her heart was racing. Heart racing did not stop. Her watch said her pulse was varying. Never got over 77. Has noticed her weight has gone up quickly.   12/2 (Friday night) she got woken up L sided jaw pain (had been laying on that side).  She then noticed her heart racing.  She never had jaw pain in the past.  She changed position, rubbed her jaw and it felt better.  Later she also noticed some discomfort around her chin, but never on the R side.  Heart continued to race--calmed a little, but then would speed up again.  She hadn't been wearing her watch at the time.  When she put it on around 15 mins later, ranged from 68-98.  Didn't feel her pulse (was relying on the watch). She reports it stopped on its own, pulse gradually came down. No shortness of breath or chest pain, no diaphoresis.  Recalls having a cappuccino after school periodically, can't recall if she had one that day. Also drinks some tea at school. She also recalls having to get up 4x that night to void (and was a fair amount each time). Denies excessive thirst. She doesn't check blood sugars.  She has been off ozempic due to being unable to get it from the pharmacy.  Has been off for 3 weeks.  (Symptoms occurred 2 weeks after being out).  She has also been having more leg cramps in the middle of the night. She also notes swelling in her legs, weight gain. Frustrated by the weight gain, wonders if she should stay on the ozempic (only got to 0.5mg  dose). Admits to some ham and soups (homemade, but +canned foods) recently.   Lab Results  Component Value Date   HGBA1C 6.5 (H) 06/10/2021    HTN:  monitored by [facility]. Doesn't check BP elsewhere.  She denies any missed doses of her medications. BP Readings from Last 3 Encounters:  10/02/21 (!)  190/110  09/16/21 (!) 155/82  09/02/21 130/80    PMH, PSH, SH reviewed  Outpatient Encounter Medications as of 10/02/2021  Medication Sig Note   atorvastatin (LIPITOR) 20 MG tablet TAKE 1 TABLET(20 MG) BY MOUTH DAILY    ipratropium (ATROVENT) 0.03 % nasal spray Use as directed    losartan (COZAAR) 50 MG tablet TAKE 1 TABLET(50 MG) BY MOUTH DAILY 10/02/2021: Takes in eveing   metFORMIN (GLUCOPHAGE-XR) 500 MG 24 hr tablet TAKE 3 TABLETS BY MOUTH WITH BREAKFAST 10/02/2021: Takes in evening   metoprolol succinate (TOPROL-XL) 25 MG 24 hr tablet Take 1 tablet (25 mg total) by mouth daily. 10/02/2021: Takes in evening   Multiple Vitamin (MULTIVITAMIN) tablet Take 1 tablet by mouth daily.    simethicone (MYLICON) 226 MG chewable tablet Chew 125 mg by mouth every 6 (six) hours as needed for flatulence. 10/02/2021: Took one yesterday   vitamin B-12 (CYANOCOBALAMIN) 1000 MCG tablet Take 1,000 mcg by mouth daily.    Vitamin D, Ergocalciferol, (DRISDOL) 1.25 MG (50000 UNIT) CAPS capsule Take 1 capsule (50,000 Units total) by mouth every 14 (fourteen) days.    calcium carbonate (TUMS - DOSED IN MG ELEMENTAL CALCIUM) 500 MG chewable tablet Chew 1 tablet by mouth daily. (Patient not taking: Reported on 10/02/2021) 10/02/2021: Takes as needed  fluticasone (FLONASE) 50 MCG/ACT nasal spray Place 1 spray into both nostrils daily. (Patient not taking: Reported on 10/02/2021)    Semaglutide,0.25 or 0.5MG /DOS, (OZEMPIC, 0.25 OR 0.5 MG/DOSE,) 2 MG/1.5ML SOPN Inject 0.5 mg into the skin once a week. (Patient not taking: Reported on 10/02/2021) 10/02/2021: Has been off for 3 weeks due to not being able to get   No facility-administered encounter medications on file as of 10/02/2021.   Allergies  Allergen Reactions   Sesame Oil Anaphylaxis    Sesame seed/oil   Ace Inhibitors Cough   Bee Venom    Latex Rash   ROS:  no fever, chills, URI symptoms, chest pain.  +tachycardia/palpitations per HPI, jaw pain, edema, weight  gain.  No SOB/DOE. No GI complaints. One night of urinary frequency per HPI, resolved.   PHYSICAL EXAM:  BP (!) 190/110   Pulse 80   Ht 5\' 3"  (1.6 m)   Wt 209 lb (94.8 kg)   BMI 37.02 kg/m   Repeat BP was 144/92  Wt Readings from Last 3 Encounters:  10/02/21 209 lb (94.8 kg)  09/16/21 204 lb (92.5 kg)  09/02/21 200 lb (90.7 kg)   Well-appearing, talkative, slightly anxious female in no distress HEENT: conjunctiva and sclera are clear, EOMI, wearing mask Neck: No lymphadenopathy, thyromegaly or bruit Heart: regular rate and rhythm, no murmur or ectopy. Lungs: clear bilaterally Back: no CVA tenderness Abdomen: soft, nontender Extremities: no pitting edema, normal pulses Psych: normal mood (slightly anxious), affect, hygiene and grooming Neuro: alert and oriented, normal gait  EKG:  NSR, normal  Lab Results  Component Value Date   HGBA1C 6.2 (A) 10/02/2021     ASSESSMENT/PLAN:  Palpitations - Ddx reviewed--suspect PVC/PAC's and sinus tach, poss anxiety related to jaw pain, or caffeine. If recurs, send for monitor/cardiac eval - Plan: TSH, CBC with Differential/Platelet, Comprehensive metabolic panel, EKG 17-PZWC  Essential hypertension - very high today, improved on recheck; anxiety component. Discussed low Na diet, monitor BP. Cont current meds - Plan: EKG 12-Lead  Type 2 diabetes mellitus with other specified complication, without long-term current use of insulin (HCC) - Sugars are controlled even off the ozempic currently. Encouraged restarting to help with wt loss and glu - Plan: HgB A1c  Leg cramps - Ddx reviewed. Advised NOT to drink gatorade (rec by friend) due to Na. Increase fluids. Discussed other measures (Mg, tonic water, white bar of soap) - Plan: Comprehensive metabolic panel, EKG 58-NIDP  Need for influenza vaccination - Plan: Flu Vaccine QUAD High Dose(Fluad)   Avoid caffeine. Stay well hydrated. Pay attention and record your symptoms--also check  your pulse to see if it is regular or irregular.  Your swelling and weight gain are likely related, and contributed by excess sodium in the diet. Avoid soups (unless reading labels), canned foods (read labels, limit sodium). I would check with other pharmacies regarding availability of ozempic (and transfer prescription).

## 2021-10-03 LAB — CBC WITH DIFFERENTIAL/PLATELET
Basophils Absolute: 0.1 10*3/uL (ref 0.0–0.2)
Basos: 1 %
EOS (ABSOLUTE): 0.2 10*3/uL (ref 0.0–0.4)
Eos: 3 %
Hematocrit: 37.9 % (ref 34.0–46.6)
Hemoglobin: 13 g/dL (ref 11.1–15.9)
Immature Grans (Abs): 0 10*3/uL (ref 0.0–0.1)
Immature Granulocytes: 0 %
Lymphocytes Absolute: 1.9 10*3/uL (ref 0.7–3.1)
Lymphs: 29 %
MCH: 32.7 pg (ref 26.6–33.0)
MCHC: 34.3 g/dL (ref 31.5–35.7)
MCV: 95 fL (ref 79–97)
Monocytes Absolute: 0.5 10*3/uL (ref 0.1–0.9)
Monocytes: 8 %
Neutrophils Absolute: 3.9 10*3/uL (ref 1.4–7.0)
Neutrophils: 59 %
Platelets: 331 10*3/uL (ref 150–450)
RBC: 3.98 x10E6/uL (ref 3.77–5.28)
RDW: 12.1 % (ref 11.7–15.4)
WBC: 6.7 10*3/uL (ref 3.4–10.8)

## 2021-10-03 LAB — COMPREHENSIVE METABOLIC PANEL
ALT: 20 IU/L (ref 0–32)
AST: 18 IU/L (ref 0–40)
Albumin/Globulin Ratio: 1.7 (ref 1.2–2.2)
Albumin: 4.3 g/dL (ref 3.7–4.7)
Alkaline Phosphatase: 74 IU/L (ref 44–121)
BUN/Creatinine Ratio: 22 (ref 12–28)
BUN: 26 mg/dL (ref 8–27)
Bilirubin Total: 0.2 mg/dL (ref 0.0–1.2)
CO2: 26 mmol/L (ref 20–29)
Calcium: 9.9 mg/dL (ref 8.7–10.3)
Chloride: 104 mmol/L (ref 96–106)
Creatinine, Ser: 1.16 mg/dL — ABNORMAL HIGH (ref 0.57–1.00)
Globulin, Total: 2.6 g/dL (ref 1.5–4.5)
Glucose: 143 mg/dL — ABNORMAL HIGH (ref 70–99)
Potassium: 4.8 mmol/L (ref 3.5–5.2)
Sodium: 143 mmol/L (ref 134–144)
Total Protein: 6.9 g/dL (ref 6.0–8.5)
eGFR: 49 mL/min/{1.73_m2} — ABNORMAL LOW (ref >59)

## 2021-10-03 LAB — TSH: TSH: 2.99 u[IU]/mL (ref 0.450–4.500)

## 2021-10-04 DIAGNOSIS — D225 Melanocytic nevi of trunk: Secondary | ICD-10-CM | POA: Diagnosis not present

## 2021-10-04 DIAGNOSIS — L821 Other seborrheic keratosis: Secondary | ICD-10-CM | POA: Diagnosis not present

## 2021-10-04 DIAGNOSIS — L309 Dermatitis, unspecified: Secondary | ICD-10-CM | POA: Diagnosis not present

## 2021-10-04 DIAGNOSIS — L400 Psoriasis vulgaris: Secondary | ICD-10-CM | POA: Diagnosis not present

## 2021-10-04 DIAGNOSIS — D1801 Hemangioma of skin and subcutaneous tissue: Secondary | ICD-10-CM | POA: Diagnosis not present

## 2021-10-09 ENCOUNTER — Other Ambulatory Visit: Payer: Self-pay | Admitting: Family Medicine

## 2021-10-09 DIAGNOSIS — E1159 Type 2 diabetes mellitus with other circulatory complications: Secondary | ICD-10-CM

## 2021-10-11 DIAGNOSIS — M79605 Pain in left leg: Secondary | ICD-10-CM | POA: Diagnosis not present

## 2021-10-11 DIAGNOSIS — Z6837 Body mass index (BMI) 37.0-37.9, adult: Secondary | ICD-10-CM | POA: Diagnosis not present

## 2021-10-11 DIAGNOSIS — M150 Primary generalized (osteo)arthritis: Secondary | ICD-10-CM | POA: Diagnosis not present

## 2021-10-11 DIAGNOSIS — L4050 Arthropathic psoriasis, unspecified: Secondary | ICD-10-CM | POA: Diagnosis not present

## 2021-10-11 DIAGNOSIS — E669 Obesity, unspecified: Secondary | ICD-10-CM | POA: Diagnosis not present

## 2021-10-11 DIAGNOSIS — E559 Vitamin D deficiency, unspecified: Secondary | ICD-10-CM | POA: Diagnosis not present

## 2021-10-11 DIAGNOSIS — L401 Generalized pustular psoriasis: Secondary | ICD-10-CM | POA: Diagnosis not present

## 2021-10-11 DIAGNOSIS — R7989 Other specified abnormal findings of blood chemistry: Secondary | ICD-10-CM | POA: Diagnosis not present

## 2021-10-11 DIAGNOSIS — Z79899 Other long term (current) drug therapy: Secondary | ICD-10-CM | POA: Diagnosis not present

## 2021-10-17 ENCOUNTER — Other Ambulatory Visit: Payer: Self-pay

## 2021-10-17 ENCOUNTER — Ambulatory Visit (INDEPENDENT_AMBULATORY_CARE_PROVIDER_SITE_OTHER): Payer: Medicare Other | Admitting: Bariatrics

## 2021-10-17 ENCOUNTER — Other Ambulatory Visit (HOSPITAL_COMMUNITY): Payer: Self-pay

## 2021-10-17 ENCOUNTER — Encounter (INDEPENDENT_AMBULATORY_CARE_PROVIDER_SITE_OTHER): Payer: Self-pay | Admitting: Bariatrics

## 2021-10-17 VITALS — BP 144/79 | HR 64 | Temp 98.3°F | Ht 63.0 in | Wt 207.0 lb

## 2021-10-17 DIAGNOSIS — E1169 Type 2 diabetes mellitus with other specified complication: Secondary | ICD-10-CM | POA: Diagnosis not present

## 2021-10-17 DIAGNOSIS — Z6836 Body mass index (BMI) 36.0-36.9, adult: Secondary | ICD-10-CM

## 2021-10-17 DIAGNOSIS — E559 Vitamin D deficiency, unspecified: Secondary | ICD-10-CM

## 2021-10-17 MED ORDER — OZEMPIC (0.25 OR 0.5 MG/DOSE) 2 MG/1.5ML ~~LOC~~ SOPN
0.5000 mg | PEN_INJECTOR | SUBCUTANEOUS | 0 refills | Status: DC
  Filled 2021-10-17: qty 1.5, 28d supply, fill #0

## 2021-10-17 MED ORDER — VITAMIN D (ERGOCALCIFEROL) 1.25 MG (50000 UNIT) PO CAPS
50000.0000 [IU] | ORAL_CAPSULE | ORAL | 0 refills | Status: DC
  Filled 2021-10-17: qty 6, 84d supply, fill #0

## 2021-10-17 NOTE — Progress Notes (Signed)
Chief Complaint:   OBESITY April Matthews is here to discuss her progress with her obesity treatment plan along with follow-up of her obesity related diagnoses. April Matthews is on the Category 1 Plan and states she is following her eating plan approximately 40% of the time. April Matthews states she is doing 0 minutes 0 times per week.  Today's visit was #: 7 Starting weight: 205 lbs Starting date: 05/09/2020 Today's weight: 207 lbs Today's date: 10/17/2021 Total lbs lost to date: 0 Total lbs lost since last in-office visit: 0  Interim History: April Matthews is up 3 lbs since she has gotten off the Ozempic 5 mg, but unable to get.  Subjective:   1. Type 2 diabetes mellitus with other specified complication, without long-term current use of insulin ([place]) April Matthews is taking Metformin and Ozempic currently. Her last A1C level is 6.2.  2. Vitamin D deficiency April Matthews is taking her medications as directed.  Assessment/Plan:   1. Type 2 diabetes mellitus with other specified complication, without long-term current use of insulin (HCC) We will refill Ozempic 0.5 mg for 1 month with no refills.Good blood sugar control is important to decrease the likelihood of diabetic complications such as nephropathy, neuropathy, limb loss, blindness, coronary artery disease, and death. Intensive lifestyle modification including diet, exercise and weight loss are the first line of treatment for diabetes.   - Semaglutide,0.25 or 0.5MG /DOS, (OZEMPIC, 0.25 OR 0.5 MG/DOSE,) 2 MG/1.5ML SOPN; Inject 0.5 mg into the skin once a week.  Dispense: 1.5 mL; Refill: 0  2. Vitamin D deficiency Low Vitamin D level contributes to fatigue and are associated with obesity, breast, and colon cancer. We will refill prescription Vitamin D 50,000 IU every other week and April Matthews will follow-up for routine testing of Vitamin D, at least 2-3 times per year to avoid over-replacement.  - Vitamin D, Ergocalciferol, (DRISDOL) 1.25 MG (50000 UNIT) CAPS capsule;  Take 1 capsule (50,000 Units total) by mouth every 14 (fourteen) days.  Dispense: 6 capsule; Refill: 0  3. Obesity, current BMI 36.7 April Matthews is currently in the action stage of change. As such, her goal is to continue with weight loss efforts. She has agreed to the Category 1 Plan.   April Matthews will increase protein. She will keep water high.  Exercise goals:  April Matthews will start exercising (bike) and she will go slow.  Behavioral modification strategies: increasing lean protein intake, decreasing simple carbohydrates, increasing vegetables, increasing water intake, decreasing eating out, no skipping meals, meal planning and cooking strategies, keeping healthy foods in the home, and planning for success.  April Matthews has agreed to follow-up with our clinic in 4 weeks. She was informed of the importance of frequent follow-up visits to maximize her success with intensive lifestyle modifications for her multiple health conditions.   Objective:   Blood pressure (!) 144/79, pulse 64, temperature 98.3 F (36.8 C), height 5\' 3"  (1.6 m), weight 207 lb (93.9 kg), SpO2 95 %. Body mass index is 36.67 kg/m.  General: Cooperative, alert, well developed, in no acute distress. HEENT: Conjunctivae and lids unremarkable. Cardiovascular: Regular rhythm.  Lungs: Normal work of breathing. Neurologic: No focal deficits.   Lab Results  Component Value Date   CREATININE 1.16 (H) 10/02/2021   BUN 26 10/02/2021   NA 143 10/02/2021   K 4.8 10/02/2021   CL 104 10/02/2021   CO2 26 10/02/2021   Lab Results  Component Value Date   ALT 20 10/02/2021   AST 18 10/02/2021   ALKPHOS 74 10/02/2021  BILITOT 0.2 10/02/2021   Lab Results  Component Value Date   HGBA1C 6.2 (A) 10/02/2021   HGBA1C 6.5 (H) 06/10/2021   HGBA1C 6.5 (H) 01/28/2021   HGBA1C 6.8 (A) 06/11/2020   HGBA1C 6.7 (A) 02/01/2020   Lab Results  Component Value Date   INSULIN 15.3 06/10/2021   INSULIN 14.9 01/28/2021   INSULIN 25.5 (H) 05/09/2020    Lab Results  Component Value Date   TSH 2.990 10/02/2021   Lab Results  Component Value Date   CHOL 135 06/10/2021   HDL 50 06/10/2021   LDLCALC 61 06/10/2021   TRIG 139 06/10/2021   CHOLHDL 2.5 06/02/2019   Lab Results  Component Value Date   VD25OH 47.9 06/10/2021   VD25OH 58.5 01/28/2021   VD25OH 32.7 05/09/2020   Lab Results  Component Value Date   WBC 6.7 10/02/2021   HGB 13.0 10/02/2021   HCT 37.9 10/02/2021   MCV 95 10/02/2021   PLT 331 10/02/2021   No results found for: IRON, TIBC, FERRITIN  Attestation Statements:   Reviewed by clinician on day of visit: allergies, medications, problem list, medical history, surgical history, family history, social history, and previous encounter notes.  I, Lizbeth Bark, RMA, am acting as Location manager for [employer], DO.  I have reviewed the above documentation for accuracy and completeness, and I agree with the above. Jearld Lesch, DO

## 2021-10-18 DIAGNOSIS — L4050 Arthropathic psoriasis, unspecified: Secondary | ICD-10-CM | POA: Diagnosis not present

## 2021-10-22 ENCOUNTER — Encounter (INDEPENDENT_AMBULATORY_CARE_PROVIDER_SITE_OTHER): Payer: Self-pay | Admitting: Bariatrics

## 2021-11-13 DIAGNOSIS — H00014 Hordeolum externum left upper eyelid: Secondary | ICD-10-CM | POA: Diagnosis not present

## 2021-11-18 ENCOUNTER — Ambulatory Visit (INDEPENDENT_AMBULATORY_CARE_PROVIDER_SITE_OTHER): Payer: Medicare Other | Admitting: Bariatrics

## 2021-11-18 ENCOUNTER — Encounter (INDEPENDENT_AMBULATORY_CARE_PROVIDER_SITE_OTHER): Payer: Self-pay | Admitting: Bariatrics

## 2021-11-18 ENCOUNTER — Other Ambulatory Visit: Payer: Self-pay

## 2021-11-18 VITALS — BP 144/84 | HR 77 | Temp 98.0°F | Ht 63.0 in | Wt 209.0 lb

## 2021-11-18 DIAGNOSIS — E785 Hyperlipidemia, unspecified: Secondary | ICD-10-CM

## 2021-11-18 DIAGNOSIS — I10 Essential (primary) hypertension: Secondary | ICD-10-CM

## 2021-11-18 DIAGNOSIS — Z6837 Body mass index (BMI) 37.0-37.9, adult: Secondary | ICD-10-CM

## 2021-11-18 DIAGNOSIS — E1169 Type 2 diabetes mellitus with other specified complication: Secondary | ICD-10-CM

## 2021-11-18 NOTE — Progress Notes (Signed)
Chief Complaint:   OBESITY April Matthews is here to discuss her progress with her obesity treatment plan along with follow-up of her obesity related diagnoses. April Matthews is on the Category 1 Plan and states she is following her eating plan approximately 50% of the time. April Matthews states she is riding a bike for 40 minutes 7 times per week.  Today's visit was #: 23 Starting weight: 205 lbs Starting date: 05/09/2020 Today's weight: 209 lbs Today's date: 11/18/2021 Total lbs lost to date: 0 Total lbs lost since last in-office visit: 0  Interim History: April Matthews is up 2 lbs since her last office visit ( over the holidays). She has not started the Ozempic at this time.  Subjective:   1. Essential hypertension April Matthews is taking Cozaar currently. Her blood pressure is reasonably well controlled.  Her blood pressure was 144/79 today.  2. Hyperlipidemia associated with type 2 diabetes mellitus (April Matthews) April Matthews is currently taking Lipitor.   3. Type 2 diabetes mellitus with other specified complication, without long-term current use of insulin (April Matthews) April Matthews has Ozempic but she is not taking it.  Assessment/Plan:   1. Essential hypertension Brie will continue taking her medications. She is working on healthy weight loss and exercise to improve blood pressure control. We will watch for signs of hypotension as she continues her lifestyle modifications.  2. Hyperlipidemia associated with type 2 diabetes mellitus (April Matthews) April Matthews will continue taking Lipitor. Cardiovascular risk and specific lipid/LDL goals reviewed.  We discussed several lifestyle modifications today and April Matthews will continue to work on diet, exercise and weight loss efforts. Orders and follow up as documented in patient record.   Counseling Intensive lifestyle modifications are the first line treatment for this issue. Dietary changes: Increase soluble fiber. Decrease simple carbohydrates. Exercise changes: Moderate to vigorous-intensity  aerobic activity 150 minutes per week if tolerated. Lipid-lowering medications: see documented in medical record.  3. Type 2 diabetes mellitus with other specified complication, without long-term current use of insulin (April Matthews) April Matthews will resume Ozempic.  If no progress with Ozempic, will consider other options. Good blood sugar control is important to decrease the likelihood of diabetic complications such as nephropathy, neuropathy, limb loss, blindness, coronary artery disease, and death. Intensive lifestyle modification including diet, exercise and weight loss are the first line of treatment for diabetes.   4. Obesity, current BMI 37.1 April Matthews is currently in the action stage of change. As such, her goal is to continue with weight loss efforts. She has agreed to the Category 1 Plan.   April Matthews will continue meal planning and she will continue intentional eating. She will adhere closely to the plan.  Exercise goals:  As is. April Matthews will increase her endurance as tolerated.  Behavioral modification strategies: increasing lean protein intake, decreasing simple carbohydrates, increasing vegetables, increasing water intake, decreasing eating out, no skipping meals, meal planning and cooking strategies, keeping healthy foods in the home, and planning for success.  April Matthews has agreed to follow-up with our clinic in 3 weeks. She was informed of the importance of frequent follow-up visits to maximize her success with intensive lifestyle modifications for her multiple health conditions.   Objective:   Blood pressure (!) 144/84, pulse 77, temperature 98 F (36.7 C), height 5\' 3"  (1.6 m), weight 209 lb (94.8 kg), SpO2 95 %. Body mass index is 37.02 kg/m.  General: Cooperative, alert, well developed, in no acute distress. HEENT: Conjunctivae and lids unremarkable. Cardiovascular: Regular rhythm.  Lungs: Normal work of breathing. Neurologic: No focal deficits.  Lab Results  Component Value Date    CREATININE 1.16 (H) 10/02/2021   BUN 26 10/02/2021   NA 143 10/02/2021   K 4.8 10/02/2021   CL 104 10/02/2021   CO2 26 10/02/2021   Lab Results  Component Value Date   ALT 20 10/02/2021   AST 18 10/02/2021   ALKPHOS 74 10/02/2021   BILITOT 0.2 10/02/2021   Lab Results  Component Value Date   HGBA1C 6.2 (A) 10/02/2021   HGBA1C 6.5 (H) 06/10/2021   HGBA1C 6.5 (H) 01/28/2021   HGBA1C 6.8 (A) 06/11/2020   HGBA1C 6.7 (A) 02/01/2020   Lab Results  Component Value Date   INSULIN 15.3 06/10/2021   INSULIN 14.9 01/28/2021   INSULIN 25.5 (H) 05/09/2020   Lab Results  Component Value Date   TSH 2.990 10/02/2021   Lab Results  Component Value Date   CHOL 135 06/10/2021   HDL 50 06/10/2021   LDLCALC 61 06/10/2021   TRIG 139 06/10/2021   CHOLHDL 2.5 06/02/2019   Lab Results  Component Value Date   VD25OH 47.9 06/10/2021   VD25OH 58.5 01/28/2021   VD25OH 32.7 05/09/2020   Lab Results  Component Value Date   WBC 6.7 10/02/2021   HGB 13.0 10/02/2021   HCT 37.9 10/02/2021   MCV 95 10/02/2021   PLT 331 10/02/2021   No results found for: IRON, TIBC, FERRITIN  Attestation Statements:   Reviewed by clinician on day of visit: allergies, medications, problem list, medical history, surgical history, family history, social history, and previous encounter notes.  I, April Matthews, RMA, am acting as Location manager for [employer], DO.  I have reviewed the above documentation for accuracy and completeness, and I agree with the above. April Lesch, DO

## 2021-11-19 ENCOUNTER — Encounter (INDEPENDENT_AMBULATORY_CARE_PROVIDER_SITE_OTHER): Payer: Self-pay | Admitting: Bariatrics

## 2021-12-04 ENCOUNTER — Other Ambulatory Visit (INDEPENDENT_AMBULATORY_CARE_PROVIDER_SITE_OTHER): Payer: Self-pay | Admitting: Bariatrics

## 2021-12-04 DIAGNOSIS — J309 Allergic rhinitis, unspecified: Secondary | ICD-10-CM

## 2021-12-04 NOTE — Telephone Encounter (Signed)
LAST APPOINTMENT DATE: 11/18/21 NEXT APPOINTMENT DATE: 12/16/21   [facility] [place], [place] - [address] AT [place] [address] Phone: [phone] Fax: [phone]  [id number] - [place], [place] [address] [place] [address] Phone: [phone] Fax: [phone]  [id number] [place], [place] - [address] [address]  [place] [address] [place] [address] Phone: [phone] Fax: [phone]  [facility] [place] [place] [address] [facility] [address] [place] [address] Phone: [phone] Fax: [place] [address] [place] [address] Phone: [phone] Fax: [phone]  Patient is requesting a refill of the following medications: Pending Prescriptions:                       Disp   Refills   ipratropium (ATROVENT) 0.03 % nasal spray *180 mL         Sig: Use as directed   Date last filled: 09/17/21 Previously prescribed by Dr. Owens Shark  Lab Results      Component                Value               Date                      HGBA1C                   6.2 (A)             10/02/2021                HGBA1C                   6.5 (H)             06/10/2021                HGBA1C                   6.5 (H)             01/28/2021           Lab Results      Component                Value               Date                      MICROALBUR               0.8                 11/27/2016                LDLCALC                  61                  06/10/2021                CREATININE               1.16 (H)            10/02/2021  Lab Results      Component                Value               Date                      VD25OH                   47.9                06/10/2021                VD25OH                   58.5                 01/28/2021                VD25OH                   32.7                05/09/2020            BP Readings from Last 3 Encounters: 11/18/21 : (!) 144/84 10/17/21 : (!) 144/79 10/02/21 : (!) 144/92

## 2021-12-04 NOTE — Telephone Encounter (Signed)
Dr.Brown 

## 2021-12-13 DIAGNOSIS — L4050 Arthropathic psoriasis, unspecified: Secondary | ICD-10-CM | POA: Diagnosis not present

## 2021-12-13 DIAGNOSIS — Z79899 Other long term (current) drug therapy: Secondary | ICD-10-CM | POA: Diagnosis not present

## 2021-12-16 ENCOUNTER — Ambulatory Visit (INDEPENDENT_AMBULATORY_CARE_PROVIDER_SITE_OTHER): Payer: Medicare Other | Admitting: Bariatrics

## 2021-12-17 ENCOUNTER — Encounter (INDEPENDENT_AMBULATORY_CARE_PROVIDER_SITE_OTHER): Payer: Self-pay | Admitting: Bariatrics

## 2021-12-17 ENCOUNTER — Other Ambulatory Visit: Payer: Self-pay

## 2021-12-17 ENCOUNTER — Ambulatory Visit (INDEPENDENT_AMBULATORY_CARE_PROVIDER_SITE_OTHER): Payer: Medicare Other | Admitting: Bariatrics

## 2021-12-17 VITALS — BP 178/81 | HR 70 | Temp 98.0°F | Ht 63.0 in | Wt 208.0 lb

## 2021-12-17 DIAGNOSIS — E669 Obesity, unspecified: Secondary | ICD-10-CM

## 2021-12-17 DIAGNOSIS — I10 Essential (primary) hypertension: Secondary | ICD-10-CM | POA: Diagnosis not present

## 2021-12-17 DIAGNOSIS — E1169 Type 2 diabetes mellitus with other specified complication: Secondary | ICD-10-CM | POA: Diagnosis not present

## 2021-12-17 DIAGNOSIS — E559 Vitamin D deficiency, unspecified: Secondary | ICD-10-CM

## 2021-12-17 DIAGNOSIS — Z6836 Body mass index (BMI) 36.0-36.9, adult: Secondary | ICD-10-CM

## 2021-12-17 DIAGNOSIS — J309 Allergic rhinitis, unspecified: Secondary | ICD-10-CM | POA: Diagnosis not present

## 2021-12-17 MED ORDER — LOSARTAN POTASSIUM 100 MG PO TABS: 100.0000 mg | ORAL_TABLET | Freq: Every day | ORAL | 0 refills | Status: DC

## 2021-12-17 MED ORDER — VITAMIN D (ERGOCALCIFEROL) 1.25 MG (50000 UNIT) PO CAPS: 50000.0000 [IU] | ORAL_CAPSULE | ORAL | 0 refills | Status: DC

## 2021-12-17 NOTE — Progress Notes (Signed)
Chief Complaint:   OBESITY April Matthews is here to discuss her progress with her obesity treatment plan along with follow-up of her obesity related diagnoses. April Matthews is on the Category 1 Plan and states she is following her eating plan approximately 80% of the time. April Matthews states she is bike riding for 1 hour 7 times per week.    Today's visit was #: 24 Starting weight: 205 lbs Starting date: 05/09/2020 Today's weight: 208 lbs Today's date: 12/17/2021 Total lbs lost to date: 0 Total lbs lost since last in-office visit: 1  Interim History: April Matthews is down 1 lb since her last visit. She is getting adequate water.  Subjective:   1. Type 2 diabetes mellitus with other specified complication, without long-term current use of insulin (April Matthews) April Matthews stopped taking Ozempic.  2. Vitamin D deficiency April Matthews is taking Vitamin D as directed.  3. Allergic rhinitis, unspecified seasonality, unspecified trigger April Matthews is working with an Horticulturist, commercial, and she is using Claritin.   4. Essential hypertension [name] is taking Cozaar and Toprol XL. Blood pressures is 178/81. She is checking her blood pressures at home, and they are 315-176'H systolic and highest 93 diastolic.  Assessment/Plan:   1. Type 2 diabetes mellitus with other specified complication, without long-term current use of insulin (HCC) Good blood sugar control is important to decrease the likelihood of diabetic complications such as nephropathy, neuropathy, limb loss, blindness, coronary artery disease, and death. Intensive lifestyle modification including diet, exercise and weight loss are the first line of treatment for diabetes.   2. Vitamin D deficiency Low Vitamin D level contributes to fatigue and are associated with obesity, breast, and colon cancer. We will refill prescription Vitamin D for 90 days with no refills. April Matthews will follow-up for routine testing of Vitamin D, at least 2-3 times per year to avoid over-replacement.  -  Vitamin D, Ergocalciferol, (DRISDOL) 1.25 MG (50000 UNIT) CAPS capsule; Take 1 capsule (50,000 Units total) by mouth every 14 (fourteen) days.  Dispense: 6 capsule; Refill: 0  3. Allergic rhinitis, unspecified seasonality, unspecified trigger April Matthews will continue to take Claritin as needed.  4. Essential hypertension April Matthews agreed to increase Cozaar from 50 mg to 100 mg daily, and we will refill for 90 days with no refills. She is to follow up with her primary care physician. She will continue working on healthy weight loss and exercise to improve blood pressure control.  - losartan (COZAAR) 100 MG tablet; Take 1 tablet (100 mg total) by mouth daily.  Dispense: 90 tablet; Refill: 0  5. Obesity with current of BMI 36.8 April Matthews is currently in the action stage of change. As such, her goal is to continue with weight loss efforts. She has agreed to the Category 1 Plan.   We will recheck fasting labs at her next visit. Meal planning. Will adhere to the plan closely 80-90%. Keep water intake high.  Exercise goals: As is.  Behavioral modification strategies: increasing lean protein intake, decreasing simple carbohydrates, increasing vegetables, increasing water intake, decreasing eating out, no skipping meals, meal planning and cooking strategies, keeping healthy foods in the home, and planning for success.  April Matthews has agreed to follow-up with our clinic in 3 to 4 weeks. She was informed of the importance of frequent follow-up visits to maximize her success with intensive lifestyle modifications for her multiple health conditions.   Objective:   Blood pressure (!) 178/81, pulse 70, temperature 98 F (36.7 C), height 5\' 3"  (1.6 m), weight 208 lb (94.3  kg), SpO2 93 %. Body mass index is 36.85 kg/m.  General: Cooperative, alert, well developed, in no acute distress. HEENT: Conjunctivae and lids unremarkable. Cardiovascular: Regular rhythm.  Lungs: Normal work of breathing. Neurologic: No focal  deficits.   Lab Results  Component Value Date   CREATININE 1.16 (H) 10/02/2021   BUN 26 10/02/2021   NA 143 10/02/2021   K 4.8 10/02/2021   CL 104 10/02/2021   CO2 26 10/02/2021   Lab Results  Component Value Date   ALT 20 10/02/2021   AST 18 10/02/2021   ALKPHOS 74 10/02/2021   BILITOT 0.2 10/02/2021   Lab Results  Component Value Date   HGBA1C 6.2 (A) 10/02/2021   HGBA1C 6.5 (H) 06/10/2021   HGBA1C 6.5 (H) 01/28/2021   HGBA1C 6.8 (A) 06/11/2020   HGBA1C 6.7 (A) 02/01/2020   Lab Results  Component Value Date   INSULIN 15.3 06/10/2021   INSULIN 14.9 01/28/2021   INSULIN 25.5 (H) 05/09/2020   Lab Results  Component Value Date   TSH 2.990 10/02/2021   Lab Results  Component Value Date   CHOL 135 06/10/2021   HDL 50 06/10/2021   LDLCALC 61 06/10/2021   TRIG 139 06/10/2021   CHOLHDL 2.5 06/02/2019   Lab Results  Component Value Date   VD25OH 47.9 06/10/2021   VD25OH 58.5 01/28/2021   VD25OH 32.7 05/09/2020   Lab Results  Component Value Date   WBC 6.7 10/02/2021   HGB 13.0 10/02/2021   HCT 37.9 10/02/2021   MCV 95 10/02/2021   PLT 331 10/02/2021   No results found for: IRON, TIBC, FERRITIN  Attestation Statements:   Reviewed by clinician on day of visit: allergies, medications, problem list, medical history, surgical history, family history, social history, and previous encounter notes.   Wilhemena Durie, am acting as Location manager for [employer], DO.  I have reviewed the above documentation for accuracy and completeness, and I agree with the above. Jearld Lesch, DO

## 2022-01-09 ENCOUNTER — Other Ambulatory Visit (INDEPENDENT_AMBULATORY_CARE_PROVIDER_SITE_OTHER): Payer: Self-pay | Admitting: Adult Health

## 2022-01-09 ENCOUNTER — Ambulatory Visit (INDEPENDENT_AMBULATORY_CARE_PROVIDER_SITE_OTHER): Payer: Medicare Other | Admitting: Adult Health

## 2022-01-09 ENCOUNTER — Other Ambulatory Visit: Payer: Self-pay

## 2022-01-09 ENCOUNTER — Encounter (INDEPENDENT_AMBULATORY_CARE_PROVIDER_SITE_OTHER): Payer: Self-pay | Admitting: Adult Health

## 2022-01-09 VITALS — BP 157/80 | HR 67 | Temp 97.9°F | Ht 63.0 in | Wt 211.0 lb

## 2022-01-09 DIAGNOSIS — Z6837 Body mass index (BMI) 37.0-37.9, adult: Secondary | ICD-10-CM

## 2022-01-09 DIAGNOSIS — I152 Hypertension secondary to endocrine disorders: Secondary | ICD-10-CM | POA: Diagnosis not present

## 2022-01-09 DIAGNOSIS — E1169 Type 2 diabetes mellitus with other specified complication: Secondary | ICD-10-CM | POA: Diagnosis not present

## 2022-01-09 DIAGNOSIS — E559 Vitamin D deficiency, unspecified: Secondary | ICD-10-CM

## 2022-01-09 DIAGNOSIS — E669 Obesity, unspecified: Secondary | ICD-10-CM | POA: Diagnosis not present

## 2022-01-09 DIAGNOSIS — E1159 Type 2 diabetes mellitus with other circulatory complications: Secondary | ICD-10-CM | POA: Diagnosis not present

## 2022-01-09 MED ORDER — LOSARTAN POTASSIUM-HCTZ 100-12.5 MG PO TABS: 1.0000 | ORAL_TABLET | Freq: Every day | ORAL | 0 refills | Status: DC

## 2022-01-09 NOTE — Progress Notes (Signed)
? ? ? ?Chief Complaint:  ? ?OBESITY ?April Matthews is here to discuss her progress with her obesity treatment plan along with follow-up of her obesity related diagnoses. April Matthews is on the Category 1 Plan and states she is following her eating plan approximately 80% of the time. April Matthews states she is biking for 60 minutes 7 times per week. ? ?Today's visit was #: 25 ?Starting weight: 205 lbs ?Starting date: 05/09/2020 ?Today's weight: 211 lbs ?Today's date: 01/09/2022 ?Total lbs lost to date: 0 ?Total lbs lost since last in-office visit: 0 ? ?Interim History:  ?Merdis recently received an infusion for arthropathic psoriasis.   ?BP during tx was 210/110, she was asymptomatic at the time. ? ?Today BP above goal- 157/80, HR 67, also asymptomatic. ?  ?At home she reports her average pressure readings are around- 145/80. ? ?Subjective:  ? ?1. Type 2 diabetes mellitus with other specified complication, without long-term current use of insulin ([place]) ?Currently on metformin XR 500 mg - 3 tablets at dinner. ?She denise GI upset. ? ?2. Hypertension associated with diabetes ([place]) ?10/01/2021, BP above goal - PCP increased losartan 50 mg to 100 mg.   ?On 10/01/2021, EKG - NSR.  No acute cardiac symptoms.   ?She endorses increased lower extremity edema. ?She denies dyspnea. ? ?3. Vitamin D deficiency ?She is currently taking prescription ergocalciferol 50,000 IU each week. She denies nausea, vomiting or muscle weakness. ? ?Assessment/Plan:  ? ?1. Type 2 diabetes mellitus with other specified complication, without long-term current use of insulin ([place]) ?Last dose of Ozempic >6 weeks ago. ?Restart Ozempic 0.25 mg once weekly - hold at this does until office visit. ? ?- Hemoglobin A1c ?- Insulin, random ?- Vitamin B12 ? ?2. Hypertension associated with diabetes ([place]) ?If Red Flag sx's develop- seek immediate medical assistance.  Pt verbalized understanding/agreement. ?Check CMP at next OV ?Change from plain losartan to losartan/HCTZ. ?Start  losartan/HCTZ 100/12.5 mg daily. ? ?- Start losartan-hydrochlorothiazide (HYZAAR) 100-12.5 MG tablet; Take 1 tablet by mouth daily.  Dispense: 30 tablet; Refill: 0 ? ?3. Vitamin D deficiency ?Check vitamin D level today. ? ?- VITAMIN D 25 Hydroxy (Vit-D Deficiency, Fractures) ? ?4. Obesity with current of BMI 37.4 ? ?Casidee is currently in the action stage of change. As such, her goal is to continue with weight loss efforts. She has agreed to the Category 1 Plan.  ? ?Check CMP at next OV ? ?Exercise goals:  As is. ? ?Behavioral modification strategies: increasing lean protein intake, decreasing simple carbohydrates, meal planning and cooking strategies, keeping healthy foods in the home, and planning for success. ? ?Kanesha has agreed to follow-up with our clinic in 3-4 weeks with Dr. Owens Shark. She was informed of the importance of frequent follow-up visits to maximize her success with intensive lifestyle modifications for her multiple health conditions.  ? ?Adaly was informed we would discuss her lab results at her next visit unless there is a critical issue that needs to be addressed sooner. Cheri agreed to keep her next visit at the agreed upon time to discuss these results. ? ?Objective:  ? ?Blood pressure (!) 157/80, pulse 67, temperature 97.9 ?F (36.6 ?C), height '5\' 3"'$  (1.6 m), weight 211 lb (95.7 kg), SpO2 97 %. ?Body mass index is 37.38 kg/m?. ? ?General: Cooperative, alert, well developed, in no acute distress. ?HEENT: Conjunctivae and lids unremarkable. ?Cardiovascular: Regular rhythm.  ?Lungs: Normal work of breathing. ?Neurologic: No focal deficits.  ? ?Lab Results  ?Component Value Date  ? CREATININE 1.16 (  H) 10/02/2021  ? BUN 26 10/02/2021  ? NA 143 10/02/2021  ? K 4.8 10/02/2021  ? CL 104 10/02/2021  ? CO2 26 10/02/2021  ? ?Lab Results  ?Component Value Date  ? ALT 20 10/02/2021  ? AST 18 10/02/2021  ? ALKPHOS 74 10/02/2021  ? BILITOT 0.2 10/02/2021  ? ?Lab Results  ?Component Value Date  ? HGBA1C 6.2  (A) 10/02/2021  ? HGBA1C 6.5 (H) 06/10/2021  ? HGBA1C 6.5 (H) 01/28/2021  ? HGBA1C 6.8 (A) 06/11/2020  ? HGBA1C 6.7 (A) 02/01/2020  ? ?Lab Results  ?Component Value Date  ? INSULIN 15.3 06/10/2021  ? INSULIN 14.9 01/28/2021  ? INSULIN 25.5 (H) 05/09/2020  ? ?Lab Results  ?Component Value Date  ? TSH 2.990 10/02/2021  ? ?Lab Results  ?Component Value Date  ? CHOL 135 06/10/2021  ? HDL 50 06/10/2021  ? [place] 61 06/10/2021  ? TRIG 139 06/10/2021  ? CHOLHDL 2.5 06/02/2019  ? ?Lab Results  ?Component Value Date  ? VD25OH 47.9 06/10/2021  ? VD25OH 58.5 01/28/2021  ? VD25OH 32.7 05/09/2020  ? ?Lab Results  ?Component Value Date  ? WBC 6.7 10/02/2021  ? HGB 13.0 10/02/2021  ? HCT 37.9 10/02/2021  ? MCV 95 10/02/2021  ? PLT 331 10/02/2021  ? ?Obesity Behavioral Intervention:  ? ?Approximately 15 minutes were spent on the discussion below. ? ?ASK: ?We discussed the diagnosis of obesity with Naseem today and Byrdie agreed to give [place] permission to discuss obesity behavioral modification therapy today. ? ?ASSESS: ?Avigail has the diagnosis of obesity and her BMI today is 37.4. Fancy is in the action stage of change.  ? ?ADVISE: ?Jaiah was educated on the multiple health risks of obesity as well as the benefit of weight loss to improve her health. She was advised of the need for long term treatment and the importance of lifestyle modifications to improve her current health and to decrease her risk of future health problems. ? ?AGREE: ?Multiple dietary modification options and treatment options were discussed and Danyel agreed to follow the recommendations documented in the above note. ? ?ARRANGE: ?Trinady was educated on the importance of frequent visits to treat obesity as outlined per CMS and USPSTF guidelines and agreed to schedule her next follow up appointment today. ? ?Attestation Statements:  ? ?Reviewed by clinician on day of visit: allergies, medications, problem list, medical history, surgical history, family history,  social history, and previous encounter notes. ? ?I, Water quality scientist, CMA, am acting as Location manager for Mina Marble, NP. ? ?I have reviewed the above documentation for accuracy and completeness, and I agree with the above. -  Galvin Aversa d. Nicklas Mcsweeney, NP-C ?

## 2022-01-10 LAB — VITAMIN B12: Vitamin B-12: 591 pg/mL (ref 232–1245)

## 2022-01-10 LAB — VITAMIN D 25 HYDROXY (VIT D DEFICIENCY, FRACTURES): Vit D, 25-Hydroxy: 59.8 ng/mL (ref 30.0–100.0)

## 2022-01-10 LAB — HEMOGLOBIN A1C
Est. average glucose Bld gHb Est-mCnc: 148 mg/dL
Hgb A1c MFr Bld: 6.8 % — ABNORMAL HIGH (ref 4.8–5.6)

## 2022-01-10 LAB — INSULIN, RANDOM: INSULIN: 10.2 u[IU]/mL (ref 2.6–24.9)

## 2022-01-30 ENCOUNTER — Encounter (INDEPENDENT_AMBULATORY_CARE_PROVIDER_SITE_OTHER): Payer: Self-pay | Admitting: Bariatrics

## 2022-01-30 ENCOUNTER — Ambulatory Visit (INDEPENDENT_AMBULATORY_CARE_PROVIDER_SITE_OTHER): Payer: Medicare Other | Admitting: Bariatrics

## 2022-01-30 ENCOUNTER — Other Ambulatory Visit (INDEPENDENT_AMBULATORY_CARE_PROVIDER_SITE_OTHER): Payer: Self-pay | Admitting: Bariatrics

## 2022-01-30 VITALS — BP 137/78 | HR 83 | Temp 97.8°F | Ht 63.0 in | Wt 205.0 lb

## 2022-01-30 DIAGNOSIS — E1159 Type 2 diabetes mellitus with other circulatory complications: Secondary | ICD-10-CM | POA: Diagnosis not present

## 2022-01-30 DIAGNOSIS — I152 Hypertension secondary to endocrine disorders: Secondary | ICD-10-CM

## 2022-01-30 DIAGNOSIS — E669 Obesity, unspecified: Secondary | ICD-10-CM | POA: Diagnosis not present

## 2022-01-30 DIAGNOSIS — Z7984 Long term (current) use of oral hypoglycemic drugs: Secondary | ICD-10-CM | POA: Diagnosis not present

## 2022-01-30 DIAGNOSIS — E1169 Type 2 diabetes mellitus with other specified complication: Secondary | ICD-10-CM

## 2022-01-30 DIAGNOSIS — Z6836 Body mass index (BMI) 36.0-36.9, adult: Secondary | ICD-10-CM | POA: Diagnosis not present

## 2022-01-30 MED ORDER — LOSARTAN POTASSIUM-HCTZ 100-12.5 MG PO TABS: 1.0000 | ORAL_TABLET | Freq: Every day | ORAL | 0 refills | Status: DC

## 2022-01-31 LAB — COMPREHENSIVE METABOLIC PANEL
ALT: 17 IU/L (ref 0–32)
AST: 16 IU/L (ref 0–40)
Albumin/Globulin Ratio: 1.8 (ref 1.2–2.2)
Albumin: 4.7 g/dL (ref 3.7–4.7)
Alkaline Phosphatase: 72 IU/L (ref 44–121)
BUN/Creatinine Ratio: 29 — ABNORMAL HIGH (ref 12–28)
BUN: 36 mg/dL — ABNORMAL HIGH (ref 8–27)
Bilirubin Total: 0.3 mg/dL (ref 0.0–1.2)
CO2: 21 mmol/L (ref 20–29)
Calcium: 10.2 mg/dL (ref 8.7–10.3)
Chloride: 105 mmol/L (ref 96–106)
Creatinine, Ser: 1.23 mg/dL — ABNORMAL HIGH (ref 0.57–1.00)
Globulin, Total: 2.6 g/dL (ref 1.5–4.5)
Glucose: 130 mg/dL — ABNORMAL HIGH (ref 70–99)
Potassium: 4.6 mmol/L (ref 3.5–5.2)
Sodium: 141 mmol/L (ref 134–144)
Total Protein: 7.3 g/dL (ref 6.0–8.5)
eGFR: 46 mL/min/{1.73_m2} — ABNORMAL LOW (ref >59)

## 2022-01-31 NOTE — Progress Notes (Signed)
? ? ? ?Chief Complaint:  ? ?OBESITY ?April Matthews is here to discuss her progress with her obesity treatment plan along with follow-up of her obesity related diagnoses. April Matthews is on the Category 1 Plan and states she is following her eating plan approximately 60-80% of the time. April Matthews states she is doing 0 minutes 0 times per week. ? ?Today's visit was #: 26 ?Starting weight: 205 lbs ?Starting date: 05/09/2020 ?Today's weight: 205 lbs ?Today's date: 01/30/2022 ?Total lbs lost to date: 0 ?Total lbs lost since last in-office visit: 0 ? ?Interim History: April Matthews is down 6 lbs since her last visit.  ? ?Subjective:  ? ?1. Hypertension associated with diabetes ([place]) ?April Matthews's last visit losartan was changed to losartan/HCTZ increased dose up from 50 mg to 100 mg.  ? ?2. Diabetes mellitus type 2 in obese April Matthews) ?April Matthews is currently taking Metformin XR and Ozempic.  ? ?Assessment/Plan:  ? ?1. Hypertension associated with diabetes ([place]) ?We will check CMP due to medication change at last visit. We will refill losartan/HCTZ 100 mg for 1 month with no refills. Her blood pressure was much improved at home. April Matthews is working on healthy weight loss and exercise to improve blood pressure control. We will watch for signs of hypotension as she continues her lifestyle modifications. ? ?- losartan-hydrochlorothiazide (HYZAAR) 100-12.5 MG tablet; Take 1 tablet by mouth daily.  Dispense: 30 tablet; Refill: 0 ?- Comprehensive metabolic panel ? ?2. Diabetes mellitus type 2 in obese [facility]) ?Kinaya will continue Metformin XR and Ozempic. She will decrease carbohydrates. Good blood sugar control is important to decrease the likelihood of diabetic complications such as nephropathy, neuropathy, limb loss, blindness, coronary artery disease, and death. Intensive lifestyle modification including diet, exercise and weight loss are the first line of treatment for diabetes.  ? ?3. Obesity with current of BMI 36.3 ?April Matthews is currently in the action stage of  change. As such, her goal is to continue with weight loss efforts. She has agreed to the Category 1 Plan.  ? ?April Matthews will continue meal planning. She will adhere closely to the plan 80-90%. She will increase her water intake and have less tea.  ? ?Exercise goals:  April Matthews will start back with exercise slowly and progress slowly.  ? ?Behavioral modification strategies: increasing lean protein intake, decreasing simple carbohydrates, increasing vegetables, increasing water intake, decreasing eating out, no skipping meals, meal planning and cooking strategies, keeping healthy foods in the home, and planning for success. ? ?April Matthews has agreed to follow-up with our clinic in 3 weeks with April Marble, NP and 6 weeks with myself. She was informed of the importance of frequent follow-up visits to maximize her success with intensive lifestyle modifications for her multiple health conditions.  ? ?Objective:  ? ?Blood pressure 137/78, pulse 83, temperature 97.8 ?F (36.6 ?C), height '5\' 3"'$  (1.6 m), weight 205 lb (93 kg), SpO2 97 %. ?Body mass index is 36.31 kg/m?. ? ?General: Cooperative, alert, well developed, in no acute distress. ?HEENT: Conjunctivae and lids unremarkable. ?Cardiovascular: Regular rhythm.  ?Lungs: Normal work of breathing. ?Neurologic: No focal deficits.  ? ?Lab Results  ?Component Value Date  ? CREATININE 1.23 (H) 01/30/2022  ? BUN 36 (H) 01/30/2022  ? NA 141 01/30/2022  ? K 4.6 01/30/2022  ? CL 105 01/30/2022  ? CO2 21 01/30/2022  ? ?Lab Results  ?Component Value Date  ? ALT 17 01/30/2022  ? AST 16 01/30/2022  ? ALKPHOS 72 01/30/2022  ? BILITOT 0.3 01/30/2022  ? ?Lab Results  ?  Component Value Date  ? HGBA1C 6.8 (H) 01/09/2022  ? HGBA1C 6.2 (A) 10/02/2021  ? HGBA1C 6.5 (H) 06/10/2021  ? HGBA1C 6.5 (H) 01/28/2021  ? HGBA1C 6.8 (A) 06/11/2020  ? ?Lab Results  ?Component Value Date  ? INSULIN 10.2 01/09/2022  ? INSULIN 15.3 06/10/2021  ? INSULIN 14.9 01/28/2021  ? INSULIN 25.5 (H) 05/09/2020  ? ?Lab Results   ?Component Value Date  ? TSH 2.990 10/02/2021  ? ?Lab Results  ?Component Value Date  ? CHOL 135 06/10/2021  ? HDL 50 06/10/2021  ? [place] 61 06/10/2021  ? TRIG 139 06/10/2021  ? CHOLHDL 2.5 06/02/2019  ? ?Lab Results  ?Component Value Date  ? VD25OH 59.8 01/09/2022  ? VD25OH 47.9 06/10/2021  ? VD25OH 58.5 01/28/2021  ? ?Lab Results  ?Component Value Date  ? WBC 6.7 10/02/2021  ? HGB 13.0 10/02/2021  ? HCT 37.9 10/02/2021  ? MCV 95 10/02/2021  ? PLT 331 10/02/2021  ? ?No results found for: IRON, TIBC, FERRITIN ? ?Attestation Statements:  ? ?Reviewed by clinician on day of visit: allergies, medications, problem list, medical history, surgical history, family history, social history, and previous encounter notes. ? ?I, Lizbeth Bark, RMA, am acting as transcriptionist for [employer], DO. ? ?I have reviewed the above documentation for accuracy and completeness, and I agree with the above. Jearld Lesch, DO ? ?

## 2022-02-04 ENCOUNTER — Encounter (INDEPENDENT_AMBULATORY_CARE_PROVIDER_SITE_OTHER): Payer: Self-pay | Admitting: Bariatrics

## 2022-02-07 DIAGNOSIS — Z111 Encounter for screening for respiratory tuberculosis: Secondary | ICD-10-CM | POA: Diagnosis not present

## 2022-02-07 DIAGNOSIS — L4050 Arthropathic psoriasis, unspecified: Secondary | ICD-10-CM | POA: Diagnosis not present

## 2022-02-07 DIAGNOSIS — Z79899 Other long term (current) drug therapy: Secondary | ICD-10-CM | POA: Diagnosis not present

## 2022-02-07 DIAGNOSIS — R5383 Other fatigue: Secondary | ICD-10-CM | POA: Diagnosis not present

## 2022-02-10 ENCOUNTER — Other Ambulatory Visit (INDEPENDENT_AMBULATORY_CARE_PROVIDER_SITE_OTHER): Payer: Self-pay | Admitting: Adult Health

## 2022-02-10 DIAGNOSIS — I152 Hypertension secondary to endocrine disorders: Secondary | ICD-10-CM

## 2022-03-05 ENCOUNTER — Ambulatory Visit (INDEPENDENT_AMBULATORY_CARE_PROVIDER_SITE_OTHER): Payer: Medicare Other | Admitting: Adult Health

## 2022-03-05 ENCOUNTER — Encounter (INDEPENDENT_AMBULATORY_CARE_PROVIDER_SITE_OTHER): Payer: Self-pay | Admitting: Adult Health

## 2022-03-05 VITALS — BP 123/81 | HR 71 | Temp 97.9°F | Ht 63.0 in | Wt 204.0 lb

## 2022-03-05 DIAGNOSIS — I152 Hypertension secondary to endocrine disorders: Secondary | ICD-10-CM | POA: Diagnosis not present

## 2022-03-05 DIAGNOSIS — E669 Obesity, unspecified: Secondary | ICD-10-CM

## 2022-03-05 DIAGNOSIS — E1169 Type 2 diabetes mellitus with other specified complication: Secondary | ICD-10-CM | POA: Diagnosis not present

## 2022-03-05 DIAGNOSIS — N189 Chronic kidney disease, unspecified: Secondary | ICD-10-CM

## 2022-03-05 DIAGNOSIS — Z7985 Long-term (current) use of injectable non-insulin antidiabetic drugs: Secondary | ICD-10-CM | POA: Diagnosis not present

## 2022-03-05 DIAGNOSIS — Z6836 Body mass index (BMI) 36.0-36.9, adult: Secondary | ICD-10-CM | POA: Diagnosis not present

## 2022-03-05 DIAGNOSIS — E1159 Type 2 diabetes mellitus with other circulatory complications: Secondary | ICD-10-CM | POA: Diagnosis not present

## 2022-03-05 MED ORDER — OZEMPIC (0.25 OR 0.5 MG/DOSE) 2 MG/1.5ML ~~LOC~~ SOPN: 0.5000 mg | PEN_INJECTOR | SUBCUTANEOUS | 0 refills | Status: DC

## 2022-03-07 ENCOUNTER — Other Ambulatory Visit (INDEPENDENT_AMBULATORY_CARE_PROVIDER_SITE_OTHER): Payer: Self-pay | Admitting: Bariatrics

## 2022-03-07 DIAGNOSIS — E1159 Type 2 diabetes mellitus with other circulatory complications: Secondary | ICD-10-CM

## 2022-03-10 ENCOUNTER — Other Ambulatory Visit (INDEPENDENT_AMBULATORY_CARE_PROVIDER_SITE_OTHER): Payer: Self-pay | Admitting: Adult Health

## 2022-03-10 DIAGNOSIS — E1159 Type 2 diabetes mellitus with other circulatory complications: Secondary | ICD-10-CM

## 2022-03-10 MED ORDER — LOSARTAN POTASSIUM-HCTZ 100-12.5 MG PO TABS: 1.0000 | ORAL_TABLET | Freq: Every day | ORAL | 0 refills | Status: DC

## 2022-03-10 NOTE — Telephone Encounter (Signed)
LAST APPOINTMENT DATE: 03/05/22 ?NEXT APPOINTMENT DATE: 03/26/22 ? ? ?[facility] [place] ?[place] [address] ?Phone: [phone] Fax: [phone] ? ?[id number] [place] [place] - [address]?Phone: [phone]: [phone]? ?[id number] [place] [id number] ? [address] ?[place]?Phone: [phone]: [phone]? ?[facility] [address] ?[place]?Phone: [phone]: [phone]? ?[facility]?Phone: [phone]: [phone]? ?Patient is requesting a refill of the following medications: ?Pending Prescriptions:                       Disp   Refills ?  losartan-hydrochlorothiazide (HYZAAR) 100-*30 tab*0       ?Sig: Take 1 tablet by mouth daily. ? ? ?Date last filled: 01/30/22 ?Previously prescribed by Dr.Brown ? ?Lab Results ?     Component                Value               Date                 ?     HGBA1C                   6.8 (H)             01/09/2022           ?     HGBA1C                   6.2 (A)             10/02/2021           ?     HGBA1C                   6.5 (H)             06/10/2021           ?Lab Results ?     Component                Value               Date                 ?     MICROALBUR               0.8                 11/27/2016           ?     [place]                 61                  06/10/2021           ?     CREATININE               1.23 (H)            01/30/2022           ?  Lab Results ?     Component                Value               Date                 ?     VD25OH                   59.8                01/09/2022           ?     VD25OH                    47.9                06/10/2021           ?     VD25OH                   58.5                01/28/2021           ? ?BP Readings from Last 3 Encounters: ?03/05/22 : 123/81 ?01/30/22 : 137/78 ?01/09/22 : Marland Kitchen 157/80 ?

## 2022-03-10 NOTE — Progress Notes (Signed)
? ? ? ?Chief Complaint:  ? ?OBESITY ?Ronae is here to discuss her progress with her obesity treatment plan along with follow-up of her obesity related diagnoses. Girtha is on the Category 1 Plan and states she is following her eating plan approximately 80% of the time. Trissa states she is exercising 0 minutes 0 times per week. ? ?Today's visit was #: 10 ?Starting weight: 205 lbs  ?Starting date: 05/09/2020 ?Today's weight: 204 lbs ?Today's date: 03/05/2022 ?Total lbs lost to date: 1 ?Total lbs lost since last in-office visit: 1 ? ?Interim History:  ?Rut is recovering from recent URI. ?She dyspnea at present. ?She denies known exposure to COVID-19. ? ?Subjective:  ? ?1. Hypertension associated with diabetes ([place]) ?Discussed labs with patient today.  ?Denetria's blood pressure and heart rate at goal in office today.Marland Kitchen ?On 01/30/22 CMP-electrolytes stable. ?Increase in Creat- 1.23 ?Decrease in GFR- 46 ? ?2. Type 2 diabetes mellitus with other specified complication, without long-term current use of insulin ([place]) ?Discussed labs with patient today.  ?01/30/22  A1c 6.8 at goal. ?Taniah is currently taking Metformin XR 500 mg, 3 tablets at breakfast = 1500 mg daily-managed by PCP.  ?Kidney fx worsening on lat CMP. ?She restarted on 3/16/20263 Ozempic 0.25 mg, she has had 5 doses at this strength. ?She is not checking BG at home.  ? ?3. Chronic kidney disease, unspecified CKD stage ?Discussed labs with patient today.  ?On 01/30/2022: Creat increased, while GFR decreased. ? ?Assessment/Plan:  ? ?1. Hypertension associated with diabetes ([place]) ?We will refill Losartan/HCTZ 10/12.5 mg daily for 1 month with no refills. ? ?2. Type 2 diabetes mellitus with other specified complication, without long-term current use of insulin ([place]) ?We will refill increased Ozempic 0.5 mg once a week for 1 month with no refills.  ?Decrease in Metformin XR 500 mg, 2 tablets at night. ? ?RF Ozempic 0.'5mg'$  once week, disp 68m RF 0 ? ?3. Chronic kidney  disease, unspecified CKD stage ?Decrease in Metformin XR 500 mg she is to avoid NSAID's. ? ?4. Obesity with current of BMI 36.3 ?Fia is currently in the action stage of change. As such, her goal is to continue with weight loss efforts. She has agreed to the Category 1 Plan.  ? ?Exercise goals: All adults should avoid inactivity. Some physical activity is better than none, and adults who participate in any amount of physical activity gain some health benefits. ? ?Behavioral modification strategies: increasing lean protein intake, decreasing simple carbohydrates, meal planning and cooking strategies, keeping healthy foods in the home, and planning for success. ? ?STawanahas agreed to follow-up with our clinic in 4 weeks. She was informed of the importance of frequent follow-up visits to maximize her success with intensive lifestyle modifications for her multiple health conditions.  ? ?Objective:  ? ?Blood pressure 123/81, pulse 71, temperature 97.9 ?F (36.6 ?C), height '5\' 3"'$  (1.6 m), weight 204 lb (92.5 kg), SpO2 95 %. ?Body mass index is 36.14 kg/m?. ? ?General: Cooperative, alert, well developed, in no acute distress. ?HEENT: Conjunctivae and lids unremarkable. ?Cardiovascular: Regular rhythm.  ?Lungs: Normal work of breathing. ?Neurologic: No focal deficits.  ? ?Lab Results  ?Component Value Date  ? CREATININE 1.23 (H) 01/30/2022  ? BUN 36 (H) 01/30/2022  ? NA 141 01/30/2022  ? K 4.6 01/30/2022  ? CL 105 01/30/2022  ? CO2 21 01/30/2022  ? ?Lab Results  ?Component Value Date  ? ALT 17 01/30/2022  ? AST 16 01/30/2022  ? ALKPHOS 72 01/30/2022  ?  BILITOT 0.3 01/30/2022  ? ?Lab Results  ?Component Value Date  ? HGBA1C 6.8 (H) 01/09/2022  ? HGBA1C 6.2 (A) 10/02/2021  ? HGBA1C 6.5 (H) 06/10/2021  ? HGBA1C 6.5 (H) 01/28/2021  ? HGBA1C 6.8 (A) 06/11/2020  ? ?Lab Results  ?Component Value Date  ? INSULIN 10.2 01/09/2022  ? INSULIN 15.3 06/10/2021  ? INSULIN 14.9 01/28/2021  ? INSULIN 25.5 (H) 05/09/2020  ? ?Lab Results   ?Component Value Date  ? TSH 2.990 10/02/2021  ? ?Lab Results  ?Component Value Date  ? CHOL 135 06/10/2021  ? HDL 50 06/10/2021  ? [place] 61 06/10/2021  ? TRIG 139 06/10/2021  ? CHOLHDL 2.5 06/02/2019  ? ?Lab Results  ?Component Value Date  ? VD25OH 59.8 01/09/2022  ? VD25OH 47.9 06/10/2021  ? VD25OH 58.5 01/28/2021  ? ?Lab Results  ?Component Value Date  ? WBC 6.7 10/02/2021  ? HGB 13.0 10/02/2021  ? HCT 37.9 10/02/2021  ? MCV 95 10/02/2021  ? PLT 331 10/02/2021  ? ?No results found for: IRON, TIBC, FERRITIN ? ?Obesity Behavioral Intervention:  ? ?Approximately 15 minutes were spent on the discussion below. ? ?ASK: ?We discussed the diagnosis of obesity with Berlyn today and Sallyann agreed to give [place] permission to discuss obesity behavioral modification therapy today. ? ?ASSESS: ?Priscilla has the diagnosis of obesity and her BMI today is 36.6. Victoire is in the action stage of change.  ? ?ADVISE: ?Nyrah was educated on the multiple health risks of obesity as well as the benefit of weight loss to improve her health. She was advised of the need for long term treatment and the importance of lifestyle modifications to improve her current health and to decrease her risk of future health problems. ? ?AGREE: ?Multiple dietary modification options and treatment options were discussed and Keileigh agreed to follow the recommendations documented in the above note. ? ?ARRANGE: ?Idabelle was educated on the importance of frequent visits to treat obesity as outlined per CMS and USPSTF guidelines and agreed to schedule her next follow up appointment today. ? ?Attestation Statements:  ? ?Reviewed by clinician on day of visit: allergies, medications, problem list, medical history, surgical history, family history, social history, and previous encounter notes. ? ?I, Brendell Tyus, RMA, am acting as transcriptionist for Mina Marble, NP. ? ?I have reviewed the above documentation for accuracy and completeness, and I agree with the  above. - Byanka Landrus d. Velecia Ovitt, NP-C ?

## 2022-03-11 DIAGNOSIS — N189 Chronic kidney disease, unspecified: Secondary | ICD-10-CM | POA: Insufficient documentation

## 2022-03-23 ENCOUNTER — Other Ambulatory Visit (INDEPENDENT_AMBULATORY_CARE_PROVIDER_SITE_OTHER): Payer: Self-pay | Admitting: Bariatrics

## 2022-03-23 DIAGNOSIS — I10 Essential (primary) hypertension: Secondary | ICD-10-CM

## 2022-03-26 ENCOUNTER — Other Ambulatory Visit (INDEPENDENT_AMBULATORY_CARE_PROVIDER_SITE_OTHER): Payer: Self-pay | Admitting: Bariatrics

## 2022-03-26 ENCOUNTER — Ambulatory Visit (INDEPENDENT_AMBULATORY_CARE_PROVIDER_SITE_OTHER): Payer: Medicare Other | Admitting: Bariatrics

## 2022-03-26 ENCOUNTER — Encounter (INDEPENDENT_AMBULATORY_CARE_PROVIDER_SITE_OTHER): Payer: Self-pay | Admitting: Bariatrics

## 2022-03-26 VITALS — BP 106/69 | HR 78 | Temp 97.5°F | Ht 63.0 in | Wt 205.0 lb

## 2022-03-26 DIAGNOSIS — E669 Obesity, unspecified: Secondary | ICD-10-CM

## 2022-03-26 DIAGNOSIS — Z7985 Long-term (current) use of injectable non-insulin antidiabetic drugs: Secondary | ICD-10-CM

## 2022-03-26 DIAGNOSIS — E1169 Type 2 diabetes mellitus with other specified complication: Secondary | ICD-10-CM | POA: Diagnosis not present

## 2022-03-26 DIAGNOSIS — I152 Hypertension secondary to endocrine disorders: Secondary | ICD-10-CM

## 2022-03-26 DIAGNOSIS — Z6836 Body mass index (BMI) 36.0-36.9, adult: Secondary | ICD-10-CM

## 2022-03-26 DIAGNOSIS — E1159 Type 2 diabetes mellitus with other circulatory complications: Secondary | ICD-10-CM

## 2022-03-26 MED ORDER — LOSARTAN POTASSIUM-HCTZ 100-12.5 MG PO TABS: 1.0000 | ORAL_TABLET | Freq: Every day | ORAL | 0 refills | Status: DC

## 2022-03-26 MED ORDER — OZEMPIC (0.25 OR 0.5 MG/DOSE) 2 MG/1.5ML ~~LOC~~ SOPN: 0.5000 mg | PEN_INJECTOR | SUBCUTANEOUS | 0 refills | Status: DC

## 2022-03-27 ENCOUNTER — Other Ambulatory Visit (INDEPENDENT_AMBULATORY_CARE_PROVIDER_SITE_OTHER): Payer: Self-pay | Admitting: Bariatrics

## 2022-03-27 DIAGNOSIS — E1169 Type 2 diabetes mellitus with other specified complication: Secondary | ICD-10-CM

## 2022-03-27 MED ORDER — OZEMPIC (0.25 OR 0.5 MG/DOSE) 2 MG/3ML ~~LOC~~ SOPN: PEN_INJECTOR | SUBCUTANEOUS | 0 refills | Status: DC

## 2022-03-28 NOTE — Progress Notes (Signed)
Chief Complaint:   OBESITY April Matthews is here to discuss her progress with her obesity treatment plan along with follow-up of her obesity related diagnoses. April Matthews is on the Category 1 Plan and states she is following her eating plan approximately 75% of the time. April Matthews states she is walking for 30 minutes 5 times per week.  Today's visit was #: 28 Starting weight: 205 lbs Starting date: 05/09/2020 Today's weight: 205 lbs Today's date: 03/26/2022 Total lbs lost to date: 0 Total lbs lost since last in-office visit: 0  Interim History: April Matthews is up 1 lb since her last visit. She is getting adequate water and protein.   Subjective:   1. Type 2 diabetes mellitus with other specified complication, without long-term current use of insulin ([place]) April Matthews is tolerating Ozempic well. She is currently taking Ozempic and Metformin.   2. Hypertension associated with diabetes ([place]) April Matthews is taking Hyzaar currently.   Assessment/Plan:   1. Type 2 diabetes mellitus with other specified complication, without long-term current use of insulin (HCC) We will refill Ozempic 0.25 mg for 1 month with no refills. Good blood sugar control is important to decrease the likelihood of diabetic complications such as nephropathy, neuropathy, limb loss, blindness, coronary artery disease, and death. Intensive lifestyle modification including diet, exercise and weight loss are the first line of treatment for diabetes.   2. Hypertension associated with diabetes ([place]) We will refill Hyzaar 100-12.5 mg for 1 month with no refills. Mozella is working on healthy weight loss and exercise to improve blood pressure control. We will watch for signs of hypotension as she continues her lifestyle modifications.  - losartan-hydrochlorothiazide (HYZAAR) 100-12.5 MG tablet; Take 1 tablet by mouth daily.  Dispense: 30 tablet; Refill: 0  3. Obesity, Current BMI 36.3 April Matthews is currently in the action stage of change. As such, her  goal is to continue with weight loss efforts. She has agreed to the Category 1 Plan.   April Matthews will continue meal planning and she will continue intentional eating.   Exercise goals:  As is. April Matthews will renew swimming and riding the bike.   Behavioral modification strategies: increasing lean protein intake, decreasing simple carbohydrates, increasing vegetables, increasing water intake, decreasing eating out, no skipping meals, meal planning and cooking strategies, keeping healthy foods in the home, and planning for success.  April Matthews has agreed to follow-up with our clinic in 4 weeks with Faith Rogue, NP and 8 weeks with myself. She was informed of the importance of frequent follow-up visits to maximize her success with intensive lifestyle modifications for her multiple health conditions.   Objective:   Blood pressure 106/69, pulse 78, temperature (!) 97.5 F (36.4 C), height '5\' 3"'$  (1.6 m), weight 205 lb (93 kg), SpO2 94 %. Body mass index is 36.31 kg/m.  General: Cooperative, alert, well developed, in no acute distress. HEENT: Conjunctivae and lids unremarkable. Cardiovascular: Regular rhythm.  Lungs: Normal work of breathing. Neurologic: No focal deficits.   Lab Results  Component Value Date   CREATININE 1.23 (H) 01/30/2022   BUN 36 (H) 01/30/2022   NA 141 01/30/2022   K 4.6 01/30/2022   CL 105 01/30/2022   CO2 21 01/30/2022   Lab Results  Component Value Date   ALT 17 01/30/2022   AST 16 01/30/2022   ALKPHOS 72 01/30/2022   BILITOT 0.3 01/30/2022   Lab Results  Component Value Date   HGBA1C 6.8 (H) 01/09/2022   HGBA1C 6.2 (A) 10/02/2021   HGBA1C 6.5 (  H) 06/10/2021   HGBA1C 6.5 (H) 01/28/2021   HGBA1C 6.8 (A) 06/11/2020   Lab Results  Component Value Date   INSULIN 10.2 01/09/2022   INSULIN 15.3 06/10/2021   INSULIN 14.9 01/28/2021   INSULIN 25.5 (H) 05/09/2020   Lab Results  Component Value Date   TSH 2.990 10/02/2021   Lab Results  Component Value Date    CHOL 135 06/10/2021   HDL 50 06/10/2021   LDLCALC 61 06/10/2021   TRIG 139 06/10/2021   CHOLHDL 2.5 06/02/2019   Lab Results  Component Value Date   VD25OH 59.8 01/09/2022   VD25OH 47.9 06/10/2021   VD25OH 58.5 01/28/2021   Lab Results  Component Value Date   WBC 6.7 10/02/2021   HGB 13.0 10/02/2021   HCT 37.9 10/02/2021   MCV 95 10/02/2021   PLT 331 10/02/2021   No results found for: IRON, TIBC, FERRITIN  Attestation Statements:   Reviewed by clinician on day of visit: allergies, medications, problem list, medical history, surgical history, family history, social history, and previous encounter notes.  I, Lizbeth Bark, RMA, am acting as Location manager for [employer], DO.  I have reviewed the above documentation for accuracy and completeness, and I agree with the above. Jearld Lesch, DO

## 2022-03-31 ENCOUNTER — Other Ambulatory Visit: Payer: Self-pay | Admitting: Family Medicine

## 2022-03-31 DIAGNOSIS — E7800 Pure hypercholesterolemia, unspecified: Secondary | ICD-10-CM

## 2022-04-02 ENCOUNTER — Telehealth (INDEPENDENT_AMBULATORY_CARE_PROVIDER_SITE_OTHER): Payer: Self-pay

## 2022-04-02 ENCOUNTER — Encounter (INDEPENDENT_AMBULATORY_CARE_PROVIDER_SITE_OTHER): Payer: Self-pay

## 2022-04-02 ENCOUNTER — Encounter (INDEPENDENT_AMBULATORY_CARE_PROVIDER_SITE_OTHER): Payer: Self-pay | Admitting: Bariatrics

## 2022-04-02 NOTE — Telephone Encounter (Signed)
Pharmacy is stating patient needs a prior authorization for Ozempic. Can you please advise?

## 2022-04-02 NOTE — Telephone Encounter (Signed)
Patient sent mychart message requesting a copy of pharmacy prescription drug card to process prior authorization for Ozempic. We don't have a current copy on file. Once patient sends this information via mychart, prior authorization will be processed. Thanks!

## 2022-04-02 NOTE — Telephone Encounter (Signed)
Notified patient per her chart that Ozempic was sent to the pharmacy by Dr. Owens Shark. Called [facility] to verify and was told medication needed a PA. Notified patient a PA will be started and she will be notified once it was complete and has a determination from insurance. Patient verbalized understanding.

## 2022-04-02 NOTE — Telephone Encounter (Signed)
Patient called and stated her Ozempic was not sent to the pharmacy.

## 2022-04-03 ENCOUNTER — Ambulatory Visit (INDEPENDENT_AMBULATORY_CARE_PROVIDER_SITE_OTHER): Payer: Medicare Other | Admitting: Adult Health

## 2022-04-04 DIAGNOSIS — L4050 Arthropathic psoriasis, unspecified: Secondary | ICD-10-CM | POA: Diagnosis not present

## 2022-04-09 ENCOUNTER — Telehealth (INDEPENDENT_AMBULATORY_CARE_PROVIDER_SITE_OTHER): Payer: Self-pay | Admitting: Bariatrics

## 2022-04-09 ENCOUNTER — Encounter (INDEPENDENT_AMBULATORY_CARE_PROVIDER_SITE_OTHER): Payer: Self-pay

## 2022-04-09 NOTE — Telephone Encounter (Signed)
Dr. Owens Shark - Prior authorization approved for Ozempic. Effective: 04/09/2022 - 04/10/2023. Patient sent approval message via mychart.

## 2022-04-11 ENCOUNTER — Ambulatory Visit (INDEPENDENT_AMBULATORY_CARE_PROVIDER_SITE_OTHER): Payer: Medicare Other | Admitting: Medical

## 2022-04-11 VITALS — BP 120/80 | HR 79 | Temp 97.5°F | Wt 210.2 lb

## 2022-04-11 DIAGNOSIS — L401 Generalized pustular psoriasis: Secondary | ICD-10-CM | POA: Diagnosis not present

## 2022-04-11 DIAGNOSIS — M79605 Pain in left leg: Secondary | ICD-10-CM | POA: Diagnosis not present

## 2022-04-11 DIAGNOSIS — L4050 Arthropathic psoriasis, unspecified: Secondary | ICD-10-CM | POA: Diagnosis not present

## 2022-04-11 DIAGNOSIS — W19XXXA Unspecified fall, initial encounter: Secondary | ICD-10-CM

## 2022-04-11 DIAGNOSIS — R7989 Other specified abnormal findings of blood chemistry: Secondary | ICD-10-CM | POA: Diagnosis not present

## 2022-04-11 DIAGNOSIS — M1991 Primary osteoarthritis, unspecified site: Secondary | ICD-10-CM | POA: Diagnosis not present

## 2022-04-11 DIAGNOSIS — E559 Vitamin D deficiency, unspecified: Secondary | ICD-10-CM | POA: Diagnosis not present

## 2022-04-11 DIAGNOSIS — E669 Obesity, unspecified: Secondary | ICD-10-CM | POA: Diagnosis not present

## 2022-04-11 DIAGNOSIS — Z79899 Other long term (current) drug therapy: Secondary | ICD-10-CM | POA: Diagnosis not present

## 2022-04-11 DIAGNOSIS — S20212A Contusion of left front wall of thorax, initial encounter: Secondary | ICD-10-CM

## 2022-04-11 DIAGNOSIS — Z6836 Body mass index (BMI) 36.0-36.9, adult: Secondary | ICD-10-CM | POA: Diagnosis not present

## 2022-04-11 MED ORDER — IPRATROPIUM BROMIDE 0.03 % NA SOLN: 2.0000 | Freq: Two times a day (BID) | NASAL | 1 refills | Status: DC

## 2022-04-11 NOTE — Progress Notes (Signed)
Subjective:  April Matthews is a 76 y.o. female who presents for Chief Complaint  Patient presents with   fell last week    [place] last week and hurt ribs,hurts on left side near ribs.      Here for fall 04/03/22.  [place] forward last week, fell on left side and hands.  Sort of knocked the breath out of her.  Was moving a piece of furniture and she tripped on a rug.   Landed on her left side.  Didn't have any bruising.  No head injury, no LOC.   Been using 2 tylenol twice daily.  Mainly feels the pain in left ribs if sleeping or if cough.  Her daughter is a Designer, jewellery, wanted her to come and get checked out.  Doesn't think she broke anything, thinks she bruised a rib.  No arm pain, no numbness or tingling.   No problems breathing.  No blood in urine.   No skin change or bruising on chest wall.  Landed on hard surface, not carpet. She is pleasantly surprised she didn't break any bone such as in her hands.    No other aggravating or relieving factors.    No other c/o.  Past Medical History:  Diagnosis Date   Abnormal mammogram 9/07   Right breast--bx sclerosing papilloma   Abnormal mammogram 02/2009   Left; suspicious abnormality L breast mammo and u/s.  Biopsy recommended but patient never did nor f/u mammo   Abnormal TSH 2015   negative Ab's; borderline on repeat   Arthritis    psoriatic - digits   Diabetes type 2, controlled ([place])    A1c 6.5 07/2014 (had been prediabetic prior)   Diverticulosis 07/2014   mild, noted on colonoscopy   DM (diabetes mellitus), gestational    with first pregnancy (stillborn)--based on autopsy; not diagnosed during pregnancy   Edema of both lower extremities    Food allergy    GERD (gastroesophageal reflux disease)    Heart murmur    History of kidney stones    HTN (hypertension)    Joint pain    Kidney stone 1997   Lactose intolerance    Obese    Osteopenia 7/07   mild   Psoriatic arthritis ([place])    on Stelara (Dr. Amil Amen); previously took  Remicade   Seasonal allergies    Seasonal allergies    Varicose veins    Venous insufficiency    Vitamin D deficiency 12/09   Current Outpatient Medications on File Prior to Visit  Medication Sig Dispense Refill   atorvastatin (LIPITOR) 20 MG tablet TAKE 1 TABLET(20 MG) BY MOUTH DAILY 90 tablet 0   calcium carbonate (TUMS - DOSED IN MG ELEMENTAL CALCIUM) 500 MG chewable tablet Chew 1 tablet by mouth daily.     fluticasone (FLONASE) 50 MCG/ACT nasal spray Place 1 spray into both nostrils daily.     losartan-hydrochlorothiazide (HYZAAR) 100-12.5 MG tablet Take 1 tablet by mouth daily. 30 tablet 0   metFORMIN (GLUCOPHAGE-XR) 500 MG 24 hr tablet TAKE 3 TABLETS BY MOUTH WITH BREAKFAST (Patient taking differently: TAKE 2 TABLETS BY MOUTH AT NIGHT) 270 tablet 3   metoprolol succinate (TOPROL-XL) 25 MG 24 hr tablet Take 1 tablet (25 mg total) by mouth daily. 90 tablet 3   Multiple Vitamin (MULTIVITAMIN) tablet Take 1 tablet by mouth daily.     Semaglutide,0.25 or 0.'5MG'$ /DOS, (OZEMPIC, 0.25 OR 0.5 MG/DOSE,) 2 MG/3ML SOPN 0.5 mg into the skin weekly 3 mL 0  vitamin B-12 (CYANOCOBALAMIN) 1000 MCG tablet Take 1,000 mcg by mouth daily.     Vitamin D, Ergocalciferol, (DRISDOL) 1.25 MG (50000 UNIT) CAPS capsule Take 1 capsule (50,000 Units total) by mouth every 14 (fourteen) days. 6 capsule 0   No current facility-administered medications on file prior to visit.     The following portions of the patient's history were reviewed and updated as appropriate: allergies, current medications, past family history, past medical history, past social history, past surgical history and problem list.  ROS Otherwise as in subjective above  Objective: BP 120/80   Pulse 79   Temp (!) 97.5 F (36.4 C)   Wt 210 lb 3.2 oz (95.3 kg)   BMI 37.24 kg/m   General appearance: alert, no distress, well developed, well nourished HEENT: normocephalic, no obvious facial injury or deformity Neck: supple, no  lymphadenopathy, no thyromegaly, no masses Heart: RRR, normal S1, S2, no murmurs Lungs: CTA bilaterally, no wheezes, rhonchi, or rales Left anterior lateral inferior chest wall, anterior rib region with some tenderness to palpation mildly but no deformity, no swelling, no abnormal chest wall motion with breathing Abdomen: +bs, soft, non tender, non distended, no masses, no hepatomegaly, no splenomegaly Arms, hands, wrists without obvious deformity swelling or tenderness, range of motion seems within normal limits Pulses: 2+ radial pulses, 2+ pedal pulses, normal cap refill Ext: no edema   Assessment: Encounter Diagnoses  Name Primary?   Bruised rib, left, initial encounter Yes   Fall, initial encounter      Plan: We discussed her symptoms and concerns.  She seems to have a mild bruise ribs on the lower left chest wall.  Continue Tylenol as needed.  She declines any stronger pain medication and does not seem to be in much pain today.  We discussed incentive spirometry or deep breathing exercises hourly for the next 48 hours if possible  Can use cool compress or cool pack if desired  Reassured  Shandrika was seen today for fell last week.  Diagnoses and all orders for this visit:  Bruised rib, left, initial encounter  Fall, initial encounter  Other orders -     ipratropium (ATROVENT) 0.03 % nasal spray; Place 2 sprays into both nostrils 2 (two) times daily.    Follow up: prn

## 2022-04-23 ENCOUNTER — Ambulatory Visit (INDEPENDENT_AMBULATORY_CARE_PROVIDER_SITE_OTHER): Payer: Medicare Other | Admitting: Bariatrics

## 2022-04-23 ENCOUNTER — Encounter (INDEPENDENT_AMBULATORY_CARE_PROVIDER_SITE_OTHER): Payer: Self-pay

## 2022-04-24 ENCOUNTER — Encounter (INDEPENDENT_AMBULATORY_CARE_PROVIDER_SITE_OTHER): Payer: Self-pay | Admitting: Bariatrics

## 2022-04-24 ENCOUNTER — Ambulatory Visit (INDEPENDENT_AMBULATORY_CARE_PROVIDER_SITE_OTHER): Payer: Medicare Other | Admitting: Bariatrics

## 2022-04-24 VITALS — BP 113/76 | HR 101 | Temp 97.7°F | Ht 63.0 in | Wt 206.0 lb

## 2022-04-24 DIAGNOSIS — R632 Polyphagia: Secondary | ICD-10-CM | POA: Diagnosis not present

## 2022-04-24 DIAGNOSIS — E1169 Type 2 diabetes mellitus with other specified complication: Secondary | ICD-10-CM | POA: Diagnosis not present

## 2022-04-24 DIAGNOSIS — Z7985 Long-term (current) use of injectable non-insulin antidiabetic drugs: Secondary | ICD-10-CM

## 2022-04-24 DIAGNOSIS — E669 Obesity, unspecified: Secondary | ICD-10-CM | POA: Diagnosis not present

## 2022-04-24 DIAGNOSIS — Z6836 Body mass index (BMI) 36.0-36.9, adult: Secondary | ICD-10-CM

## 2022-04-24 MED ORDER — OZEMPIC (0.25 OR 0.5 MG/DOSE) 2 MG/3ML ~~LOC~~ SOPN: PEN_INJECTOR | SUBCUTANEOUS | 0 refills | Status: DC

## 2022-04-28 NOTE — Progress Notes (Unsigned)
Chief Complaint:   OBESITY April Matthews is here to discuss her progress with her obesity treatment plan along with follow-up of her obesity related diagnoses. April Matthews is on the Category 1 Plan and states she is following her eating plan approximately 50% of the time. April Matthews states she is doing 0 minutes 0 times per week.  Today's visit was #: 87 Starting weight: 205 lbs Starting date: 05/09/2020 Today's weight: 206 lbs Today's date: 04/24/2022 Total lbs lost to date: 0 Total lbs lost since last in-office visit: 0  Interim History: Linz[place] is up 1 pound since her last visit.  Subjective:   1. Type 2 diabetes mellitus with obesity ([place]) April Matthews is taking Ozempic as directed, and she denies side effects.  2. Polyphagia April Matthews is taking Ozempic as directed, and she denies side effects.  Assessment/Plan:   1. Type 2 diabetes mellitus with obesity (HCC) We will refill Ozempic 0.5 mg once weekly for 1 month.  - Semaglutide,0.25 or 0.'5MG'$ /DOS, (OZEMPIC, 0.25 OR 0.5 MG/DOSE,) 2 MG/3ML SOPN; 0.5 mg into the skin weekly  Dispense: 3 mL; Refill: 0  2. Polyphagia April Matthews will continue Ozempic as directed.  She will decrease carbohydrates, increase protein, and healthy fats.  3. Obesity, Current BMI 36.5 April Matthews is currently in the action stage of change. As such, her goal is to continue with weight loss efforts. She has agreed to the Category 1 Plan.   Meal planning was discussed.  She is to increase exercise/activity.  Exercise goals: Increase steps to 10,000.  Behavioral modification strategies: increasing lean protein intake, decreasing simple carbohydrates, increasing vegetables, increasing water intake, decreasing eating out, no skipping meals, meal planning and cooking strategies, keeping healthy foods in the home, and planning for success.  April Matthews has agreed to follow-up with our clinic in 4 weeks. She was informed of the importance of frequent follow-up visits to maximize her success  with intensive lifestyle modifications for her multiple health conditions.   Objective:   Blood pressure 113/76, pulse (!) 101, temperature 97.7 F (36.5 C), height '5\' 3"'$  (1.6 m), weight 206 lb (93.4 kg), SpO2 95 %. Body mass index is 36.49 kg/m.  General: Cooperative, alert, well developed, in no acute distress. HEENT: Conjunctivae and lids unremarkable. Cardiovascular: Regular rhythm.  Lungs: Normal work of breathing. Neurologic: No focal deficits.   Lab Results  Component Value Date   CREATININE 1.23 (H) 01/30/2022   BUN 36 (H) 01/30/2022   NA 141 01/30/2022   K 4.6 01/30/2022   CL 105 01/30/2022   CO2 21 01/30/2022   Lab Results  Component Value Date   ALT 17 01/30/2022   AST 16 01/30/2022   ALKPHOS 72 01/30/2022   BILITOT 0.3 01/30/2022   Lab Results  Component Value Date   HGBA1C 6.8 (H) 01/09/2022   HGBA1C 6.2 (A) 10/02/2021   HGBA1C 6.5 (H) 06/10/2021   HGBA1C 6.5 (H) 01/28/2021   HGBA1C 6.8 (A) 06/11/2020   Lab Results  Component Value Date   INSULIN 10.2 01/09/2022   INSULIN 15.3 06/10/2021   INSULIN 14.9 01/28/2021   INSULIN 25.5 (H) 05/09/2020   Lab Results  Component Value Date   TSH 2.990 10/02/2021   Lab Results  Component Value Date   CHOL 135 06/10/2021   HDL 50 06/10/2021   LDLCALC 61 06/10/2021   TRIG 139 06/10/2021   CHOLHDL 2.5 06/02/2019   Lab Results  Component Value Date   VD25OH 59.8 01/09/2022   VD25OH 47.9 06/10/2021   VD25OH  58.5 01/28/2021   Lab Results  Component Value Date   WBC 6.7 10/02/2021   HGB 13.0 10/02/2021   HCT 37.9 10/02/2021   MCV 95 10/02/2021   PLT 331 10/02/2021   No results found for: "IRON", "TIBC", "FERRITIN"  Attestation Statements:   Reviewed by clinician on day of visit: allergies, medications, problem list, medical history, surgical history, family history, social history, and previous encounter notes.   Wilhemena Durie, am acting as Location manager for [employer], DO.  I have  reviewed the above documentation for accuracy and completeness, and I agree with the above. Jearld Lesch, DO

## 2022-05-06 ENCOUNTER — Telehealth: Payer: Self-pay | Admitting: Family Medicine

## 2022-05-06 ENCOUNTER — Other Ambulatory Visit (INDEPENDENT_AMBULATORY_CARE_PROVIDER_SITE_OTHER): Payer: Self-pay | Admitting: Bariatrics

## 2022-05-06 DIAGNOSIS — E1169 Type 2 diabetes mellitus with other specified complication: Secondary | ICD-10-CM

## 2022-05-06 MED ORDER — EPINEPHRINE 0.3 MG/0.3ML IJ SOAJ: 0.3000 mg | INTRAMUSCULAR | 0 refills | Status: AC | PRN

## 2022-05-06 NOTE — Telephone Encounter (Signed)
Okay to RF epi-pen (prev had rx, not in current list), and remind pt that if she uses it, she will need to seek care at the ER.

## 2022-05-06 NOTE — Telephone Encounter (Signed)
Patient advised and rx sent in.

## 2022-05-06 NOTE — Telephone Encounter (Signed)
Pt called in asking to be prescribed an epipen because she is going out of town and allergic to bees and such.

## 2022-05-07 ENCOUNTER — Other Ambulatory Visit (INDEPENDENT_AMBULATORY_CARE_PROVIDER_SITE_OTHER): Payer: Self-pay | Admitting: Bariatrics

## 2022-05-07 DIAGNOSIS — E1159 Type 2 diabetes mellitus with other circulatory complications: Secondary | ICD-10-CM

## 2022-05-13 ENCOUNTER — Other Ambulatory Visit (INDEPENDENT_AMBULATORY_CARE_PROVIDER_SITE_OTHER): Payer: Self-pay | Admitting: Bariatrics

## 2022-05-13 ENCOUNTER — Telehealth (INDEPENDENT_AMBULATORY_CARE_PROVIDER_SITE_OTHER): Payer: Self-pay | Admitting: *Deleted

## 2022-05-13 DIAGNOSIS — E669 Obesity, unspecified: Secondary | ICD-10-CM

## 2022-05-13 NOTE — Telephone Encounter (Signed)
Called patient and informed her the prescription has been called into the pharmacy. Please allow pharmacy 2-3 hours before going to pick it up and call them to make sure. Patient verbalized understanding.

## 2022-05-13 NOTE — Telephone Encounter (Signed)
Patient called  check on status of Ozempic prescription sent to the pharmacy on 04/21/2022.

## 2022-05-13 NOTE — Telephone Encounter (Signed)
Called and spoke with pharmacy, per pharmacy they did receive the prescription but for some reason it was kicked out their system. Verbal prescription given to pharmacy to fill.

## 2022-05-21 ENCOUNTER — Ambulatory Visit (INDEPENDENT_AMBULATORY_CARE_PROVIDER_SITE_OTHER): Payer: Medicare Other | Admitting: Adult Health

## 2022-05-21 ENCOUNTER — Encounter (INDEPENDENT_AMBULATORY_CARE_PROVIDER_SITE_OTHER): Payer: Self-pay | Admitting: Adult Health

## 2022-05-21 ENCOUNTER — Other Ambulatory Visit (INDEPENDENT_AMBULATORY_CARE_PROVIDER_SITE_OTHER): Payer: Self-pay | Admitting: Adult Health

## 2022-05-21 VITALS — BP 136/84 | HR 61 | Temp 97.8°F | Ht 63.0 in | Wt 209.0 lb

## 2022-05-21 DIAGNOSIS — E669 Obesity, unspecified: Secondary | ICD-10-CM

## 2022-05-21 DIAGNOSIS — E1159 Type 2 diabetes mellitus with other circulatory complications: Secondary | ICD-10-CM | POA: Diagnosis not present

## 2022-05-21 DIAGNOSIS — Z7985 Long-term (current) use of injectable non-insulin antidiabetic drugs: Secondary | ICD-10-CM

## 2022-05-21 DIAGNOSIS — I152 Hypertension secondary to endocrine disorders: Secondary | ICD-10-CM

## 2022-05-21 DIAGNOSIS — Z6837 Body mass index (BMI) 37.0-37.9, adult: Secondary | ICD-10-CM | POA: Diagnosis not present

## 2022-05-21 DIAGNOSIS — E559 Vitamin D deficiency, unspecified: Secondary | ICD-10-CM | POA: Diagnosis not present

## 2022-05-21 DIAGNOSIS — E1169 Type 2 diabetes mellitus with other specified complication: Secondary | ICD-10-CM

## 2022-05-21 MED ORDER — SEMAGLUTIDE (1 MG/DOSE) 4 MG/3ML ~~LOC~~ SOPN: 1.0000 mg | PEN_INJECTOR | SUBCUTANEOUS | 0 refills | Status: DC

## 2022-05-21 MED ORDER — LOSARTAN POTASSIUM-HCTZ 100-12.5 MG PO TABS: 1.0000 | ORAL_TABLET | Freq: Every day | ORAL | 0 refills | Status: DC

## 2022-05-22 LAB — VITAMIN D 25 HYDROXY (VIT D DEFICIENCY, FRACTURES): Vit D, 25-Hydroxy: 61 ng/mL (ref 30.0–100.0)

## 2022-05-22 LAB — HEMOGLOBIN A1C
Est. average glucose Bld gHb Est-mCnc: 143 mg/dL
Hgb A1c MFr Bld: 6.6 % — ABNORMAL HIGH (ref 4.8–5.6)

## 2022-05-29 NOTE — Progress Notes (Signed)
Chief Complaint:   OBESITY April Matthews is here to discuss her progress with her obesity treatment plan along with follow-up of her obesity related diagnoses. April Matthews is on the Category 1 Plan and states she is following her eating plan approximately 60% of the time. April Matthews states she is not currently exercising.  Today's visit was #: 51 Starting weight: 205 lbs Starting date: 05/09/2020 Today's weight: 209 lbs Today's date: 05/21/22 Total lbs lost to date: 0 Total lbs lost since last in-office visit: +3  Interim History:  She recently started doTerra womens bone nutrient, however has been off the last 2 weeks due to travel. When home- she takes do [place] 2 caps once daily and over-the-counter multivitamin, ergocalciferol every 14 days. do terra directions for use-4 capsules daily.   Subjective:   1. Type 2 diabetes mellitus with other specified complication, without long-term current use of insulin (HCC) Currently on Ozempic 0.5 mg once weekly. She denies mass in neck, dysphagia, dyspepsia, persistent hoarseness, abd pain, or N/V/Constipation.  2. Vitamin D deficiency She is on over-the-counter multivitamin, ergocalciferol every 14 days.  3. Hypertension associated with type 2 diabetes mellitus ([place]) BP/heart rate stable at office visit. She is on losartan/HCTZ 100/12.5 mg-ran out greater than 1 week Has been taking old prescription of losartan 100 mg plain, endorses increased lower extremity edema.  Assessment/Plan:   1. Type 2 diabetes mellitus with other specified complication, without long-term current use of insulin (HCC) Check labs - Hemoglobin A1c Refill and increase dose of Ozempic - Semaglutide, 1 MG/DOSE, 4 MG/3ML SOPN; Inject 1 mg as directed once a week.  Dispense: 3 mL; Refill: 0  2. Vitamin D deficiency Check Labs Increase do terra to-4 capsules daily. Continue OTC multivitamin daily.  3. Hypertension associated with type 2 diabetes mellitus (HCC) Refill -  losartan-hydrochlorothiazide (HYZAAR) 100-12.5 MG tablet; Take 1 tablet by mouth daily.  Dispense: 30 tablet; Refill: 0  4. Obesity, Current BMI 37.0 April Matthews is currently in the action stage of change. As such, her goal is to continue with weight loss efforts. She has agreed to the Category 1 Plan.   Exercise goals: as is  Behavioral modification strategies: increasing lean protein intake, decreasing simple carbohydrates, meal planning and cooking strategies, keeping healthy foods in the home, and planning for success.  April Matthews has agreed to follow-up with our clinic in 3 weeks. She was informed of the importance of frequent follow-up visits to maximize her success with intensive lifestyle modifications for her multiple health conditions.   April Matthews was informed we would discuss her lab results at her next visit unless there is a critical issue that needs to be addressed sooner. April Matthews agreed to keep her next visit at the agreed upon time to discuss these results.  Objective:   Blood pressure 136/84, pulse 61, temperature 97.8 F (36.6 C), height '5\' 3"'$  (1.6 m), weight 209 lb (94.8 kg), SpO2 98 %. Body mass index is 37.02 kg/m.  General: Cooperative, alert, well developed, in no acute distress. HEENT: Conjunctivae and lids unremarkable. Cardiovascular: Regular rhythm.  Lungs: Normal work of breathing. Neurologic: No focal deficits.   Lab Results  Component Value Date   CREATININE 1.23 (H) 01/30/2022   BUN 36 (H) 01/30/2022   NA 141 01/30/2022   K 4.6 01/30/2022   CL 105 01/30/2022   CO2 21 01/30/2022   Lab Results  Component Value Date   ALT 17 01/30/2022   AST 16 01/30/2022   ALKPHOS 72 01/30/2022  BILITOT 0.3 01/30/2022   Lab Results  Component Value Date   HGBA1C 6.6 (H) 05/21/2022   HGBA1C 6.8 (H) 01/09/2022   HGBA1C 6.2 (A) 10/02/2021   HGBA1C 6.5 (H) 06/10/2021   HGBA1C 6.5 (H) 01/28/2021   Lab Results  Component Value Date   INSULIN 10.2 01/09/2022   INSULIN  15.3 06/10/2021   INSULIN 14.9 01/28/2021   INSULIN 25.5 (H) 05/09/2020   Lab Results  Component Value Date   TSH 2.990 10/02/2021   Lab Results  Component Value Date   CHOL 135 06/10/2021   HDL 50 06/10/2021   LDLCALC 61 06/10/2021   TRIG 139 06/10/2021   CHOLHDL 2.5 06/02/2019   Lab Results  Component Value Date   VD25OH 61.0 05/21/2022   VD25OH 59.8 01/09/2022   VD25OH 47.9 06/10/2021   Lab Results  Component Value Date   WBC 6.7 10/02/2021   HGB 13.0 10/02/2021   HCT 37.9 10/02/2021   MCV 95 10/02/2021   PLT 331 10/02/2021   No results found for: "IRON", "TIBC", "FERRITIN"  Obesity Behavioral Intervention:   Approximately 15 minutes were spent on the discussion below.  ASK: We discussed the diagnosis of obesity with April Matthews today and April Matthews agreed to give [place] permission to discuss obesity behavioral modification therapy today.  ASSESS: April Matthews has the diagnosis of obesity and her BMI today is 37. April Matthews is in the action stage of change.   ADVISE: April Matthews was educated on the multiple health risks of obesity as well as the benefit of weight loss to improve her health. She was advised of the need for long term treatment and the importance of lifestyle modifications to improve her current health and to decrease her risk of future health problems.  AGREE: Multiple dietary modification options and treatment options were discussed and April Matthews agreed to follow the recommendations documented in the above note.  ARRANGE: April Matthews was educated on the importance of frequent visits to treat obesity as outlined per CMS and USPSTF guidelines and agreed to schedule her next follow up appointment today.  Attestation Statements:   Reviewed by clinician on day of visit: allergies, medications, problem list, medical history, surgical history, family history, social history, and previous encounter notes.  I, Georgianne Fick, FNP, am acting as Location manager for Mina Marble, NP.  I  have reviewed the above documentation for accuracy and completeness, and I agree with the above. -  Draiden Mirsky d. Neiman Roots, NP-C

## 2022-06-03 DIAGNOSIS — L4050 Arthropathic psoriasis, unspecified: Secondary | ICD-10-CM | POA: Diagnosis not present

## 2022-06-03 DIAGNOSIS — Z79899 Other long term (current) drug therapy: Secondary | ICD-10-CM | POA: Diagnosis not present

## 2022-06-04 ENCOUNTER — Encounter (INDEPENDENT_AMBULATORY_CARE_PROVIDER_SITE_OTHER): Payer: Self-pay

## 2022-06-09 ENCOUNTER — Encounter (INDEPENDENT_AMBULATORY_CARE_PROVIDER_SITE_OTHER): Payer: Self-pay | Admitting: Adult Health

## 2022-06-09 ENCOUNTER — Ambulatory Visit (INDEPENDENT_AMBULATORY_CARE_PROVIDER_SITE_OTHER): Payer: Medicare Other | Admitting: Adult Health

## 2022-06-09 VITALS — BP 138/87 | HR 78 | Temp 98.3°F | Ht 63.0 in | Wt 207.0 lb

## 2022-06-09 DIAGNOSIS — E669 Obesity, unspecified: Secondary | ICD-10-CM | POA: Diagnosis not present

## 2022-06-09 DIAGNOSIS — Z7984 Long term (current) use of oral hypoglycemic drugs: Secondary | ICD-10-CM

## 2022-06-09 DIAGNOSIS — E1169 Type 2 diabetes mellitus with other specified complication: Secondary | ICD-10-CM

## 2022-06-09 DIAGNOSIS — Z6836 Body mass index (BMI) 36.0-36.9, adult: Secondary | ICD-10-CM | POA: Diagnosis not present

## 2022-06-09 DIAGNOSIS — Z7985 Long-term (current) use of injectable non-insulin antidiabetic drugs: Secondary | ICD-10-CM

## 2022-06-09 DIAGNOSIS — E559 Vitamin D deficiency, unspecified: Secondary | ICD-10-CM | POA: Diagnosis not present

## 2022-06-09 DIAGNOSIS — E1159 Type 2 diabetes mellitus with other circulatory complications: Secondary | ICD-10-CM | POA: Diagnosis not present

## 2022-06-09 DIAGNOSIS — I152 Hypertension secondary to endocrine disorders: Secondary | ICD-10-CM | POA: Diagnosis not present

## 2022-06-09 DIAGNOSIS — N1832 Chronic kidney disease, stage 3b: Secondary | ICD-10-CM

## 2022-06-09 MED ORDER — SEMAGLUTIDE (1 MG/DOSE) 4 MG/3ML ~~LOC~~ SOPN: 1.0000 mg | PEN_INJECTOR | SUBCUTANEOUS | 0 refills | Status: DC

## 2022-06-09 MED ORDER — LOSARTAN POTASSIUM-HCTZ 100-12.5 MG PO TABS
1.0000 | ORAL_TABLET | Freq: Every day | ORAL | 0 refills | Status: DC
Start: 2022-06-09 — End: 2022-07-07

## 2022-06-16 NOTE — Progress Notes (Unsigned)
Chief Complaint:   OBESITY April Matthews is here to discuss her progress with her obesity treatment plan along with follow-up of her obesity related diagnoses. April Matthews is on the Category 1 Plan and states she is following her eating plan approximately 60% of the time. April Matthews states she is walking around school.  Today's visit was #: 71 Starting weight: 205 lbs Starting date: 05/09/2020 Today's weight: 207 lbs Today's date: 06/09/2022 Total lbs lost to date: 0 lbs Total lbs lost since last in-office visit: 2  Interim History: On 05/21/22 I reviewed labs with April Matthews at length. 28 lb weight lost with Intermitted fasting, 2018-2020?  Subjective:   1. Hypertension associated with type 2 diabetes mellitus ([place]) Labs discussed during visit today. 05/21/22, CMP-electrolytes within normal limits--kidney function slightly worsened. She is currently taking Metformin 500 mg 2 tabs daily per PCP. Ozempic 1 mg ([facility]).  2. Type 2 diabetes mellitus with other specified complication, without long-term current use of insulin ([place]) Labs discussed during visit today. 05/21/22, Ozempic increased from 0.5 mg to 1 mg. April Matthews will take 1st increased dose tomorrow--06/10/22. Her A1c was 6.6, on 05/21/22.  3. Stage 3b chronic kidney disease ([place]) Labs discussed during visit today. 05/21/22 CMP--worsened serum creatine and GFR. April Matthews is taking Metformin 500 mg, 2 tabs at night, PCP manages and Losartan/HCTZ 100/12.5 mg daily.  4. Vitamin D deficiency Labs discussed during visit today. April Matthews is on Ergocalciferol every 14 days. On 05/21/22, Vit D level stable. She is also on over the counter Multivitamin.   Assessment/Plan:   1. Hypertension associated with type 2 diabetes mellitus (HCC) We will refill Hyzaar 100-12.5 mg daily for 3 months with 0 refills.  -Refill losartan-hydrochlorothiazide (HYZAAR) 100-12.5 MG tablet; Take 1 tablet by mouth daily.  Dispense: 90 tablet; Refill: 0  2. Type 2 diabetes mellitus with  other specified complication, without long-term current use of insulin (HCC) We will refill Ozempic 1 mg once a week for 1 month with 0 refills.  -Refill Semaglutide, 1 MG/DOSE, 4 MG/3ML SOPN; Inject 1 mg as directed once a week.  Dispense: 3 mL; Refill: 0  3. Stage 3b chronic kidney disease ([place]) April Matthews will avoid Nephrotoxic substances. She will follow up with PCP.  4. Vitamin D deficiency We will refill Ergocalciferol 50,000 IU every 14 days for 1 month with 0 refills.  5. Obesity, Current BMI 36.8 April Matthews is currently in the action stage of change. As such, her goal is to continue with weight loss efforts. She has agreed to the Category 1 Plan.   Exercise goals: As is.  Behavioral modification strategies: increasing lean protein intake, decreasing simple carbohydrates, meal planning and cooking strategies, keeping healthy foods in the home, and planning for success.  April Matthews has agreed to follow-up with our clinic in 4 weeks. She was informed of the importance of frequent follow-up visits to maximize her success with intensive lifestyle modifications for her multiple health conditions.   Objective:   Blood pressure 138/87, pulse 78, temperature 98.3 F (36.8 C), height '5\' 3"'$  (1.6 m), weight 207 lb (93.9 kg), SpO2 96 %. Body mass index is 36.67 kg/m.  General: Cooperative, alert, well developed, in no acute distress. HEENT: Conjunctivae and lids unremarkable. Cardiovascular: Regular rhythm.  Lungs: Normal work of breathing. Neurologic: No focal deficits.   Lab Results  Component Value Date   CREATININE 1.23 (H) 01/30/2022   BUN 36 (H) 01/30/2022   NA 141 01/30/2022   K 4.6 01/30/2022   CL 105  01/30/2022   CO2 21 01/30/2022   Lab Results  Component Value Date   ALT 17 01/30/2022   AST 16 01/30/2022   ALKPHOS 72 01/30/2022   BILITOT 0.3 01/30/2022   Lab Results  Component Value Date   HGBA1C 6.6 (H) 05/21/2022   HGBA1C 6.8 (H) 01/09/2022   HGBA1C 6.2 (A) 10/02/2021    HGBA1C 6.5 (H) 06/10/2021   HGBA1C 6.5 (H) 01/28/2021   Lab Results  Component Value Date   INSULIN 10.2 01/09/2022   INSULIN 15.3 06/10/2021   INSULIN 14.9 01/28/2021   INSULIN 25.5 (H) 05/09/2020   Lab Results  Component Value Date   TSH 2.990 10/02/2021   Lab Results  Component Value Date   CHOL 135 06/10/2021   HDL 50 06/10/2021   LDLCALC 61 06/10/2021   TRIG 139 06/10/2021   CHOLHDL 2.5 06/02/2019   Lab Results  Component Value Date   VD25OH 61.0 05/21/2022   VD25OH 59.8 01/09/2022   VD25OH 47.9 06/10/2021   Lab Results  Component Value Date   WBC 6.7 10/02/2021   HGB 13.0 10/02/2021   HCT 37.9 10/02/2021   MCV 95 10/02/2021   PLT 331 10/02/2021   No results found for: "IRON", "TIBC", "FERRITIN"  Obesity Behavioral Intervention:   Approximately 15 minutes were spent on the discussion below.  ASK: We discussed the diagnosis of obesity with [name] today and [name] agreed to give [place] permission to discuss obesity behavioral modification therapy today.  ASSESS: [name] has the diagnosis of obesity and her BMI today is 36.8. [name] is in the action stage of change.   ADVISE: [name] was educated on the multiple health risks of obesity as well as the benefit of weight loss to improve her health. She was advised of the need for long term treatment and the importance of lifestyle modifications to improve her current health and to decrease her risk of future health problems.  AGREE: Multiple dietary modification options and treatment options were discussed and [name] agreed to follow the recommendations documented in the above note.  ARRANGE: [name] was educated on the importance of frequent visits to treat obesity as outlined per CMS and USPSTF guidelines and agreed to schedule her next follow up appointment today.  Attestation Statements:   Reviewed by clinician on day of visit: allergies, medications, problem list, medical history, surgical history, family  history, social history, and previous encounter notes.  I, Daziya Redmond, RMA, am acting as transcriptionist for Mina Marble, NP.  I have reviewed the above documentation for accuracy and completeness, and I agree with the above. -  ***

## 2022-06-18 NOTE — Progress Notes (Signed)
Chief Complaint  Patient presents with   Annual Exam    Fasting AWV- Wants to discuss Ozempic side effects. Sometimes her hands shake and she is not sure if its from low blood sugar. She has not had Shingrix or Tdap. She will schedule eye exam soon    April Matthews is a 76 y.o. female who presents for annual wellness visit and follow-up on chronic medical conditions.    Slight tremulousness in the morning, prior to eating.  This isn't every day.  Ongoing since last fall, intermittently. 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  She no longer drinks caffeine, but still gets a little tremulous.  This seems to only occur prior to eating, definitely when hungry, since being on the Ozempic.  She was seen in December with palpitations, edema, weight gain, and very high blood pressure.  She had been eating more canned foods/soups at that time. She had been off Ozempic at that time due to it not being available.  She was advised to cut back on caffeine and let us know if palpitations persisted. No further issues with palpitations.  Edema improved once [facility] changed her BP med to include diuretic.  Obesity: She continues to be seen by [facility]. No significant weight loss in the 2 years she has been with them (lost and regained, up and down). They prescribe her ozempic, vitamin D, and have adjusted her BP meds.  Diabetes:  She doesn't check blood sugars (hasn't wanted to).  She is compliant with taking metformin with dinner ('1000mg'$  daily).  The dose was decreased from '1500mg'$  when Katy at [facility] added the diuretic. Ozempic was started by [facility] in June 2022. She is currently taking '1mg'$  weekly, had first dose last week.  She denies nausea/vomiting ,but "stomach didn't feel good", a little achey for the first few days at the higher dose.  She has been fine the last 3 days.  Denies any numbness or tingling.  Last  diabetic eye exam was 01/2021 (her appt was cancelled by ophtho this summer, needs to r/s) She checks feet regularly, no concerns. Denies polydipsia, polyuria.  Poss hypoglycemia--tremulous when hungry. No sweats. Lab Results  Component Value Date   HGBA1C 6.6 (H) 05/21/2022   Hypertension: She is compliant with taking losartan HCT 100/12.5 (managed/adjusted by [facility] clinic) and toprol XL '25mg'$  daily. BP's at home are running 120-130/70-80's. (monitor verified as accurate in 12/2016) She denies any headaches, dizziness, swelling, chest pain. BP Readings from Last 3 Encounters:  06/19/22 120/70  06/09/22 138/87  05/21/22 136/84   Noted to have higher creatinine on labs from Dr. Amil Amen 06/03/22, with Cr 1.56. She was also noted to have Na of 150, and BUN also elevated (29). See scanned labs. She admits to being "bad about" drinking enough water.   Lab Results  Component Value Date   CREATININE 1.23 (H) 01/30/2022   Hyperlipidemia:  Tolerating atorvastatin without side effects.  Trying to follow lowfat, low cholesterol diet.  At goal last year, due for recheck. Lab Results  Component Value Date   CHOL 135 06/10/2021   HDL 50 06/10/2021   LDLCALC 61 06/10/2021   TRIG 139 06/10/2021   CHOLHDL 2.5 06/02/2019   She has had some borderline thyroid tests in the past, with TSH >4.  Recheck in December was normal.  She denies changes in hair/skin/bowels (just sporadic loose stool) or energy. +difficulty losing weight. Lab Results  Component Value Date   TSH 2.990 10/02/2021   Psoriatic arthritis:  Under the care of Dr. Amil Amen, last seen in August. Only labs received from August, last notes were from 09/2021. Stelara was switched to Simponi Aria infusion every 2 months in 11/2020 (due to cost of Stelara). Medication is working well--psoriasis is improving, and she denies any joint pains. (Just small amount on the elbows, nowhere else). She sees Dr. Martinique regularly.  Vitamin D deficiency:  H/o  mild deficiency in the past (level of 27 in the past); most recent level 61 in 04/2022.  She continues to take prescription ergocalciferol every other weekly, managed by [facility].   Immunization History  Administered Date(s) Administered   Fluad Quad(high Dose 65+) 10/02/2021   Influenza Split 09/08/2012, 08/10/2014, 08/21/2015   Influenza, High Dose Seasonal PF 07/10/2016, 07/13/2017, 07/20/2018, 07/11/2020   PFIZER(Purple Top)SARS-COV-2 Vaccination 11/17/2019, 12/13/2019, 10/16/2020, 04/26/2021   PPD Test 09/08/2012   Pneumococcal Conjugate-13 04/20/2014   Pneumococcal Polysaccharide-23 04/23/2015   Tdap 06/19/2011   Last Pap smear: 05/2019 normal, no high risk HPV detected Last mammogram: 10/2017  Last colonoscopy: 07/2014 mild diverticulosis; Dr. Olevia Perches; repeat 10 years Last DEXA: 10/2017 T-1.7 L femoral neck. Repeat ordered 05/2021, never scheduled. Dentist: goes twice yearly Ophtho: goes yearly; no longer sees retinal specialist Exercise:  "Zero" In January she started using her exercise bike an hour/d--BP was high at that time, was told not to ride if her BP was high. Never got back on the bike after BP's improved. She only swam once this summer. (Prior routine had been  30 minutes in her home (gets 6-8K steps daily) and swimming an hour daily (swimming and aerobic exercise in the pool), about 5x/week.) Has weights, used to carry them when walking in the house (3#)   Patient Care Team: Rita Ohara, MD as PCP - General (Family Medicine) Dr. Amil Amen (rheum) Dr. Amy Martinique (derm) Dr. Kathrin Penner (ophtho)--retired, hasn't seen new doc yet Dr. Oval Linsey (retinal specialist/oncologist at [facility] ophtho)  Dr. Luther Parody (dentist)--no longer seeing, switching. Dr. Olevia Perches (retired--GI) Dr. Owens Shark, Mina Marble, NP ([facility])   Depression Screening: [place] Office Visit from 06/19/2022 in [facility]  PHQ-2 Total Score 0       Falls screen:     06/19/2022    2:58 PM 06/19/2021     3:08 PM 06/11/2020    9:57 AM 06/02/2019    3:01 PM 02/25/2018    8:52 AM  Fall Risk   Falls in the past year? 1 0 0 0 No  Number falls in past yr: 0 0     Injury with Fall? 0 0     Risk for fall due to : No Fall Risks No Fall Risks     Follow up Falls evaluation completed Falls evaluation completed        Functional Status Survey: Is the patient deaf or have difficulty hearing?: No Does the patient have difficulty seeing, even when wearing glasses/contacts?: No Does the patient have difficulty concentrating, remembering, or making decisions?: No Does the patient have difficulty walking or climbing stairs?: Yes (climbing the stairs due to left knee pain) Does the patient have difficulty dressing or bathing?: No Does the patient have difficulty doing errands alone such as visiting a doctor's office or shopping?: No  Mini-Cog Scoring: 5     End of Life Discussion:  Patient has a medical power of attorney (her daughter) and Living Will. We do not have copies.   PMH, PSH,  SH and FH were reviewed and updated  Outpatient Encounter Medications as of 06/19/2022  Medication Sig Note   atorvastatin (LIPITOR) 20 MG tablet TAKE 1 TABLET(20 MG) BY MOUTH DAILY    ipratropium (ATROVENT) 0.03 % nasal spray Place 2 sprays into both nostrils 2 (two) times daily.    losartan-hydrochlorothiazide (HYZAAR) 100-12.5 MG tablet Take 1 tablet by mouth daily.    metFORMIN (GLUCOPHAGE-XR) 500 MG 24 hr tablet TAKE 3 TABLETS BY MOUTH WITH BREAKFAST (Patient taking differently: TAKE 2 TABLETS BY MOUTH AT NIGHT)    metoprolol succinate (TOPROL-XL) 25 MG 24 hr tablet Take 1 tablet (25 mg total) by mouth daily. 06/19/2022: Needs refill   Multiple Vitamin (MULTIVITAMIN) tablet Take 1 tablet by mouth daily.    Semaglutide, 1 MG/DOSE, 4 MG/3ML SOPN Inject 1 mg as directed once a week.    Vitamin D, Ergocalciferol, (DRISDOL) 1.25 MG (50000 UNIT) CAPS capsule Take 1 capsule (50,000 Units total) by mouth every 14  (fourteen) days. 06/19/2022: Last dose was 8/15   calcium carbonate (TUMS - DOSED IN MG ELEMENTAL CALCIUM) 500 MG chewable tablet Chew 1 tablet by mouth daily. (Patient not taking: Reported on 06/19/2022) 06/19/2022: Uses prn, needing a little more frequently with ozempic dose changes   EPINEPHrine 0.3 mg/0.3 mL IJ SOAJ injection Inject 0.3 mg into the muscle as needed for anaphylaxis. (Patient not taking: Reported on 06/19/2022)    vitamin B-12 (CYANOCOBALAMIN) 1000 MCG tablet Take 1,000 mcg by mouth daily. (Patient not taking: Reported on 06/19/2022)    [DISCONTINUED] fluticasone (FLONASE) 50 MCG/ACT nasal spray Place 1 spray into both nostrils daily.    No facility-administered encounter medications on file as of 06/19/2022.   Allergies  Allergen Reactions   Sesame Oil Anaphylaxis    Sesame seed/oil   Ace Inhibitors Cough   Bee Venom    Latex Rash   ROS:  The patient denies anorexia, fever, headaches,  vision changes, hearing changes, ear pain, sore throat, breast concerns, chest pain, palpitations, dizziness, syncope, dyspnea on exertion, cough, swelling, nausea, vomiting, diarrhea, constipation, abdominal pain, melena, hematochezia, hematuria, incontinence, dysuria, vaginal bleeding, discharge, odor or itch, genital lesions, numbness, tingling, weakness, tremor, suspicious skin lesions, depression, anxiety, abnormal bleeding/bruising, or enlarged lymph nodes. Denies any joint pains currently. Psoriasis--significantly improved on current medication. More frequent heartburn since on Ozempic. Short-lived GI symptoms after ozempic injection, none now. +snoring per husband.  Wakes up feeling refreshed, no daytime somnolence. Tremulous/shaky occasionally, per HPI   PHYSICAL EXAM:  BP 120/70   Pulse 72   Ht '5\' 3"'$  (1.6 m)   Wt 210 lb 9.6 oz (95.5 kg)   SpO2 95%   BMI 37.31 kg/m   Wt Readings from Last 3 Encounters:  06/19/22 210 lb 9.6 oz (95.5 kg)  06/09/22 207 lb (93.9 kg)  05/21/22 209  lb (94.8 kg)    General Appearance:     Alert, cooperative, no distress, appears stated age   Head:     Normocephalic, without obvious abnormality, atraumatic   Eyes:     PERRL, conjunctiva/corneas clear, EOM's intact, fundi benign   Ears:     Normal TM's and external ear canals   Nose:    Normal, no drainage or sinus tenderness  Throat:    Normal mucosa, no lesions  Neck:    Supple, no lymphadenopathy;  thyroid:  no enlargement/ tenderness/nodules; no JVD.  No bruit appreciable   Back:     Spine nontender, no curvature, ROM normal, no CVA tenderness  Lungs:      Clear to auscultation bilaterally without wheezes, rales or ronchi; respirations unlabored   Chest Wall:     No deformity or tenderness   Heart:     Regular rate and rhythm, S1 and S2 normal, no rub or gallop, 2/6 SEM   Breast Exam:     No tenderness, masses, or nipple discharge (slightly inverted on the left, unchanged per pt).  No axillary lymphadenopathy   Abdomen:      Soft, non-tender, nondistended, normoactive bowel sounds,     no masses, no hepatosplenomegaly. Obese. WHSS.   Genitalia:     Normal external genitalia without lesions, +atrophic changes. BUS and vagina normal; no cervical motion tenderness. No abnormal vaginal discharge.  Uterus and adnexa not enlarged, but exam is somewhat limited by her body habitus; adnexa nontender, no masses.  Pap not performed.    Rectal:     Normal tone, no masses or tenderness; heme negative stool  Extremities:    No clubbing, cyanosis or edema   Pulses:    2+ and symmetric all extremities   Skin:    Skin turgor normal, no suspicious lesions.  Lymph nodes:    Cervical, supraclavicular, inguinal and axillary nodes normal   Neurologic:    Normal strength, sensation and gait; reflexes 2+ and symmetric throughout                      Psych:   Normal mood, affect, hygiene and grooming.          Normal diabetic foot exam.   ASSESSMENT/PLAN:  Medicare annual wellness visit,  subsequent  Type 2 diabetes mellitus with other specified complication, without long-term current use of insulin ([place]) - controlled on Ozempic (recently increased to '1mg'$ ) and '1000mg'$  metformin. If Cr remains elevated, consider lowering metformin - Plan: metFORMIN (GLUCOPHAGE-XR) 500 MG 24 hr tablet, Comprehensive metabolic panel, Microalbumin / creatinine urine ratio, POCT Urinalysis DIP (Proadvantage Device)  Hypertension associated with type 2 diabetes mellitus ([place]) - well controlled.  May have some prerenal issues since on diuretic, inadequate water intake - Plan: Comprehensive metabolic panel  Stage 3b chronic kidney disease ([place]) - Recheck today. Encouraged increased water intake. Avoid NSAIDs - Plan: POCT Urinalysis DIP (Proadvantage Device)  Hyperlipidemia associated with type 2 diabetes mellitus (HCC) - cont atorvastatin - Plan: Lipid panel  Osteopenia, unspecified location - discussed Ca, D and weight-bearing exercise  Vitamin D deficiency - monitored/treated by [facility], normal  Benign hypertension - controlled - Plan: metoprolol succinate (TOPROL-XL) 25 MG 24 hr tablet  Class 2 severe obesity due to excess calories with serious comorbidity and body mass index (BMI) of 37.0 to 37.9 in adult ([place]) - comorbidities--DM, HTN, HLD, arthritis. Cont with [facility]. Discussed portions, importance of maintaining proper diet with travel/vacation. Restart exercise  RF lipitor after labs back.  Given sample of Freestyle Libre monitor (2 weeks). Suspect she is having some mild hypoglycemia. Avoid skipping meals, eat small amounts frequently. F/u with [facility] regularly--can show them glu results (they rx her Ozempic).   Discussed monthly self breast exams and yearly mammograms (past due).  Recommended at least 30 minutes of aerobic activity at least 5 days/week, weight-bearing exercise 2x/week; proper sunscreen use reviewed; healthy diet, including goals of calcium and vitamin D intake and alcohol  recommendations (less than or equal to 1 drink/day) reviewed; regular seatbelt use; changing batteries in smoke detectors.  Immunization recommendations discussed-- continue yearly high dose flu shots.  Shingrix recommended, risks/side  effects reviewed, to get from pharmacy.  Risks/SE reviewed TdaP due from pharmacy.  Newly formulated COVID booster recommended when available. RSV vaccine from the pharmacy when available. Colonoscopy is UTD.  Repeat DEXA recommended, ordered last year, never scheduled. [facility]ouraged [facility] and schedule mammo and DEXA   MOST form reviewed/signed. Full Code, Full Care Asked to get [place] copies of her living will and healthcare power of attorney.   Medicare Attestation I have personally reviewed: The patient's medical and social history Their use of alcohol, tobacco or illicit drugs Their current medications and supplements The patient's functional ability including ADLs,fall risks, home safety risks, cognitive, and hearing and visual impairment Diet and physical activities Evidence for depression or mood disorders  The patient's weight, height, BMI have been recorded in the chart.  I have made referrals, counseling, and provided education to the patient based on review of the above and I have provided the patient with a written personalized care plan for preventive services.

## 2022-06-18 NOTE — Patient Instructions (Incomplete)
HEALTH MAINTENANCE RECOMMENDATIONS:  It is recommended that you get at least 30 minutes of aerobic exercise at least 5 days/week (for weight loss, you may need as much as 60-90 minutes). This can be any activity that gets your heart rate up. This can be divided in 10-15 minute intervals if needed, but try and build up your endurance at least once a week.  Weight bearing exercise is also recommended twice weekly.  Eat a healthy diet with lots of vegetables, fruits and fiber.  "Colorful" foods have a lot of vitamins (ie green vegetables, tomatoes, red peppers, etc).  Limit sweet tea, regular sodas and alcoholic beverages, all of which has a lot of calories and sugar.  Up to 1 alcoholic drink daily may be beneficial for women (unless trying to lose weight, watch sugars).  Drink a lot of water.  Calcium recommendations are 1200-1500 mg daily (1500 mg for postmenopausal women or women without ovaries), and vitamin D 1000 IU daily.  This should be obtained from diet and/or supplements (vitamins), and calcium should not be taken all at once, but in divided doses.  Monthly self breast exams and yearly mammograms for women over the age of 56 is recommended.  Sunscreen of at least SPF 30 should be used on all sun-exposed parts of the skin when outside between the hours of 10 am and 4 pm (not just when at beach or pool, but even with exercise, golf, tennis, and yard work!)  Use a sunscreen that says "broad spectrum" so it covers both UVA and UVB rays, and make sure to reapply every 1-2 hours.  Remember to change the batteries in your smoke detectors when changing your clock times in the spring and fall. Carbon monoxide detectors are recommended for your home.  Use your seat belt every time you are in a car, and please drive safely and not be distracted with cell phones and texting while driving.   April Matthews , Thank you for taking time to come for your Medicare Wellness Visit. I appreciate your ongoing  commitment to your health goals. Please review the following plan we discussed and let me know if I can assist you in the future.   This is a list of the screening recommended for you and due dates:  Health Maintenance  Topic Date Due   Zoster (Shingles) Vaccine (1 of 2) Never done   Tetanus Vaccine  06/18/2021   COVID-19 Vaccine (5 - Pfizer risk series) 06/21/2021   Eye exam for diabetics  02/13/2022   Flu Shot  05/27/2022   Complete foot exam   06/19/2022   Hemoglobin A1C  11/21/2022   Pneumonia Vaccine  Completed   DEXA scan (bone density measurement)  Completed   Hepatitis C Screening: USPSTF Recommendation to screen - Ages 20-79 yo.  Completed   HPV Vaccine  Aged Out   Colon Cancer Screening  Discontinued   You are past due for mammogram.  Yearly mammograms are recommended. [facility]. We had also discussed repeating your bone density last year (looking for worsening of osteopenia, to see if treatment is needed to prevent fractures).  This was ordered last year, never scheduled. Please call the [facility] to schedule the mammogram and bone density tests.  Be sure to get adequate calcium and weight-bearing exercise.  Recent vitamin D level was normal.   You are past due for a tetanus booster (TdaP). You need to get this from the pharmacy, as this  is covered by Medicare Part D.   Continue yearly high dose flu shots (not yet available in our office, should have by early September but you may also get it elsewhere, ie at school or pharmacy).    I recommend getting the new shingles vaccine (Shingrix). Since you have Medicare, you will need to get this from the pharmacy, as it is covered by Part D. This is a series of 2 injections, spaced 2 months apart.  I encourage you to get the updated bivalent COVID vaccine when it becomes available (next month?) Pay attention to the news.  The new RSV vaccine is also recommended.  You will need to get this  from the pharmacy when it is available (should be soon).   You either can get vaccines on the same day, or you need to separate them by at least 2 weeks.   Please bring [place] copies of your Living Will and [place] once completed and notarized so that it can be scanned into your medical chart.  Please schedule your diabetic eye exam.

## 2022-06-19 ENCOUNTER — Encounter: Payer: Self-pay | Admitting: Family Medicine

## 2022-06-19 ENCOUNTER — Ambulatory Visit (INDEPENDENT_AMBULATORY_CARE_PROVIDER_SITE_OTHER): Payer: Medicare Other | Admitting: Family Medicine

## 2022-06-19 VITALS — BP 120/70 | HR 72 | Ht 63.0 in | Wt 210.6 lb

## 2022-06-19 DIAGNOSIS — E7800 Pure hypercholesterolemia, unspecified: Secondary | ICD-10-CM

## 2022-06-19 DIAGNOSIS — M8580 Other specified disorders of bone density and structure, unspecified site: Secondary | ICD-10-CM | POA: Diagnosis not present

## 2022-06-19 DIAGNOSIS — I152 Hypertension secondary to endocrine disorders: Secondary | ICD-10-CM

## 2022-06-19 DIAGNOSIS — E1169 Type 2 diabetes mellitus with other specified complication: Secondary | ICD-10-CM

## 2022-06-19 DIAGNOSIS — E559 Vitamin D deficiency, unspecified: Secondary | ICD-10-CM | POA: Diagnosis not present

## 2022-06-19 DIAGNOSIS — Z6837 Body mass index (BMI) 37.0-37.9, adult: Secondary | ICD-10-CM | POA: Diagnosis not present

## 2022-06-19 DIAGNOSIS — E785 Hyperlipidemia, unspecified: Secondary | ICD-10-CM

## 2022-06-19 DIAGNOSIS — I10 Essential (primary) hypertension: Secondary | ICD-10-CM

## 2022-06-19 DIAGNOSIS — N1832 Chronic kidney disease, stage 3b: Secondary | ICD-10-CM

## 2022-06-19 DIAGNOSIS — Z0000 Encounter for general adult medical examination without abnormal findings: Secondary | ICD-10-CM

## 2022-06-19 DIAGNOSIS — E1159 Type 2 diabetes mellitus with other circulatory complications: Secondary | ICD-10-CM

## 2022-06-19 LAB — POCT URINALYSIS DIP (PROADVANTAGE DEVICE)
Bilirubin, UA: NEGATIVE
Blood, UA: NEGATIVE
Glucose, UA: NEGATIVE mg/dL
Ketones, POC UA: NEGATIVE mg/dL
Leukocytes, UA: NEGATIVE
Nitrite, UA: NEGATIVE
Protein Ur, POC: NEGATIVE mg/dL
Specific Gravity, Urine: 1.02
Urobilinogen, Ur: 0.2
pH, UA: 6 (ref 5.0–8.0)

## 2022-06-19 MED ORDER — METOPROLOL SUCCINATE ER 25 MG PO TB24: 25.0000 mg | ORAL_TABLET | Freq: Every day | ORAL | 3 refills | Status: DC

## 2022-06-19 MED ORDER — METFORMIN HCL ER 500 MG PO TB24: ORAL_TABLET | ORAL | 0 refills | Status: DC

## 2022-06-20 ENCOUNTER — Other Ambulatory Visit: Payer: Self-pay

## 2022-06-20 DIAGNOSIS — N1832 Chronic kidney disease, stage 3b: Secondary | ICD-10-CM

## 2022-06-20 LAB — LIPID PANEL
Chol/HDL Ratio: 2.4 ratio (ref 0.0–4.4)
Cholesterol, Total: 132 mg/dL (ref 100–199)
HDL: 54 mg/dL (ref >39)
LDL Chol Calc (NIH): 53 mg/dL (ref 0–99)
Triglycerides: 146 mg/dL (ref 0–149)
VLDL Cholesterol Cal: 25 mg/dL (ref 5–40)

## 2022-06-20 LAB — COMPREHENSIVE METABOLIC PANEL
ALT: 19 IU/L (ref 0–32)
AST: 18 IU/L (ref 0–40)
Albumin/Globulin Ratio: 1.7 (ref 1.2–2.2)
Albumin: 5.1 g/dL — ABNORMAL HIGH (ref 3.8–4.8)
Alkaline Phosphatase: 69 IU/L (ref 44–121)
BUN/Creatinine Ratio: 25 (ref 12–28)
BUN: 42 mg/dL — ABNORMAL HIGH (ref 8–27)
Bilirubin Total: 0.4 mg/dL (ref 0.0–1.2)
CO2: 21 mmol/L (ref 20–29)
Calcium: 10.3 mg/dL (ref 8.7–10.3)
Chloride: 101 mmol/L (ref 96–106)
Creatinine, Ser: 1.66 mg/dL — ABNORMAL HIGH (ref 0.57–1.00)
Globulin, Total: 3 g/dL (ref 1.5–4.5)
Glucose: 112 mg/dL — ABNORMAL HIGH (ref 70–99)
Potassium: 4.4 mmol/L (ref 3.5–5.2)
Sodium: 143 mmol/L (ref 134–144)
Total Protein: 8.1 g/dL (ref 6.0–8.5)
eGFR: 32 mL/min/{1.73_m2} — ABNORMAL LOW (ref >59)

## 2022-06-20 LAB — MICROALBUMIN / CREATININE URINE RATIO
Creatinine, Urine: 140.6 mg/dL
Microalb/Creat Ratio: 7 mg/g creat (ref 0–29)
Microalbumin, Urine: 9.3 ug/mL

## 2022-06-20 MED ORDER — ATORVASTATIN CALCIUM 20 MG PO TABS: 20.0000 mg | ORAL_TABLET | Freq: Every day | ORAL | 3 refills | Status: DC

## 2022-06-26 ENCOUNTER — Ambulatory Visit (INDEPENDENT_AMBULATORY_CARE_PROVIDER_SITE_OTHER): Payer: Medicare Other | Admitting: Bariatrics

## 2022-07-02 ENCOUNTER — Encounter: Payer: Self-pay | Admitting: Internal Medicine

## 2022-07-04 ENCOUNTER — Other Ambulatory Visit: Payer: Medicare Other

## 2022-07-04 DIAGNOSIS — N1832 Chronic kidney disease, stage 3b: Secondary | ICD-10-CM | POA: Diagnosis not present

## 2022-07-05 LAB — BASIC METABOLIC PANEL
BUN/Creatinine Ratio: 27 (ref 12–28)
BUN: 45 mg/dL — ABNORMAL HIGH (ref 8–27)
CO2: 22 mmol/L (ref 20–29)
Calcium: 10 mg/dL (ref 8.7–10.3)
Chloride: 103 mmol/L (ref 96–106)
Creatinine, Ser: 1.69 mg/dL — ABNORMAL HIGH (ref 0.57–1.00)
Glucose: 121 mg/dL — ABNORMAL HIGH (ref 70–99)
Potassium: 4.8 mmol/L (ref 3.5–5.2)
Sodium: 141 mmol/L (ref 134–144)
eGFR: 31 mL/min/{1.73_m2} — ABNORMAL LOW (ref >59)

## 2022-07-07 ENCOUNTER — Encounter (INDEPENDENT_AMBULATORY_CARE_PROVIDER_SITE_OTHER): Payer: Self-pay | Admitting: Adult Health

## 2022-07-07 ENCOUNTER — Other Ambulatory Visit (INDEPENDENT_AMBULATORY_CARE_PROVIDER_SITE_OTHER): Payer: Self-pay | Admitting: Adult Health

## 2022-07-07 MED ORDER — LOSARTAN POTASSIUM 100 MG PO TABS: 100.0000 mg | ORAL_TABLET | Freq: Every day | ORAL | 0 refills | Status: DC

## 2022-07-16 ENCOUNTER — Encounter (INDEPENDENT_AMBULATORY_CARE_PROVIDER_SITE_OTHER): Payer: Self-pay | Admitting: Adult Health

## 2022-07-16 ENCOUNTER — Ambulatory Visit (INDEPENDENT_AMBULATORY_CARE_PROVIDER_SITE_OTHER): Payer: Medicare Other | Admitting: Adult Health

## 2022-07-16 VITALS — BP 128/86 | HR 72 | Temp 98.3°F | Ht 63.0 in | Wt 205.0 lb

## 2022-07-16 DIAGNOSIS — E1169 Type 2 diabetes mellitus with other specified complication: Secondary | ICD-10-CM | POA: Diagnosis not present

## 2022-07-16 DIAGNOSIS — Z6836 Body mass index (BMI) 36.0-36.9, adult: Secondary | ICD-10-CM

## 2022-07-16 DIAGNOSIS — Z7984 Long term (current) use of oral hypoglycemic drugs: Secondary | ICD-10-CM | POA: Diagnosis not present

## 2022-07-16 DIAGNOSIS — N1832 Chronic kidney disease, stage 3b: Secondary | ICD-10-CM | POA: Diagnosis not present

## 2022-07-16 DIAGNOSIS — E669 Obesity, unspecified: Secondary | ICD-10-CM

## 2022-07-16 DIAGNOSIS — E559 Vitamin D deficiency, unspecified: Secondary | ICD-10-CM

## 2022-07-16 MED ORDER — SEMAGLUTIDE (1 MG/DOSE) 4 MG/3ML ~~LOC~~ SOPN: 1.0000 mg | PEN_INJECTOR | SUBCUTANEOUS | 0 refills | Status: DC

## 2022-07-16 MED ORDER — VITAMIN D (ERGOCALCIFEROL) 1.25 MG (50000 UNIT) PO CAPS: 50000.0000 [IU] | ORAL_CAPSULE | ORAL | 0 refills | Status: DC

## 2022-07-19 NOTE — Progress Notes (Unsigned)
Chief Complaint:   OBESITY April Matthews is here to discuss her progress with her obesity treatment plan along with follow-up of her obesity related diagnoses. April Matthews is on the Category 1 Plan and states she is following her eating plan approximately 65% of the time. April Matthews states she is swimming 30 minutes 2 times per week.  Today's visit was #: 70 Starting weight: 205 lbs Starting date: 05/09/2020 Today's weight: 205 lbs Today's date: 07/16/2022 Total lbs lost to date: 0 Total lbs lost since last in-office visit: 2 lbs  Interim History: She has four doses of Ozempic 1 mg ***.  She denies chest pain, dyspnea, lower extremity edema.    Subjective:   1. Vitamin D deficiency 05/21/2022, Ergocalciferol 50,000 IU every 14 days.   2. Type 2 diabetes mellitus with other specified complication, without long-term current use of insulin ([place]) She is on Metformin XR 500 mg 2 tablets at night time.  Ozempic 1 mg ***  3. Stage 3b chronic kidney disease ([place]) Due to increased creatinine and BUN, low dose HCTZ 12.5 mg stopped on 07/07/2022.  She has remained on losartan 100 mg daily and metoprolol succinate 25 mg daily.  Blood pressure at goal.   Assessment/Plan:   1. Vitamin D deficiency Refill - Vitamin D, Ergocalciferol, (DRISDOL) 1.25 MG (50000 UNIT) CAPS capsule; Take 1 capsule (50,000 Units total) by mouth every 14 (fourteen) days.  Dispense: 6 capsule; Refill: 0  2. Type 2 diabetes mellitus with other specified complication, without long-term current use of insulin (HCC) Refill - Semaglutide, 1 MG/DOSE, 4 MG/3ML SOPN; Inject 1 mg as directed once a week.  Dispense: 3 mL; Refill: 0  3. Stage 3b chronic kidney disease ([place]) Continue current antihypertensive.  Check CMP at next office visit. Remain well hydrated and avoid NSAID's.   4. Obesity, Current BMI 36.4 Check CMP at next office visit.   April Matthews is currently in the action stage of change. As such, her goal is to continue with  weight loss efforts. She has agreed to the Category 1 Plan.   Exercise goals:  As is.   Behavioral modification strategies: increasing lean protein intake, decreasing simple carbohydrates, meal planning and cooking strategies, keeping healthy foods in the home, and planning for success.  April Matthews has agreed to follow-up with our clinic in 4 weeks. She was informed of the importance of frequent follow-up visits to maximize her success with intensive lifestyle modifications for her multiple health conditions.   Objective:   Blood pressure 128/86, pulse 72, temperature 98.3 F (36.8 C), height '5\' 3"'$  (1.6 m), weight 205 lb (93 kg), SpO2 97 %. Body mass index is 36.31 kg/m.  General: Cooperative, alert, well developed, in no acute distress. HEENT: Conjunctivae and lids unremarkable. Cardiovascular: Regular rhythm.  Lungs: Normal work of breathing. Neurologic: No focal deficits.   Lab Results  Component Value Date   CREATININE 1.69 (H) 07/04/2022   BUN 45 (H) 07/04/2022   NA 141 07/04/2022   K 4.8 07/04/2022   CL 103 07/04/2022   CO2 22 07/04/2022   Lab Results  Component Value Date   ALT 19 06/19/2022   AST 18 06/19/2022   ALKPHOS 69 06/19/2022   BILITOT 0.4 06/19/2022   Lab Results  Component Value Date   HGBA1C 6.6 (H) 05/21/2022   HGBA1C 6.8 (H) 01/09/2022   HGBA1C 6.2 (A) 10/02/2021   HGBA1C 6.5 (H) 06/10/2021   HGBA1C 6.5 (H) 01/28/2021   Lab Results  Component Value Date  INSULIN 10.2 01/09/2022   INSULIN 15.3 06/10/2021   INSULIN 14.9 01/28/2021   INSULIN 25.5 (H) 05/09/2020   Lab Results  Component Value Date   TSH 2.990 10/02/2021   Lab Results  Component Value Date   CHOL 132 06/19/2022   HDL 54 06/19/2022   LDLCALC 53 06/19/2022   TRIG 146 06/19/2022   CHOLHDL 2.4 06/19/2022   Lab Results  Component Value Date   VD25OH 61.0 05/21/2022   VD25OH 59.8 01/09/2022   VD25OH 47.9 06/10/2021   Lab Results  Component Value Date   WBC 6.7 10/02/2021    HGB 13.0 10/02/2021   HCT 37.9 10/02/2021   MCV 95 10/02/2021   PLT 331 10/02/2021   No results found for: "IRON", "TIBC", "FERRITIN"  Obesity Behavioral Intervention:   Approximately 15 minutes were spent on the discussion below.  ASK: We discussed the diagnosis of obesity with April Matthews today and April Matthews agreed to give [place] permission to discuss obesity behavioral modification therapy today.  ASSESS: April Matthews has the diagnosis of obesity and her BMI today is 36.4. April Matthews is in the action stage of change.   ADVISE: April Matthews was educated on the multiple health risks of obesity as well as the benefit of weight loss to improve her health. She was advised of the need for long term treatment and the importance of lifestyle modifications to improve her current health and to decrease her risk of future health problems.  AGREE: Multiple dietary modification options and treatment options were discussed and April Matthews agreed to follow the recommendations documented in the above note.  ARRANGE: April Matthews was educated on the importance of frequent visits to treat obesity as outlined per CMS and USPSTF guidelines and agreed to schedule her next follow up appointment today.  Attestation Statements:   Reviewed by clinician on day of visit: allergies, medications, problem list, medical history, surgical history, family history, social history, and previous encounter notes.  I, Davy Pique, RMA, am acting as Location manager for Mina Marble, NP.  I have reviewed the above documentation for accuracy and completeness, and I agree with the above. -  ***

## 2022-07-29 DIAGNOSIS — L4050 Arthropathic psoriasis, unspecified: Secondary | ICD-10-CM | POA: Diagnosis not present

## 2022-08-05 ENCOUNTER — Encounter: Payer: Self-pay | Admitting: Internal Medicine

## 2022-08-05 DIAGNOSIS — Z23 Encounter for immunization: Secondary | ICD-10-CM | POA: Diagnosis not present

## 2022-08-11 ENCOUNTER — Ambulatory Visit (INDEPENDENT_AMBULATORY_CARE_PROVIDER_SITE_OTHER): Payer: Medicare Other | Admitting: Family Medicine

## 2022-08-11 ENCOUNTER — Encounter: Payer: Self-pay | Admitting: Family Medicine

## 2022-08-11 VITALS — BP 136/80 | HR 72 | Temp 97.8°F | Ht 63.0 in | Wt 209.8 lb

## 2022-08-11 DIAGNOSIS — R109 Unspecified abdominal pain: Secondary | ICD-10-CM

## 2022-08-11 DIAGNOSIS — E1169 Type 2 diabetes mellitus with other specified complication: Secondary | ICD-10-CM

## 2022-08-11 DIAGNOSIS — R1031 Right lower quadrant pain: Secondary | ICD-10-CM | POA: Diagnosis not present

## 2022-08-11 LAB — CBC WITH DIFFERENTIAL/PLATELET
Basophils Absolute: 0.1 10*3/uL (ref 0.0–0.2)
Basos: 1 %
EOS (ABSOLUTE): 0.2 10*3/uL (ref 0.0–0.4)
Eos: 3 %
Hematocrit: 37.9 % (ref 34.0–46.6)
Hemoglobin: 12.6 g/dL (ref 11.1–15.9)
Immature Grans (Abs): 0 10*3/uL (ref 0.0–0.1)
Immature Granulocytes: 0 %
Lymphocytes Absolute: 2.5 10*3/uL (ref 0.7–3.1)
Lymphs: 29 %
MCH: 32.8 pg (ref 26.6–33.0)
MCHC: 33.2 g/dL (ref 31.5–35.7)
MCV: 99 fL — ABNORMAL HIGH (ref 79–97)
Monocytes Absolute: 0.7 10*3/uL (ref 0.1–0.9)
Monocytes: 8 %
Neutrophils Absolute: 5 10*3/uL (ref 1.4–7.0)
Neutrophils: 59 %
Platelets: 372 10*3/uL (ref 150–450)
RBC: 3.84 x10E6/uL (ref 3.77–5.28)
RDW: 11.9 % (ref 11.7–15.4)
WBC: 8.5 10*3/uL (ref 3.4–10.8)

## 2022-08-11 LAB — POCT URINALYSIS DIP (PROADVANTAGE DEVICE)
Bilirubin, UA: NEGATIVE
Blood, UA: NEGATIVE
Glucose, UA: NEGATIVE mg/dL
Ketones, POC UA: NEGATIVE mg/dL
Nitrite, UA: NEGATIVE
Protein Ur, POC: NEGATIVE mg/dL
Specific Gravity, Urine: 1.01
Urobilinogen, Ur: NEGATIVE
pH, UA: 6 (ref 5.0–8.0)

## 2022-08-11 NOTE — Progress Notes (Signed)
Chief Complaint  Patient presents with   Abdominal Pain    Lower right side abdominal pain that started last night. She thought it might be gas, she took a Tums and it didn't help. Still bothering her today. Not any worse and not any better. Also took a gas x about an hour ago. Has appendicitis attack when she was 60.    She started with RLQ pain last night.  She thought it could be gas. She ate out last night, had small amount of cream sauce on the ravioli she ordered.  She took a Tums before bedtime, slept on that side and it didn't bother her, slept fine. She noticed persistent pain when she woke up and was walking around.  Normal bowels--last movement was yesterday. No urinary complaints. No vaginal discharge, bleeding or spotting. No change in activity or muscle soreness.  No f/c. No n/v Last Ozempic was on Friday.    Pain hasn't changed location, hasn't changed intensity.  Age 76--she recalls acute onset of severe RLQ pain. She was taken to ER, monitored overnight, better the next day.  Thought it was appendix.  Never had appendectomy.   PMH, PSH, SH reviewed Previous surgeries--umbilical hernia repair with mesh, c-section ([place]).  Outpatient Encounter Medications as of 08/11/2022  Medication Sig Note   atorvastatin (LIPITOR) 20 MG tablet Take 1 tablet (20 mg total) by mouth daily.    calcium carbonate (TUMS - DOSED IN MG ELEMENTAL CALCIUM) 500 MG chewable tablet Chew 1 tablet by mouth daily. 08/11/2022: Last dose 11pm   ipratropium (ATROVENT) 0.03 % nasal spray Place 2 sprays into both nostrils 2 (two) times daily.    losartan (COZAAR) 100 MG tablet Take 1 tablet (100 mg total) by mouth daily.    metFORMIN (GLUCOPHAGE-XR) 500 MG 24 hr tablet TAKE 2 TABLETS BY MOUTH AT NIGHT    metoprolol succinate (TOPROL-XL) 25 MG 24 hr tablet Take 1 tablet (25 mg total) by mouth daily.    Multiple Vitamin (MULTIVITAMIN) tablet Take 1 tablet by mouth daily.    Semaglutide, 1 MG/DOSE, 4  MG/3ML SOPN Inject 1 mg as directed once a week.    Simethicone (GAS-X PO) Take 1 capsule by mouth as needed. 08/11/2022: Took at 11am today   vitamin B-12 (CYANOCOBALAMIN) 1000 MCG tablet Take 1,000 mcg by mouth daily.    Vitamin D, Ergocalciferol, (DRISDOL) 1.25 MG (50000 UNIT) CAPS capsule Take 1 capsule (50,000 Units total) by mouth every 14 (fourteen) days.    EPINEPHrine 0.3 mg/0.3 mL IJ SOAJ injection Inject 0.3 mg into the muscle as needed for anaphylaxis. (Patient not taking: Reported on 08/11/2022)    No facility-administered encounter medications on file as of 08/11/2022.   Allergies  Allergen Reactions   Sesame Oil Anaphylaxis    Sesame seed/oil   Ace Inhibitors Cough   Bee Venom    Latex Rash   ROS: no fever, chills, URI symptoms, headache, dizziness, chest pain, shortness of breath. No n/v/d, bowel are normal. No urinary complaints, vaginal discharge. See HPI   PHYSICAL EXAM:  BP 136/80   Pulse 72   Temp 97.8 F (36.6 C) (Tympanic)   Ht '5\' 3"'$  (1.6 m)   Wt 209 lb 12.8 oz (95.2 kg)   BMI 37.16 kg/m   Wt Readings from Last 3 Encounters:  08/11/22 209 lb 12.8 oz (95.2 kg)  07/16/22 205 lb (93 kg)  06/19/22 210 lb 9.6 oz (95.5 kg)    Pleasant, well-appearing female in no distress. She  appears comfortable HEENT: conjunctiva and sclera are clear. EOMI. Neck: no lymphadenopathy, thyromegaly or mass Heart: regular rate and rhythm Lungs: clear bilaterally Abdomen: soft, normal bowel sounds. TTP at RLQ, no rebound tenderness or guarding.  No masses.  Nontender elsewhere. No pain with psoas testing.  Doesn't seem to be muscular. Extremities: no edema, normal pulses Skin: no visible rashes Psych: normal mood, affect, hygiene and grooming Neuro: alert and oriented, cranial nerves grossly intact, normal gait, strength.  Urine dip: Trace leuks, otherwise negative   ASSESSMENT/PLAN:   RLQ abdominal pain - no rebound or guarding.  Ddx includes appendicities.  Check  CBC. f/u if increasing pain, n/v, fever, or other changes. Consider CT if persistent/worsening  - Plan: CBC with Differential/Platelet  Abdominal pain, unspecified abdominal location - Plan: POCT Urinalysis DIP (Proadvantage Device)  Type 2 diabetes mellitus with other specified complication, without long-term current use of insulin (HCC) - controlled on current regimen (A1c 6.6% in July)

## 2022-08-12 ENCOUNTER — Telehealth: Payer: Self-pay | Admitting: Licensed Clinical Social Worker

## 2022-08-12 NOTE — Patient Outreach (Signed)
  Care Coordination   Initial Visit Note   08/12/2022 Name: April Matthews MRN: [id number] DOB: 06-03-1946  April Matthews is a 76 y.o. year old female who sees April Ohara, MD for primary care. I spoke with  April Matthews by phone today.  What matters to the patients health and wellness today?  Care Coordination    Goals Addressed             This Visit's Progress    COMPLETED: Care Coordination Activities-No Follow Up Required       Care Coordination Interventions: Active listening / Reflection utilized  LCSW informed patient of care coordination services. Pt is not interested at this time and agreed to contact PCP, should needs arise  LCSW reviewed upcoming appts         SDOH assessments and interventions completed:  Yes  SDOH Interventions Today    Flowsheet Row Most Recent Value  SDOH Interventions   Housing Interventions Intervention Not Indicated  Transportation Interventions Intervention Not Indicated        Care Coordination Interventions Activated:  Yes  Care Coordination Interventions:  Yes, provided   Follow up plan: No further intervention required.   Encounter Outcome:  Pt. Refused   Christa See, MSW, [email or web address] Phone [phone] 11:30 AM

## 2022-08-12 NOTE — Patient Instructions (Signed)
Visit Information  Thank you for taking time to visit with me today. Please don't hesitate to contact me if I can be of assistance to you.   Following are the goals we discussed today:   Goals Addressed             This Visit's Progress    COMPLETED: Care Coordination Activities-No Follow Up Required       Care Coordination Interventions: Active listening / Reflection utilized  LCSW informed patient of care coordination services. Pt is not interested at this time and agreed to contact PCP, should needs arise  LCSW reviewed upcoming appts        If you are experiencing a Mental Health or [place] or need someone to talk to, please call the Suicide and Crisis Lifeline: 988 call 911   Patient verbalizes understanding of instructions and care plan provided today and agrees to view in [place]. Active MyChart status and patient understanding of how to access instructions and care plan via MyChart confirmed with patient.     No further follow up required:    Christa See, MSW, [email or web address] Phone [phone] 11:31 AM

## 2022-08-13 ENCOUNTER — Encounter (INDEPENDENT_AMBULATORY_CARE_PROVIDER_SITE_OTHER): Payer: Self-pay

## 2022-08-13 ENCOUNTER — Ambulatory Visit (INDEPENDENT_AMBULATORY_CARE_PROVIDER_SITE_OTHER): Payer: Medicare Other | Admitting: Adult Health

## 2022-08-13 ENCOUNTER — Encounter: Payer: Self-pay | Admitting: *Deleted

## 2022-08-18 ENCOUNTER — Other Ambulatory Visit (INDEPENDENT_AMBULATORY_CARE_PROVIDER_SITE_OTHER): Payer: Self-pay | Admitting: Adult Health

## 2022-08-18 ENCOUNTER — Ambulatory Visit (INDEPENDENT_AMBULATORY_CARE_PROVIDER_SITE_OTHER): Payer: Medicare Other | Admitting: Adult Health

## 2022-08-18 ENCOUNTER — Encounter (INDEPENDENT_AMBULATORY_CARE_PROVIDER_SITE_OTHER): Payer: Self-pay | Admitting: Adult Health

## 2022-08-18 VITALS — BP 136/78 | HR 70 | Temp 98.1°F | Ht 63.0 in | Wt 205.0 lb

## 2022-08-18 DIAGNOSIS — E669 Obesity, unspecified: Secondary | ICD-10-CM

## 2022-08-18 DIAGNOSIS — R1031 Right lower quadrant pain: Secondary | ICD-10-CM | POA: Diagnosis not present

## 2022-08-18 DIAGNOSIS — Z7984 Long term (current) use of oral hypoglycemic drugs: Secondary | ICD-10-CM

## 2022-08-18 DIAGNOSIS — E1169 Type 2 diabetes mellitus with other specified complication: Secondary | ICD-10-CM | POA: Diagnosis not present

## 2022-08-18 DIAGNOSIS — E559 Vitamin D deficiency, unspecified: Secondary | ICD-10-CM

## 2022-08-18 DIAGNOSIS — Z6836 Body mass index (BMI) 36.0-36.9, adult: Secondary | ICD-10-CM | POA: Diagnosis not present

## 2022-08-18 DIAGNOSIS — N1832 Chronic kidney disease, stage 3b: Secondary | ICD-10-CM

## 2022-08-18 MED ORDER — SEMAGLUTIDE (1 MG/DOSE) 4 MG/3ML ~~LOC~~ SOPN: 1.0000 mg | PEN_INJECTOR | SUBCUTANEOUS | 0 refills | Status: DC

## 2022-08-19 ENCOUNTER — Other Ambulatory Visit (INDEPENDENT_AMBULATORY_CARE_PROVIDER_SITE_OTHER): Payer: Self-pay | Admitting: Adult Health

## 2022-08-19 ENCOUNTER — Encounter (INDEPENDENT_AMBULATORY_CARE_PROVIDER_SITE_OTHER): Payer: Self-pay | Admitting: Adult Health

## 2022-08-19 DIAGNOSIS — E559 Vitamin D deficiency, unspecified: Secondary | ICD-10-CM

## 2022-08-19 LAB — COMPREHENSIVE METABOLIC PANEL
ALT: 16 IU/L (ref 0–32)
AST: 18 IU/L (ref 0–40)
Albumin/Globulin Ratio: 1.7 (ref 1.2–2.2)
Albumin: 4.7 g/dL (ref 3.8–4.8)
Alkaline Phosphatase: 66 IU/L (ref 44–121)
BUN/Creatinine Ratio: 20 (ref 12–28)
BUN: 26 mg/dL (ref 8–27)
Bilirubin Total: 0.3 mg/dL (ref 0.0–1.2)
CO2: 22 mmol/L (ref 20–29)
Calcium: 10.4 mg/dL — ABNORMAL HIGH (ref 8.7–10.3)
Chloride: 104 mmol/L (ref 96–106)
Creatinine, Ser: 1.31 mg/dL — ABNORMAL HIGH (ref 0.57–1.00)
Globulin, Total: 2.8 g/dL (ref 1.5–4.5)
Glucose: 124 mg/dL — ABNORMAL HIGH (ref 70–99)
Potassium: 4.4 mmol/L (ref 3.5–5.2)
Sodium: 143 mmol/L (ref 134–144)
Total Protein: 7.5 g/dL (ref 6.0–8.5)
eGFR: 42 mL/min/{1.73_m2} — ABNORMAL LOW (ref >59)

## 2022-08-19 LAB — VITAMIN D 25 HYDROXY (VIT D DEFICIENCY, FRACTURES): Vit D, 25-Hydroxy: 44.6 ng/mL (ref 30.0–100.0)

## 2022-08-19 LAB — HEMOGLOBIN A1C
Est. average glucose Bld gHb Est-mCnc: 146 mg/dL
Hgb A1c MFr Bld: 6.7 % — ABNORMAL HIGH (ref 4.8–5.6)

## 2022-08-19 MED ORDER — VITAMIN D (ERGOCALCIFEROL) 1.25 MG (50000 UNIT) PO CAPS: 50000.0000 [IU] | ORAL_CAPSULE | ORAL | 0 refills | Status: DC

## 2022-08-21 ENCOUNTER — Encounter (INDEPENDENT_AMBULATORY_CARE_PROVIDER_SITE_OTHER): Payer: Self-pay | Admitting: Adult Health

## 2022-08-22 DIAGNOSIS — R1031 Right lower quadrant pain: Secondary | ICD-10-CM | POA: Insufficient documentation

## 2022-08-22 NOTE — Progress Notes (Unsigned)
Chief Complaint:   OBESITY April Matthews is here to discuss her progress with her obesity treatment plan along with follow-up of her obesity related diagnoses. April Matthews is on the Category 1 Plan and states she is following her eating plan approximately 60% of the time. [name] states she is walking 10,000 steps daily 7 times per week.  Today's visit was #: 45 Starting weight: 205 lbs Starting date: 05/09/2020 Today's weight: 205 lbs Today's date: 08/18/2022 Total lbs lost to date: 0 Total lbs lost since last in-office visit: 0  Interim History: Recent office visit with PCP, has RLQ pain.  She denies current acute symptoms.   Subjective:   1. Stage 3b chronic kidney disease (HCC) Losartan/HCTZ 100/12.5  mg changed to plain Losartan 100 mg daily on 07/07/2022, due to worsening CKD.   2. Type 2 diabetes mellitus with other specified complication, without long-term current use of insulin (HCC) A1c *** Fasting complete blood glucose ***  3. Vitamin D deficiency She has been out of ergocalciferol for 2 weeks.   4. RLQ abdominal pain ***   Assessment/Plan:   1. Stage 3b chronic kidney disease ([place]) Check labs today, my chart patient with results.   - Comprehensive metabolic panel  2. Type 2 diabetes mellitus with other specified complication, without long-term current use of insulin (HCC) Refill - Semaglutide, 1 MG/DOSE, 4 MG/3ML SOPN; Inject 1 mg as directed once a week.  Dispense: 3 mL; Refill: 0  3. Vitamin D deficiency Check labs then my chart patient with results.   - VITAMIN D 25 Hydroxy (Vit-D Deficiency, Fractures)  4. RLQ abdominal pain Follow up with PCP as needed.   5. Obesity, Current BMI 36.5 April Matthews is currently in the action stage of change. As such, her goal is to continue with weight loss efforts. She has agreed to the Category 1 Plan.   Exercise goals:  As is.  Behavioral modification strategies: increasing lean protein intake, decreasing simple  carbohydrates, meal planning and cooking strategies, keeping healthy foods in the home, and planning for success.  April Matthews has agreed to follow-up with our clinic in 4 weeks. She was informed of the importance of frequent follow-up visits to maximize her success with intensive lifestyle modifications for her multiple health conditions.   April Matthews was informed we would discuss her lab results at her next visit unless there is a critical issue that needs to be addressed sooner. April Matthews agreed to keep her next visit at the agreed upon time to discuss these results.  Objective:   Blood pressure 136/78, pulse 70, temperature 98.1 F (36.7 C), height '5\' 3"'$  (1.6 m), weight 205 lb (93 kg), SpO2 95 %. Body mass index is 36.31 kg/m.  General: Cooperative, alert, well developed, in no acute distress. HEENT: Conjunctivae and lids unremarkable. Cardiovascular: Regular rhythm.  Lungs: Normal work of breathing. Neurologic: No focal deficits.   Lab Results  Component Value Date   CREATININE 1.31 (H) 08/18/2022   BUN 26 08/18/2022   NA 143 08/18/2022   K 4.4 08/18/2022   CL 104 08/18/2022   CO2 22 08/18/2022   Lab Results  Component Value Date   ALT 16 08/18/2022   AST 18 08/18/2022   ALKPHOS 66 08/18/2022   BILITOT 0.3 08/18/2022   Lab Results  Component Value Date   HGBA1C 6.7 (H) 08/18/2022   HGBA1C 6.6 (H) 05/21/2022   HGBA1C 6.8 (H) 01/09/2022   HGBA1C 6.2 (A) 10/02/2021   HGBA1C 6.5 (H) 06/10/2021  Lab Results  Component Value Date   INSULIN 10.2 01/09/2022   INSULIN 15.3 06/10/2021   INSULIN 14.9 01/28/2021   INSULIN 25.5 (H) 05/09/2020   Lab Results  Component Value Date   TSH 2.990 10/02/2021   Lab Results  Component Value Date   CHOL 132 06/19/2022   HDL 54 06/19/2022   LDLCALC 53 06/19/2022   TRIG 146 06/19/2022   CHOLHDL 2.4 06/19/2022   Lab Results  Component Value Date   VD25OH 44.6 08/18/2022   VD25OH 61.0 05/21/2022   VD25OH 59.8 01/09/2022   Lab  Results  Component Value Date   WBC 8.5 08/11/2022   HGB 12.6 08/11/2022   HCT 37.9 08/11/2022   MCV 99 (H) 08/11/2022   PLT 372 08/11/2022   No results found for: "IRON", "TIBC", "FERRITIN"  Attestation Statements:   Reviewed by clinician on day of visit: allergies, medications, problem list, medical history, surgical history, family history, social history, and previous encounter notes.  I, Davy Pique, RMA, am acting as Location manager for Mina Marble, NP.  I have reviewed the above documentation for accuracy and completeness, and I agree with the above. -  ***

## 2022-09-25 ENCOUNTER — Ambulatory Visit (INDEPENDENT_AMBULATORY_CARE_PROVIDER_SITE_OTHER): Payer: Medicare Other | Admitting: Family Medicine

## 2022-09-26 DIAGNOSIS — L4050 Arthropathic psoriasis, unspecified: Secondary | ICD-10-CM | POA: Diagnosis not present

## 2022-09-30 ENCOUNTER — Other Ambulatory Visit: Payer: Self-pay | Admitting: Family Medicine

## 2022-10-01 ENCOUNTER — Telehealth (INDEPENDENT_AMBULATORY_CARE_PROVIDER_SITE_OTHER): Payer: Self-pay

## 2022-10-01 ENCOUNTER — Encounter (INDEPENDENT_AMBULATORY_CARE_PROVIDER_SITE_OTHER): Payer: Self-pay | Admitting: Family Medicine

## 2022-10-01 ENCOUNTER — Ambulatory Visit (INDEPENDENT_AMBULATORY_CARE_PROVIDER_SITE_OTHER): Payer: Medicare Other | Admitting: Family Medicine

## 2022-10-01 VITALS — BP 170/98 | HR 68 | Temp 97.7°F | Ht 63.0 in | Wt 202.4 lb

## 2022-10-01 DIAGNOSIS — E1169 Type 2 diabetes mellitus with other specified complication: Secondary | ICD-10-CM

## 2022-10-01 DIAGNOSIS — E1159 Type 2 diabetes mellitus with other circulatory complications: Secondary | ICD-10-CM | POA: Diagnosis not present

## 2022-10-01 DIAGNOSIS — I152 Hypertension secondary to endocrine disorders: Secondary | ICD-10-CM

## 2022-10-01 DIAGNOSIS — E1069 Type 1 diabetes mellitus with other specified complication: Secondary | ICD-10-CM | POA: Insufficient documentation

## 2022-10-01 DIAGNOSIS — E669 Obesity, unspecified: Secondary | ICD-10-CM

## 2022-10-01 DIAGNOSIS — E109 Type 1 diabetes mellitus without complications: Secondary | ICD-10-CM | POA: Insufficient documentation

## 2022-10-01 DIAGNOSIS — Z7985 Long-term (current) use of injectable non-insulin antidiabetic drugs: Secondary | ICD-10-CM | POA: Diagnosis not present

## 2022-10-01 DIAGNOSIS — Z6835 Body mass index (BMI) 35.0-35.9, adult: Secondary | ICD-10-CM | POA: Diagnosis not present

## 2022-10-01 DIAGNOSIS — E559 Vitamin D deficiency, unspecified: Secondary | ICD-10-CM

## 2022-10-01 MED ORDER — TIRZEPATIDE 5 MG/0.5ML ~~LOC~~ SOAJ: 5.0000 mg | SUBCUTANEOUS | 0 refills | Status: DC

## 2022-10-01 MED ORDER — VITAMIN D (ERGOCALCIFEROL) 1.25 MG (50000 UNIT) PO CAPS: 50000.0000 [IU] | ORAL_CAPSULE | ORAL | 0 refills | Status: DC

## 2022-10-01 NOTE — Telephone Encounter (Signed)
Spoke with [place] and Elenore Rota with [employer] rx prior [employer], started auth for mounjaro 0.'5mg'$  weekly.  Phone: [phone] Case #:[id number] They will send updates thru covermy meds.

## 2022-10-02 NOTE — Telephone Encounter (Signed)
Fax received: Mounjaro denied, non-formulary. Fax sent to scan.

## 2022-10-07 NOTE — Progress Notes (Signed)
Chief Complaint:   OBESITY April Matthews is here to discuss her progress with her obesity treatment plan along with follow-up of her obesity related diagnoses. April Matthews is on the Category 1 Plan and states she is following her eating plan approximately 70% of the time. April Matthews states she is walking 30 minutes 7 times per week.  Today's visit was #: 80 Starting weight: 205 LBS Starting date: 05/09/2020 Today's weight: 202 LBS Today's date: 10/01/2022 Total lbs lost to date: 3 LBS Total lbs lost since last in-office visit: 3 LBS  Interim History: April Matthews is here for a follow up office visit.  We reviewed her meal plan and all questions were answered.  Patient's food recall appears to be accurate and consistent with what is on plan when she is following it.   When eating on plan, her hunger and cravings are well controlled.     Subjective:   1. Hypertension associated with type 2 diabetes mellitus ([place]) Patient is without new symptoms of hypertensive.  She is taking meds as prescribed.  No recent changes.  No new decongestants or other medications.  2. Type 2 diabetes mellitus with obesity ([place]) 08/18/2022, A1c was 6.7.  Patient is taking Ozempic every Friday and is on metformin.  Patient's A1c has gone up on Ozempic plus have minor GI upset the first day after injection.  3. Vitamin D deficiency Last vitamin D level 44.6 on 08/18/2022.  Assessment/Plan:  No orders of the defined types were placed in this encounter.   Medications Discontinued During This Encounter  Medication Reason   Vitamin D, Ergocalciferol, (DRISDOL) 1.25 MG (50000 UNIT) CAPS capsule Reorder     Meds ordered this encounter  Medications   Vitamin D, Ergocalciferol, (DRISDOL) 1.25 MG (50000 UNIT) CAPS capsule    Sig: Take 1 capsule (50,000 Units total) by mouth every 14 (fourteen) days.    Dispense:  6 capsule    Refill:  0   tirzepatide (MOUNJARO) 5 MG/0.5ML Pen    Sig: Inject 5 mg into the skin once a  week.    Dispense:  2 mL    Refill:  0     1. Hypertension associated with type 2 diabetes mellitus ([place]) Blood pressure is not at goal.  Check blood pressures at home daily, randomly.  Red flag symptoms discussed with patient.  Continue medications per PCP make follow-up with her as soon as possible.  2. Type 2 diabetes mellitus with obesity ([place]) Discontinue Ozempic.  Risk and benefits of medications discussed with patient.  Insurance requires prior authorization now for her Ozempic.  Since she has not seen great effect on medication in regards to her A1c and weight loss.  We will change treatment to [facility].  Start- tirzepatide [facility]) 5 MG/0.5ML Pen; Inject 5 mg into the skin once a week.  Dispense: 2 mL; Refill: 0  3. Vitamin D deficiency Low Vitamin D level contributes to fatigue and are associated with obesity, breast, and colon cancer. She agrees to continue to take prescription Vitamin D '@50'$ ,000 IU every week and will follow-up for routine testing of Vitamin D, at least 2-3 times per year to avoid over-replacement.  Refill- Vitamin D, Ergocalciferol, (DRISDOL) 1.25 MG (50000 UNIT) CAPS capsule; Take 1 capsule (50,000 Units total) by mouth every 14 (fourteen) days.  Dispense: 6 capsule; Refill: 0  4. Obesity, Current BMI 35.9 April Matthews is currently in the action stage of change. As such, her goal is to continue with weight loss efforts.  She has agreed to the Category 1 Plan.   Exercise goals:  As is.  Behavioral modification strategies: increasing lean protein intake, decreasing simple carbohydrates, and holiday eating strategies .  April Matthews has agreed to follow-up with our clinic in 4 weeks. She was informed of the importance of frequent follow-up visits to maximize her success with intensive lifestyle modifications for her multiple health conditions.   Objective:   Blood pressure (!) 170/98, pulse 68, temperature 97.7 F (36.5 C), height '5\' 3"'$  (1.6 m), weight 202 lb 6.4 oz  (91.8 kg), SpO2 96 %. Body mass index is 35.85 kg/m.  General: Cooperative, alert, well developed, in no acute distress. HEENT: Conjunctivae and lids unremarkable. Cardiovascular: Regular rhythm.  Lungs: Normal work of breathing. Neurologic: No focal deficits.   Lab Results  Component Value Date   CREATININE 1.31 (H) 08/18/2022   BUN 26 08/18/2022   NA 143 08/18/2022   K 4.4 08/18/2022   CL 104 08/18/2022   CO2 22 08/18/2022   Lab Results  Component Value Date   ALT 16 08/18/2022   AST 18 08/18/2022   ALKPHOS 66 08/18/2022   BILITOT 0.3 08/18/2022   Lab Results  Component Value Date   HGBA1C 6.7 (H) 08/18/2022   HGBA1C 6.6 (H) 05/21/2022   HGBA1C 6.8 (H) 01/09/2022   HGBA1C 6.2 (A) 10/02/2021   HGBA1C 6.5 (H) 06/10/2021   Lab Results  Component Value Date   INSULIN 10.2 01/09/2022   INSULIN 15.3 06/10/2021   INSULIN 14.9 01/28/2021   INSULIN 25.5 (H) 05/09/2020   Lab Results  Component Value Date   TSH 2.990 10/02/2021   Lab Results  Component Value Date   CHOL 132 06/19/2022   HDL 54 06/19/2022   LDLCALC 53 06/19/2022   TRIG 146 06/19/2022   CHOLHDL 2.4 06/19/2022   Lab Results  Component Value Date   VD25OH 44.6 08/18/2022   VD25OH 61.0 05/21/2022   VD25OH 59.8 01/09/2022   Lab Results  Component Value Date   WBC 8.5 08/11/2022   HGB 12.6 08/11/2022   HCT 37.9 08/11/2022   MCV 99 (H) 08/11/2022   PLT 372 08/11/2022   No results found for: "IRON", "TIBC", "FERRITIN"  Attestation Statements:   Reviewed by clinician on day of visit: allergies, medications, problem list, medical history, surgical history, family history, social history, and previous encounter notes.  Time spent on visit including pre-visit chart review and post-visit care and charting was 40 minutes.   I, Davy Pique, RMA, am acting as Location manager for [employer], DO.  I have reviewed the above documentation for accuracy and completeness, and I agree with the above.  Marjory Sneddon, D.O.  The [place] was signed into law in 2016 which includes the topic of electronic health records.  This provides immediate access to information in MyChart.  This includes consultation notes, operative notes, office notes, lab results and pathology reports.  If you have any questions about what you read please let us know at your next visit so we can discuss your concerns and take corrective action if need be.  We are right here with you.

## 2022-10-13 ENCOUNTER — Encounter: Payer: Self-pay | Admitting: Family Medicine

## 2022-10-13 ENCOUNTER — Ambulatory Visit (INDEPENDENT_AMBULATORY_CARE_PROVIDER_SITE_OTHER): Payer: Medicare Other | Admitting: Family Medicine

## 2022-10-13 VITALS — BP 136/80 | HR 80 | Ht 63.0 in | Wt 208.4 lb

## 2022-10-13 DIAGNOSIS — E559 Vitamin D deficiency, unspecified: Secondary | ICD-10-CM | POA: Diagnosis not present

## 2022-10-13 DIAGNOSIS — R7989 Other specified abnormal findings of blood chemistry: Secondary | ICD-10-CM | POA: Diagnosis not present

## 2022-10-13 DIAGNOSIS — E1169 Type 2 diabetes mellitus with other specified complication: Secondary | ICD-10-CM

## 2022-10-13 DIAGNOSIS — L4050 Arthropathic psoriasis, unspecified: Secondary | ICD-10-CM | POA: Diagnosis not present

## 2022-10-13 DIAGNOSIS — E669 Obesity, unspecified: Secondary | ICD-10-CM

## 2022-10-13 DIAGNOSIS — Z6836 Body mass index (BMI) 36.0-36.9, adult: Secondary | ICD-10-CM

## 2022-10-13 DIAGNOSIS — M1991 Primary osteoarthritis, unspecified site: Secondary | ICD-10-CM | POA: Diagnosis not present

## 2022-10-13 DIAGNOSIS — Z79899 Other long term (current) drug therapy: Secondary | ICD-10-CM | POA: Diagnosis not present

## 2022-10-13 DIAGNOSIS — I10 Essential (primary) hypertension: Secondary | ICD-10-CM | POA: Diagnosis not present

## 2022-10-13 DIAGNOSIS — L401 Generalized pustular psoriasis: Secondary | ICD-10-CM | POA: Diagnosis not present

## 2022-10-13 DIAGNOSIS — M79605 Pain in left leg: Secondary | ICD-10-CM | POA: Diagnosis not present

## 2022-10-13 NOTE — Patient Instructions (Addendum)
Goal blood pressure is 130/80 or less. There may be a lot of variability related to stress (white coat component in various doctors offices), sodium intake, weight and exercise. Continue to monitor at your and record your values. I think we should recheck your monitor to verify the accuracy, since it was last done 5 years ago. You can bring this to a nurse visit, or to any other doctor visit.  Please wear compression socks more regularly to help prevent the swelling.  Please be very careful with the sodium in your diet.  This will contribute to higher blood pressures and swelling in your feet.   Your blood pressure was higher on recheck. Since your values at home seem very good, we need to know if your monitor is accurate. If your monitor is accurate and all of your home BP reading are <130/80, no adjustment is needed. If your blood pressure monitor isn't accurate, then we will increase your metoprolol. Or if your home BP's are consistently over 130/80 (with an accurate monitor).  Please send [place] in your list of blood pressures (with pulse) in 2 weeks, after verifying your monitor (you can bring your list to the nurse visit).

## 2022-10-13 NOTE — Progress Notes (Signed)
Chief Complaint  Patient presents with   Hypertension    Patient has been having high blood pressure readings over the last month. 190's/100. Was 200/93 this am at Dr. Amil Amen, she went home and took it and it was 118/83.    Pt had elevated BP noted at [facility] clinic on 12/6, 170/98.  Dr. Raliegh Scarlet recommended she f/u on her PCP. She was told to monitor her BP's at home, but then she got sick (viral GI illness), so never started. She had headaches related to her viral illness.  Headaches resolved.   She otherwise denies headaches, dizziness, chest pain. She does get swelling in her feet, better if she wears compression socks. Snacking more on tortilla chips recently.  BP was 200/93 at Dr. Melissa Noon office today. She rechecked it at home and it was 118/83 Monitor was verified as accurate in 2018.  She has diagnosis of HTN.   [facility] clinic changed losartan to losartan HCT in 12/2021, but Cr bumped up.  The HCTZ was stopped, Cr improved. She continues on losartan '100mg'$ , and metoprolol XL '25mg'$ .  BP at her physical 06/19/22 was 120/70 Pulse is usually 70's-80's.  BP Readings from Last 3 Encounters:  10/13/22 136/80  10/01/22 (!) 170/98  08/18/22 136/78   [facility] stopped her Ozempic, wasn't helping with her weight or diabetes. She was switched to [facility], but this hasn't been approved yet. She continues on metformin for diabetes.  Lab Results  Component Value Date   HGBA1C 6.7 (H) 08/18/2022   Lab Results  Component Value Date   CREATININE 1.31 (H) 08/18/2022    PMH, PSH, SH reviewed  Outpatient Encounter Medications as of 10/13/2022  Medication Sig Note   atorvastatin (LIPITOR) 20 MG tablet Take 1 tablet (20 mg total) by mouth daily.    calcium carbonate (TUMS - DOSED IN MG ELEMENTAL CALCIUM) 500 MG chewable tablet Chew 1 tablet by mouth daily. 10/13/2022: Alternates days with Gas X   losartan (COZAAR) 100 MG tablet Take 1 tablet (100 mg total) by mouth daily.    metFORMIN  (GLUCOPHAGE-XR) 500 MG 24 hr tablet TAKE 2 TABLETS BY MOUTH AT NIGHT    metoprolol succinate (TOPROL-XL) 25 MG 24 hr tablet Take 1 tablet (25 mg total) by mouth daily.    Multiple Vitamin (MULTIVITAMIN) tablet Take 1 tablet by mouth daily.    Simethicone (GAS-X PO) Take 1 capsule by mouth as needed. 10/13/2022: Alternates with Tums (every other day)   vitamin B-12 (CYANOCOBALAMIN) 1000 MCG tablet Take 1,000 mcg by mouth daily.    Vitamin D, Ergocalciferol, (DRISDOL) 1.25 MG (50000 UNIT) CAPS capsule Take 1 capsule (50,000 Units total) by mouth every 14 (fourteen) days.    EPINEPHrine 0.3 mg/0.3 mL IJ SOAJ injection Inject 0.3 mg into the muscle as needed for anaphylaxis. (Patient not taking: Reported on 10/13/2022)    ipratropium (ATROVENT) 0.03 % nasal spray USE 2 SPRAYS IN EACH NOSTRIL TWICE DAILY (Patient not taking: Reported on 10/13/2022) 10/13/2022: prn   Semaglutide, 1 MG/DOSE, 4 MG/3ML SOPN Inject 1 mg as directed once a week. (Patient not taking: Reported on 10/13/2022) 10/13/2022: Has not taken in 2 weeks(taken off of)   tirzepatide [facility]) 5 MG/0.5ML Pen Inject 5 mg into the skin once a week. (Patient not taking: Reported on 10/13/2022) 10/13/2022: Insurance would not appprove   No facility-administered encounter medications on file as of 10/13/2022.   Allergies  Allergen Reactions   Sesame Oil Anaphylaxis    Sesame seed/oil   Ace  Inhibitors Cough   Bee Venom    Latex Rash   ROS:  no fever, chills. Recent GI illness, resolved. Negative COVID test at school.  BP 136/80 (Cuff Size: Normal)   Pulse 80   Ht '5\' 3"'$  (1.6 m)   Wt 208 lb 6.4 oz (94.5 kg)   BMI 36.92 kg/m   164/80 on repeat by MD  Wt Readings from Last 3 Encounters:  10/13/22 208 lb 6.4 oz (94.5 kg)  10/01/22 202 lb 6.4 oz (91.8 kg)  08/18/22 205 lb (93 kg)   Pleasant, well-appearing, obese female in no distress HEENT: conjunctiva and sclera are clear, EOMI. Neck: no lymphadenopathy or mass Heart: regular  rate and rhythm Lungs: clear bilaterally Extremities:  Puffy feet/legs R>L Minimal pitting in feet only Psych: normal mood, affect, hygiene and grooming Neuro: alert and oriented, cranial nerves grossly intact. Normal gait   ASSESSMENT/PLAN:  Essential hypertension - Need to re-verify accuracy of home BP cuff.  Poss white coat HTN component.  Suspect Na intake contributes to fluctuations. Increase BB if BP high at home  Type 2 diabetes mellitus with obesity ([place]) - [facility] clinic is adjusting meds to help with wt loss (Ozempic not effective, changing to [facility], if covered). Cont metformin.  Class 2 severe obesity due to excess calories with serious comorbidity and body mass index (BMI) of 36.0 to 36.9 in adult [facility]) - Mounjaro likely to be more effective, if covered.  Needs to be more compliant with diet (good food choices and low sodium)  Goal blood pressure is 130/80 or less. There may be a lot of variability related to stress (white coat component in various doctors offices), sodium intake, weight and exercise. Continue to monitor at your and record your values. I think we should recheck your monitor to verify the accuracy, since it was last done 5 years ago. You can bring this to a nurse visit, or to any other doctor visit.  Please wear compression socks more regularly to help prevent the swelling.  Please be very careful with the sodium in your diet.  This will contribute to higher blood pressures and swelling in your feet.   Your blood pressure was higher on recheck. Since your values at home seem very good, we need to know if your monitor is accurate. If your monitor is accurate and all of your home BP reading are <130/80, no adjustment is needed. If your blood pressure monitor isn't accurate, then we will increase your metoprolol. Or if your home BP's are consistently over 130/80 (with an accurate monitor).  Please send [place] in your list of blood pressures (with pulse) in 2  weeks, after verifying your monitor (you can bring your list to the nurse visit).

## 2022-10-14 ENCOUNTER — Telehealth: Payer: Self-pay | Admitting: *Deleted

## 2022-10-14 ENCOUNTER — Other Ambulatory Visit: Payer: Medicare Other

## 2022-10-14 NOTE — Telephone Encounter (Signed)
Patient was in for [place] for bp monitor check. I checked patient blood pressure and it was 180/98. Machine was used right after on same arm (L) and it read 179/90.

## 2022-10-24 DIAGNOSIS — L918 Other hypertrophic disorders of the skin: Secondary | ICD-10-CM | POA: Diagnosis not present

## 2022-10-24 DIAGNOSIS — L821 Other seborrheic keratosis: Secondary | ICD-10-CM | POA: Diagnosis not present

## 2022-10-24 DIAGNOSIS — L400 Psoriasis vulgaris: Secondary | ICD-10-CM | POA: Diagnosis not present

## 2022-10-24 DIAGNOSIS — L245 Irritant contact dermatitis due to other chemical products: Secondary | ICD-10-CM | POA: Diagnosis not present

## 2022-11-05 ENCOUNTER — Ambulatory Visit (INDEPENDENT_AMBULATORY_CARE_PROVIDER_SITE_OTHER): Payer: Medicare Other | Admitting: Family Medicine

## 2022-11-05 ENCOUNTER — Encounter (INDEPENDENT_AMBULATORY_CARE_PROVIDER_SITE_OTHER): Payer: Self-pay | Admitting: Family Medicine

## 2022-11-05 VITALS — BP 179/95 | HR 67 | Temp 98.1°F | Ht 63.0 in | Wt 206.6 lb

## 2022-11-05 DIAGNOSIS — I152 Hypertension secondary to endocrine disorders: Secondary | ICD-10-CM

## 2022-11-05 DIAGNOSIS — E1169 Type 2 diabetes mellitus with other specified complication: Secondary | ICD-10-CM | POA: Diagnosis not present

## 2022-11-05 DIAGNOSIS — Z6836 Body mass index (BMI) 36.0-36.9, adult: Secondary | ICD-10-CM

## 2022-11-05 DIAGNOSIS — E669 Obesity, unspecified: Secondary | ICD-10-CM

## 2022-11-05 DIAGNOSIS — Z7985 Long-term (current) use of injectable non-insulin antidiabetic drugs: Secondary | ICD-10-CM

## 2022-11-05 DIAGNOSIS — E1159 Type 2 diabetes mellitus with other circulatory complications: Secondary | ICD-10-CM

## 2022-11-21 DIAGNOSIS — R5383 Other fatigue: Secondary | ICD-10-CM | POA: Diagnosis not present

## 2022-11-21 DIAGNOSIS — Z111 Encounter for screening for respiratory tuberculosis: Secondary | ICD-10-CM | POA: Diagnosis not present

## 2022-11-21 DIAGNOSIS — L4050 Arthropathic psoriasis, unspecified: Secondary | ICD-10-CM | POA: Diagnosis not present

## 2022-11-21 DIAGNOSIS — Z79899 Other long term (current) drug therapy: Secondary | ICD-10-CM | POA: Diagnosis not present

## 2022-11-21 NOTE — Progress Notes (Unsigned)
Chief Complaint:   OBESITY Sophonie is here to discuss her progress with her obesity treatment plan along with follow-up of her obesity related diagnoses. April Matthews is on the Category 1 Plan and states she is following her eating plan approximately 70% of the time. April Matthews states she is walking and biking 60 minutes 7 times per week.  Today's visit was #: 54 Starting weight: 205 lbs Starting date: 05/09/2020 Today's weight: 206 lbs Today's date: 11/05/2022 Total lbs lost to date: 0 Total lbs lost since last in-office visit: +4  Interim History: April Matthews was sick from Dec 7 until early January (couple days ago). She rested a lot but was not active. Pt finally feels back to normal. Her daughter, April Matthews, is an NP in [place]., and gives her mom a lot of advice.  Subjective:   1. Type 2 diabetes mellitus with obesity (HCC) A1c was 6.7 in 10/23. Pt went off Ozempic and changed to [facility], but she has not picked up the [facility] because it was initially not approved by insurance. However, she received an email recently and is now approved.  2. Hypertension associated with diabetes (April Matthews) April Matthews is working with her PCP, Dr. Tomi Bamberger, on BP. They are following it closely and changing meds currently.  Assessment/Plan:  No orders of the defined types were placed in this encounter.   Medications Discontinued During This Encounter  Medication Reason   Semaglutide, 1 MG/DOSE, 4 MG/3ML SOPN Completed Course     No orders of the defined types were placed in this encounter.    1. Type 2 diabetes mellitus with obesity (April Matthews) Start Mounjaro (has script). Extensive risks and benefits counseling done with pt. She is aware to follow meal plan very closely to prevent GI side effects on drug.  2. Hypertension associated with diabetes (April Matthews) BP not at goal. Continue with medication changes per PCP. Pt advised to obtain Omron Series 3 BP cuff and monitor BP at home. Pt has an appt with PCP in the near future. She  will bring BP log to PCP. Decrease salt, sugar (simple carbs). Increase water. Increase walking.  3. Obesity, Current BMI 36.6 April Matthews is currently in the action stage of change. As such, her goal is to continue with weight loss efforts. She has agreed to the Category 1 Plan with breakfast and lunch options.   Recipe Packet II and meal plan handout provided.  Exercise goals:  As is and increase as tolerated.  Increase walking to 5K steps per day.  Behavioral modification strategies: avoiding temptations and planning for success.  April Matthews has agreed to follow-up with our clinic in 3-4 weeks after starting Mounjaro. She was informed of the importance of frequent follow-up visits to maximize her success with intensive lifestyle modifications for her multiple health conditions.   Objective:   Blood pressure (!) 179/95, pulse 67, temperature 98.1 F (36.7 C), height '5\' 3"'$  (1.6 m), weight 206 lb 9.6 oz (93.7 kg), SpO2 95 %. Body mass index is 36.6 kg/m.  General: Cooperative, alert, well developed, in no acute distress. HEENT: Conjunctivae and lids unremarkable. Cardiovascular: Regular rhythm.  Lungs: Normal work of breathing. Neurologic: No focal deficits.   Lab Results  Component Value Date   CREATININE 1.31 (H) 08/18/2022   BUN 26 08/18/2022   NA 143 08/18/2022   K 4.4 08/18/2022   CL 104 08/18/2022   CO2 22 08/18/2022   Lab Results  Component Value Date   ALT 16 08/18/2022   AST 18 08/18/2022  ALKPHOS 66 08/18/2022   BILITOT 0.3 08/18/2022   Lab Results  Component Value Date   HGBA1C 6.7 (H) 08/18/2022   HGBA1C 6.6 (H) 05/21/2022   HGBA1C 6.8 (H) 01/09/2022   HGBA1C 6.2 (A) 10/02/2021   HGBA1C 6.5 (H) 06/10/2021   Lab Results  Component Value Date   INSULIN 10.2 01/09/2022   INSULIN 15.3 06/10/2021   INSULIN 14.9 01/28/2021   INSULIN 25.5 (H) 05/09/2020   Lab Results  Component Value Date   TSH 2.990 10/02/2021   Lab Results  Component Value Date   CHOL 132  06/19/2022   HDL 54 06/19/2022   LDLCALC 53 06/19/2022   TRIG 146 06/19/2022   CHOLHDL 2.4 06/19/2022   Lab Results  Component Value Date   VD25OH 44.6 08/18/2022   VD25OH 61.0 05/21/2022   VD25OH 59.8 01/09/2022   Lab Results  Component Value Date   WBC 8.5 08/11/2022   HGB 12.6 08/11/2022   HCT 37.9 08/11/2022   MCV 99 (H) 08/11/2022   PLT 372 08/11/2022    Attestation Statements:   Reviewed by clinician on day of visit: allergies, medications, problem list, medical history, surgical history, family history, social history, and previous encounter notes.  I, Kathlene November, BS, CMA, am acting as transcriptionist for [employer], DO.   I have reviewed the above documentation for accuracy and completeness, and I agree with the above. Marjory Sneddon, D.O.  The [place] was signed into law in 2016 which includes the topic of electronic health records.  This provides immediate access to information in MyChart.  This includes consultation notes, operative notes, office notes, lab results and pathology reports.  If you have any questions about what you read please let us know at your next visit so we can discuss your concerns and take corrective action if need be.  We are right here with you.

## 2022-11-26 ENCOUNTER — Encounter (INDEPENDENT_AMBULATORY_CARE_PROVIDER_SITE_OTHER): Payer: Self-pay | Admitting: Family Medicine

## 2022-11-26 ENCOUNTER — Ambulatory Visit (INDEPENDENT_AMBULATORY_CARE_PROVIDER_SITE_OTHER): Payer: Medicare Other | Admitting: Family Medicine

## 2022-11-26 VITALS — BP 176/87 | HR 70 | Temp 98.7°F | Ht 63.0 in

## 2022-11-26 DIAGNOSIS — E1159 Type 2 diabetes mellitus with other circulatory complications: Secondary | ICD-10-CM | POA: Diagnosis not present

## 2022-11-26 DIAGNOSIS — Z6835 Body mass index (BMI) 35.0-35.9, adult: Secondary | ICD-10-CM | POA: Diagnosis not present

## 2022-11-26 DIAGNOSIS — E559 Vitamin D deficiency, unspecified: Secondary | ICD-10-CM

## 2022-11-26 DIAGNOSIS — I152 Hypertension secondary to endocrine disorders: Secondary | ICD-10-CM | POA: Diagnosis not present

## 2022-12-05 DIAGNOSIS — I788 Other diseases of capillaries: Secondary | ICD-10-CM | POA: Diagnosis not present

## 2022-12-05 DIAGNOSIS — D1801 Hemangioma of skin and subcutaneous tissue: Secondary | ICD-10-CM | POA: Diagnosis not present

## 2022-12-05 DIAGNOSIS — L400 Psoriasis vulgaris: Secondary | ICD-10-CM | POA: Diagnosis not present

## 2022-12-05 DIAGNOSIS — L720 Epidermal cyst: Secondary | ICD-10-CM | POA: Diagnosis not present

## 2022-12-08 DIAGNOSIS — Z6835 Body mass index (BMI) 35.0-35.9, adult: Secondary | ICD-10-CM | POA: Insufficient documentation

## 2022-12-11 ENCOUNTER — Telehealth: Payer: Self-pay | Admitting: Family Medicine

## 2022-12-11 DIAGNOSIS — E1169 Type 2 diabetes mellitus with other specified complication: Secondary | ICD-10-CM

## 2022-12-11 MED ORDER — METFORMIN HCL ER 500 MG PO TB24: ORAL_TABLET | ORAL | 0 refills | Status: DC

## 2022-12-11 NOTE — Telephone Encounter (Signed)
Pt requesting refill on metformin to [place], [place] AT [place]

## 2022-12-11 NOTE — Telephone Encounter (Signed)
Done

## 2022-12-13 NOTE — Progress Notes (Unsigned)
No chief complaint on file.  Patient presents to follow-up on hypertension.  Last seen in December for hypertension. She had some wide fluctuations.  Suspected sodium intake contributed to this (and had put in notes to increase beta blocker if BP's high at home). The following day she brought in her monitor, and it was found to be accurate.  BP was higher in office than at home.  She was advised to monitor BP at home, send [place] readings in 1-2 weeks so that we can adjust meds if BP remained above goal. She presents today to discuss her high blood pressure readings. They have remained high at her [facility] clinic visits.  BP Readings from Last 3 Encounters:  11/26/22 (!) 176/87  11/05/22 (!) 179/95  10/13/22 136/80   Prior treatments reviewed--[facility] clinic changed losartan to losartan HCT in 12/2021, but Cr bumped up.  The HCTZ was stopped, Cr improved. She is currently taking losartan 131m, and metoprolol XL 229m   BP at her physical 06/19/22 was 120/70 Pulse is usually 70's-80's.  DM and obesity. [facility]. Last visit with [facility] was 1/31--note not yet available (not completed), has f/u this week.   PMH, PSH, SH reviewed   ROS: no fever, chills, chest pain, shortness of breath. Denies dizziness.  No URI symptoms.  Denies n/v/d. Headaches Edema    PHYSICAL EXAM:  There were no vitals taken for this visit.  Wt Readings from Last 3 Encounters:  11/05/22 206 lb 9.6 oz (93.7 kg)  10/13/22 208 lb 6.4 oz (94.5 kg)  10/01/22 202 lb 6.4 oz (91.8 kg)   Pleasant, well-appearing, obese female in no distress HEENT: conjunctiva and sclera are clear, EOMI. Neck: no lymphadenopathy or mass Heart: regular rate and rhythm Lungs: clear bilaterally Extremities:  Puffy feet/legs R>L Minimal pitting in feet only Psych: normal mood, affect, hygiene and grooming Neuro: alert and oriented, cranial nerves grossly intact. Normal gait  ***UPDATE  edema  ASSESSMENT/PLAN:   ???last rx for losartan was #90 in September by [facility] clinic--COMPLIANT????  Increase beta blocker if pulse allows, vs adding low dose amlodipine

## 2022-12-15 ENCOUNTER — Ambulatory Visit (INDEPENDENT_AMBULATORY_CARE_PROVIDER_SITE_OTHER): Payer: Medicare Other | Admitting: Family Medicine

## 2022-12-15 ENCOUNTER — Other Ambulatory Visit: Payer: Self-pay | Admitting: Family Medicine

## 2022-12-15 ENCOUNTER — Encounter: Payer: Self-pay | Admitting: Family Medicine

## 2022-12-15 VITALS — BP 140/74 | HR 72 | Ht 63.0 in | Wt 203.6 lb

## 2022-12-15 DIAGNOSIS — I890 Lymphedema, not elsewhere classified: Secondary | ICD-10-CM

## 2022-12-15 DIAGNOSIS — I10 Essential (primary) hypertension: Secondary | ICD-10-CM

## 2022-12-15 DIAGNOSIS — E1169 Type 2 diabetes mellitus with other specified complication: Secondary | ICD-10-CM

## 2022-12-15 DIAGNOSIS — Z6836 Body mass index (BMI) 36.0-36.9, adult: Secondary | ICD-10-CM | POA: Diagnosis not present

## 2022-12-15 MED ORDER — LOSARTAN POTASSIUM 100 MG PO TABS: 100.0000 mg | ORAL_TABLET | Freq: Every day | ORAL | 1 refills | Status: DC

## 2022-12-15 MED ORDER — METOPROLOL SUCCINATE ER 50 MG PO TB24: 50.0000 mg | ORAL_TABLET | Freq: Every day | ORAL | 1 refills | Status: DC

## 2022-12-15 NOTE — Patient Instructions (Signed)
Continue the losartan (refill was sent). We are increasing your metoprolol dose. You can double up on the 32m tablets if you want, or just switch to the 557mdose that was sent to your pharmacy today. If your pulse drops into the 40's or low 50's, or if you feel incredibly fatigued, dizzy, or other side effects, please let usKoreanow, and you likely will need to drop back to the 25 mg dose.  Follow up in 3-4 weeks, and bring your new blood pressure monitor with you.

## 2022-12-16 NOTE — Progress Notes (Unsigned)
Chief Complaint:   OBESITY April Matthews is here to discuss her progress with her obesity treatment plan along with follow-up of her obesity related diagnoses. [name] is on the Category 1 Plan and states she is following her eating plan approximately 80% of the time. [place] states she is walking/biking 60/35 minutes 6/3-4 times per week.  Today's visit was #: 55 Starting weight: 205 LBS Starting date: 05/09/2020 Today's weight: 201 LBS Today's date: 11/26/2022 Total lbs lost to date: 4 LBS  Total lbs lost since last in-office visit: 6  Interim History: [name] did excellent with her weight loss since her last office visit.  Down 5 lbs.  She has been working out regularly without issues with meal plan.  Subjective:   1. Hypertension associated with diabetes ([place]) Blood pressure last office visit 179/95.  Patient has been working closely with Dr. Ulyess Mort to change medications and better manage her blood pressure.  Asymptomatic without concerns.   Assessment/Plan:  No orders of the defined types were placed in this encounter.   Medications Discontinued During This Encounter  Medication Reason   tirzepatide [facility]) 5 MG/0.5ML Pen      No orders of the defined types were placed in this encounter.    1. Hypertension associated with diabetes ([place]) Slowly improving blood pressure, better controlled at home than here today.  Decrease salt and increase water and continue weight loss and medications per PCP.  2. BMI 35.0-35.9,adult-current bmi 35.7  3. Morbid obesity (HCC)-start bmi 36.31 April Matthews is currently in the action stage of change. As such, her goal is to continue with weight loss efforts. She has agreed to the Category 1 Plan+ breakfast and lunch options.   Exercise goals: As is.  [name] does not think she needs the [employer] and her huger/cravings are well controlled when eating on plan.  She perfers to hold off on using it for now.  Behavioral modification strategies:  planning for success.  April Matthews has agreed to follow-up with our clinic in 3 weeks. She was informed of the importance of frequent follow-up visits to maximize her success with intensive lifestyle modifications for her multiple health conditions.   Objective:   Blood pressure (!) 176/87, pulse 70, temperature 98.7 F (37.1 C), height 5' 3"$  (1.6 m), SpO2 96 %. Body mass index is 36.6 kg/m.  General: Cooperative, alert, well developed, in no acute distress. HEENT: Conjunctivae and lids unremarkable. Cardiovascular: Regular rhythm.  Lungs: Normal work of breathing. Neurologic: No focal deficits.   Lab Results  Component Value Date   CREATININE 1.31 (H) 08/18/2022   BUN 26 08/18/2022   NA 143 08/18/2022   K 4.4 08/18/2022   CL 104 08/18/2022   CO2 22 08/18/2022   Lab Results  Component Value Date   ALT 16 08/18/2022   AST 18 08/18/2022   ALKPHOS 66 08/18/2022   BILITOT 0.3 08/18/2022   Lab Results  Component Value Date   HGBA1C 6.7 (H) 08/18/2022   HGBA1C 6.6 (H) 05/21/2022   HGBA1C 6.8 (H) 01/09/2022   HGBA1C 6.2 (A) 10/02/2021   HGBA1C 6.5 (H) 06/10/2021   Lab Results  Component Value Date   INSULIN 10.2 01/09/2022   INSULIN 15.3 06/10/2021   INSULIN 14.9 01/28/2021   INSULIN 25.5 (H) 05/09/2020   Lab Results  Component Value Date   TSH 2.990 10/02/2021   Lab Results  Component Value Date   CHOL 132 06/19/2022   HDL 54 06/19/2022   LDLCALC 53 06/19/2022  TRIG 146 06/19/2022   CHOLHDL 2.4 06/19/2022   Lab Results  Component Value Date   VD25OH 44.6 08/18/2022   VD25OH 61.0 05/21/2022   VD25OH 59.8 01/09/2022   Lab Results  Component Value Date   WBC 8.5 08/11/2022   HGB 12.6 08/11/2022   HCT 37.9 08/11/2022   MCV 99 (H) 08/11/2022   PLT 372 08/11/2022   No results found for: "IRON", "TIBC", "FERRITIN"  Attestation Statements:   Reviewed by clinician on day of visit: allergies, medications, problem list, medical history, surgical history,  family history, social history, and previous encounter notes.  Time spent on visit including pre-visit chart review and post-visit care and charting was 30 minutes.   I, Brendell Tyus, am acting as Location manager for [employer], DO.   I have reviewed the above documentation for accuracy and completeness, and I agree with the above. Marjory Sneddon, D.O.  The [place] was signed into law in 2016 which includes the topic of electronic health records.  This provides immediate access to information in MyChart.  This includes consultation notes, operative notes, office notes, lab results and pathology reports.  If you have any questions about what you read please let us know at your next visit so we can discuss your concerns and take corrective action if need be.  We are right here with you.

## 2023-01-16 DIAGNOSIS — L4050 Arthropathic psoriasis, unspecified: Secondary | ICD-10-CM | POA: Diagnosis not present

## 2023-01-17 NOTE — Progress Notes (Unsigned)
No chief complaint on file.   Patient presents to follow-up on hypertension. Last month her metoprolol dose was increased to 50mg  daily, as her BP's were above goal on 25mg . She continues to take losartan 100mg  daily.  She tries to follow a low sodium diet.  The BP monitor that was verified as accurate in December broke. She got a new one ([employer]), that she verified against her BP done by the nurse at school. BROUGHT TODAY??  BP's are running  BP Readings from Last 3 Encounters:  12/15/22 (!) 140/74  11/26/22 (!) 176/87  11/05/22 (!) 179/95   DM:  Pt continues on 1000mg  of metformin daily. She had been on ozempic per [facility], didn't tolerate due to nausea, and elected not to switch to [facility] to try harder on her own for weight loss. Denies any numbness or tingling.   Last diabetic eye exam was 01/2021 (her appt was cancelled by ophtho this summer, hasn't rescheduled yet) She checks feet regularly, no concerns. Denies polydipsia, polyuria, hypoglycemia   Lab Results  Component Value Date   HGBA1C 6.7 (H) 08/18/2022   Obesity:  Under the care of [facility] clinic.  Last visit was 11/26/22, and she has no f/u visits scheduled.   PMH, PSH, SH reviewed    ROS: no fever, chills, chest pain, shortness of breath. Denies dizziness.  No URI symptoms.  Denies n/v/d. Edema unchanged.  No skin issues or pain in LE's. Some snoring. No unrefreshed sleep or daytime somnolence.    PHYSICAL EXAM:  There were no vitals taken for this visit.  Wt Readings from Last 3 Encounters:  12/15/22 203 lb 9.6 oz (92.4 kg)  11/05/22 206 lb 9.6 oz (93.7 kg)  10/13/22 208 lb 6.4 oz (94.5 kg)   Pleasant, well-appearing, obese female in no distress HEENT: conjunctiva and sclera are clear, EOMI. Neck: no lymphadenopathy or mass, no bruit Heart: regular rate and rhythm Lungs: clear bilaterally Extremities:  Puffy feet/legs R>L, no pitting edema. Skin intact. Psych: normal mood, affect, hygiene  and grooming Neuro: alert and oriented, cranial nerves grossly intact. Normal gait  UPDATE LEGS***   ASSESSMENT/PLAN:  A1c PT WAS TO BRING IN HER NEW OMRON BP CUFF TO VERIFY ACCURACY DID SHE HAVE OR SCHEDULE HER DIABETIC EYE EXAM?  ?rf metop if not changing  F/u as scheduled for wellness visit in August 1:30 pm Desires fasting labs prior?  If so, needs orders Unsure if [facility] will do labs before then, but will need urine microalb (and c-met, lipids, etc)

## 2023-01-19 ENCOUNTER — Ambulatory Visit (INDEPENDENT_AMBULATORY_CARE_PROVIDER_SITE_OTHER): Payer: Medicare Other | Admitting: Family Medicine

## 2023-01-19 ENCOUNTER — Encounter: Payer: Self-pay | Admitting: Family Medicine

## 2023-01-19 ENCOUNTER — Other Ambulatory Visit: Payer: Self-pay | Admitting: Family Medicine

## 2023-01-19 VITALS — BP 150/84 | HR 68 | Ht 63.0 in | Wt 206.0 lb

## 2023-01-19 DIAGNOSIS — E1169 Type 2 diabetes mellitus with other specified complication: Secondary | ICD-10-CM | POA: Diagnosis not present

## 2023-01-19 DIAGNOSIS — I152 Hypertension secondary to endocrine disorders: Secondary | ICD-10-CM

## 2023-01-19 DIAGNOSIS — E785 Hyperlipidemia, unspecified: Secondary | ICD-10-CM | POA: Diagnosis not present

## 2023-01-19 DIAGNOSIS — E1159 Type 2 diabetes mellitus with other circulatory complications: Secondary | ICD-10-CM | POA: Diagnosis not present

## 2023-01-19 LAB — POCT GLYCOSYLATED HEMOGLOBIN (HGB A1C): Hemoglobin A1C: 7 % — AB (ref 4.0–5.6)

## 2023-01-19 MED ORDER — VALSARTAN 320 MG PO TABS: 320.0000 mg | ORAL_TABLET | Freq: Every day | ORAL | 1 refills | Status: DC

## 2023-01-19 NOTE — Patient Instructions (Signed)
Your A1c was up to 7% today. I think it is a good idea to start the [facility] that was previously recommended and prescribed by the weight loss clinic.  Contact them to get this prescribed, if there isn't a prescription already on file at your pharmacy.

## 2023-02-16 ENCOUNTER — Encounter: Payer: Medicare Other | Admitting: Family Medicine

## 2023-03-13 ENCOUNTER — Other Ambulatory Visit: Payer: Self-pay | Admitting: Family Medicine

## 2023-03-13 DIAGNOSIS — E1169 Type 2 diabetes mellitus with other specified complication: Secondary | ICD-10-CM

## 2023-03-13 DIAGNOSIS — L4050 Arthropathic psoriasis, unspecified: Secondary | ICD-10-CM | POA: Diagnosis not present

## 2023-03-31 ENCOUNTER — Other Ambulatory Visit: Payer: Medicare Other

## 2023-03-31 ENCOUNTER — Telehealth: Payer: Self-pay

## 2023-03-31 NOTE — Telephone Encounter (Signed)
Patient came by the office today for a nurse blood pressure check. She brought in her home blood pressure monitor. The reading on her monitor was  173/89 (right arm).   I checked her blood pressure manually and the reading as 162/70 (right arm). Patient has an appointment with Dr. Lynelle Doctor tomorrow and plans to bring in her blood pressure monitor again to compare readings.

## 2023-03-31 NOTE — Progress Notes (Unsigned)
No chief complaint on file.  Patient presents to discuss her elevated blood pressures.  In February her metoprolol dose was increased to 50mg  daily, as her BP's were above goal on 25mg , in addition to losartan 100mg  daily.  BP remained above goal at her visit in March.  Losartan was changed to Valsartan 320mg , and she was supposed to f/u in 4 weeks (with her new BP monitor, and for b-met), with plan to add low dose amlodipine if remained above goal. She cancelled her April visit.  She came in yesterday for a nurse visit with her new blood pressure  monitor.   Home monitor BP was 173/89 RA. Nurse BP was 162/70 on RA.  BP's at home are running   She denies headaches, dizziness, chest pain, shortness of breath. She has chronic swelling/lymphedema.  She wears compression stockings daily.  DM It was also noted that her A1c was up to 7.0% at her visit 2 months ago.  She was encouraged to f/u with [facility], and start mounjaro as they had recommended. She is currently taking metformin 1000mg  daily.  She has not been back to [facility], no appts scheduled.   PMH, PSH, SH reviewed   ROS:  No fever, chills, headaches, dizziness, chest pain, shortness of breath. No URI symptoms, muscle cramps. No bleeding, bruising, rash. Denies n/v/d. Edema unchanged.  Denies rashes.  Moods are good.   PHYSICAL EXAM:  There were no vitals taken for this visit.  Wt Readings from Last 3 Encounters:  01/19/23 206 lb (93.4 kg)  12/15/22 203 lb 9.6 oz (92.4 kg)  11/05/22 206 lb 9.6 oz (93.7 kg)    Pleasant, well-appearing, obese female in no distress HEENT: conjunctiva and sclera are clear, EOMI. Neck: no lymphadenopathy or mass, no bruit Heart: regular rate and rhythm Lungs: clear bilaterally Extremities:  Puffy feet/legs bilaterally, no pitting edema. Psych: normal mood, affect, hygiene and grooming Neuro: alert and oriented, cranial nerves grossly intact. Normal gait      ASSESSMENT/PLAN:  Confirm if taking 50mg  of metoprolol or just 25 mg (she had just gotten 90d rx of 25mg , so didn't want new rx--see what she is actually taking)  Does she plan to f/u with [facility]?? Last A1c above goal--if not planning to start mounjaro, then need to increase metformin dose, vs recheck A1c at f/u next month when she comes for her BP f/u.

## 2023-04-01 ENCOUNTER — Other Ambulatory Visit: Payer: Self-pay | Admitting: Family Medicine

## 2023-04-01 ENCOUNTER — Encounter: Payer: Self-pay | Admitting: Family Medicine

## 2023-04-01 ENCOUNTER — Ambulatory Visit (INDEPENDENT_AMBULATORY_CARE_PROVIDER_SITE_OTHER): Payer: Medicare Other | Admitting: Family Medicine

## 2023-04-01 VITALS — BP 175/94 | HR 80 | Ht 63.0 in | Wt 210.2 lb

## 2023-04-01 DIAGNOSIS — I152 Hypertension secondary to endocrine disorders: Secondary | ICD-10-CM

## 2023-04-01 DIAGNOSIS — E559 Vitamin D deficiency, unspecified: Secondary | ICD-10-CM

## 2023-04-01 DIAGNOSIS — I10 Essential (primary) hypertension: Secondary | ICD-10-CM

## 2023-04-01 DIAGNOSIS — E1159 Type 2 diabetes mellitus with other circulatory complications: Secondary | ICD-10-CM | POA: Diagnosis not present

## 2023-04-01 DIAGNOSIS — Z5181 Encounter for therapeutic drug level monitoring: Secondary | ICD-10-CM | POA: Diagnosis not present

## 2023-04-01 LAB — BASIC METABOLIC PANEL
BUN/Creatinine Ratio: 21 (ref 12–28)
BUN: 26 mg/dL (ref 8–27)
CO2: 23 mmol/L (ref 20–29)
Calcium: 10.1 mg/dL (ref 8.7–10.3)
Chloride: 104 mmol/L (ref 96–106)
Creatinine, Ser: 1.26 mg/dL — ABNORMAL HIGH (ref 0.57–1.00)
Glucose: 210 mg/dL — ABNORMAL HIGH (ref 70–99)
Potassium: 5 mmol/L (ref 3.5–5.2)
Sodium: 142 mmol/L (ref 134–144)
eGFR: 44 mL/min/{1.73_m2} — ABNORMAL LOW (ref >59)

## 2023-04-01 MED ORDER — BLOOD GLUCOSE MONITORING SUPPL DEVI: 1.0000 | Freq: Every day | 0 refills | Status: AC

## 2023-04-01 MED ORDER — VITAMIN D (ERGOCALCIFEROL) 1.25 MG (50000 UNIT) PO CAPS: 50000.0000 [IU] | ORAL_CAPSULE | ORAL | 0 refills | Status: DC

## 2023-04-01 MED ORDER — BLOOD GLUCOSE TEST VI STRP: ORAL_STRIP | 5 refills | Status: AC

## 2023-04-01 MED ORDER — LANCETS MISC. MISC: 5 refills | Status: AC

## 2023-04-01 MED ORDER — DILTIAZEM HCL ER BEADS 120 MG PO CP24
120.0000 mg | ORAL_CAPSULE | Freq: Every day | ORAL | 1 refills | Status: DC
Start: 2023-04-01 — End: 2023-04-22

## 2023-04-01 MED ORDER — LANCET DEVICE MISC: 0 refills | Status: AC

## 2023-04-01 MED ORDER — VALSARTAN 320 MG PO TABS: 320.0000 mg | ORAL_TABLET | Freq: Every day | ORAL | 1 refills | Status: DC

## 2023-04-01 NOTE — Patient Instructions (Signed)
  Please check your sugars regularly, and make adjustments to your diet if they are running high. We will check A1c at your next visit.  You should NOT be taking both losartan and valsartan. These are the same class of medication and shouldn't be used together. The valsartan is usually more effective, which is why I made the switch at your last visit. Your pulse is high due to being out of the metoprolol (which controls pulse).  Continue the Valsartan. STOP the losartan. Start diltiazem once daily. This should help with blood pressure and lower the pulse. Hopefully it won't lower the pulse below 60 on a regular basis, or lower your blood pressure too much. Continue to monitor these.  Bring your list of blood pressures to your next visit. Your monitor is accurate. Contact us if you are having problems.

## 2023-04-16 DIAGNOSIS — D3132 Benign neoplasm of left choroid: Secondary | ICD-10-CM | POA: Diagnosis not present

## 2023-04-16 DIAGNOSIS — H2513 Age-related nuclear cataract, bilateral: Secondary | ICD-10-CM | POA: Diagnosis not present

## 2023-04-16 DIAGNOSIS — H5203 Hypermetropia, bilateral: Secondary | ICD-10-CM | POA: Diagnosis not present

## 2023-04-16 LAB — HM DIABETES EYE EXAM

## 2023-04-21 NOTE — Progress Notes (Unsigned)
No chief complaint on file.  Patient presents for follow up on her hypertension. At her last visit she was inadvertantly taking both valsartan and losartan, and had stopped her metoprolol. The metoprolol and losartan was stopped, diltiazem was started, and she was to continue on valsartan.  She reports tolerating the diltiazem without side effects.  Her blood pressure is running  Monitor has been verified as accurate.  Pulse is running  Palpitations??    She denies headaches,chest pain, shortness of breath.  She has chronic swelling/lymphedema.  She wears compression stockings daily in the cooler temps, none in the last few weeks. Doesn't wear them when wearing dresses.  She wears low compression socks every night, and swelling in feet is improved by morning.     DM-- It was also noted that her A1c was up to 7.0% in March 2023.  She was encouraged to f/u with [facility] clinic, and start mounjaro as they had recommended.  She didn't really want to do this now, was too busy. She is currently taking metformin 1000mg  daily.   At her recent visit, she hadn't been checking sugars. She was encouraged to check sugar regularly.   PMH, PSH, SH reviewed    ROS:   PHYSICAL EXAM:  There were no vitals taken for this visit.  Wt Readings from Last 3 Encounters:  04/01/23 210 lb 3.2 oz (95.3 kg)  01/19/23 206 lb (93.4 kg)  12/15/22 203 lb 9.6 oz (92.4 kg)   Pleasant, well-appearing, obese female in no distress HEENT: conjunctiva and sclera are clear, EOMI. Neck: no lymphadenopathy or mass, no bruit Heart: regular rate and rhythm Lungs: clear bilaterally Back: no CVA or spinal tenderness Abdomen: soft, obese, nontender. No bruit, no mass. Extremities:  Puffy feet/legs bilaterally, no pitting edema. Slightly tender when pressing. Some hyperpigmentation/hemosiderin deposits anteriorly, small amount Psych: normal mood, affect, hygiene and grooming Neuro: alert and oriented, cranial nerves  grossly intact. Normal gait  UPDATE EXAM ***   ASSESSMENT/PLAN:  I CAN'T TELL HOW YOU ORDERED A1C--ENSURE IT IS POC.  IT DOESN"T LOOK LIKE IT  Should be bringing her list of BP and sugars to this visit.

## 2023-04-22 ENCOUNTER — Ambulatory Visit (INDEPENDENT_AMBULATORY_CARE_PROVIDER_SITE_OTHER): Payer: Medicare Other | Admitting: Family Medicine

## 2023-04-22 ENCOUNTER — Encounter: Payer: Self-pay | Admitting: Family Medicine

## 2023-04-22 VITALS — BP 128/70 | HR 76 | Ht 63.0 in | Wt 207.4 lb

## 2023-04-22 DIAGNOSIS — R200 Anesthesia of skin: Secondary | ICD-10-CM | POA: Diagnosis not present

## 2023-04-22 DIAGNOSIS — Z6836 Body mass index (BMI) 36.0-36.9, adult: Secondary | ICD-10-CM

## 2023-04-22 DIAGNOSIS — E1169 Type 2 diabetes mellitus with other specified complication: Secondary | ICD-10-CM | POA: Diagnosis not present

## 2023-04-22 DIAGNOSIS — E1159 Type 2 diabetes mellitus with other circulatory complications: Secondary | ICD-10-CM | POA: Diagnosis not present

## 2023-04-22 DIAGNOSIS — I152 Hypertension secondary to endocrine disorders: Secondary | ICD-10-CM | POA: Diagnosis not present

## 2023-04-22 DIAGNOSIS — R202 Paresthesia of skin: Secondary | ICD-10-CM | POA: Diagnosis not present

## 2023-04-22 DIAGNOSIS — I10 Essential (primary) hypertension: Secondary | ICD-10-CM | POA: Diagnosis not present

## 2023-04-22 LAB — POCT GLYCOSYLATED HEMOGLOBIN (HGB A1C): Hemoglobin A1C: 6.8 % — AB (ref 4.0–5.6)

## 2023-04-22 MED ORDER — IPRATROPIUM BROMIDE 0.03 % NA SOLN: 2.0000 | Freq: Two times a day (BID) | NASAL | 0 refills | Status: DC

## 2023-04-22 MED ORDER — DILTIAZEM HCL ER BEADS 180 MG PO CP24
180.0000 mg | ORAL_CAPSULE | Freq: Every day | ORAL | 3 refills | Status: DC
Start: 2023-04-22 — End: 2023-08-27

## 2023-04-22 MED ORDER — VALSARTAN 320 MG PO TABS: 320.0000 mg | ORAL_TABLET | Freq: Every day | ORAL | 1 refills | Status: DC

## 2023-04-22 NOTE — Patient Instructions (Addendum)
We discussed the possible causes of your tingling in your fingertips. You can try wearing a carpal tunnel brace to see if this helps. If you develop any pain, weakness, or more widespread numbness/tingling, let us know. We briefly discussed nerve conduction studies to further evaluate the cause, in order to determine treatment (which likely isn't going to be needed until it is getting worse).  We discussed starting Mounjaro to help with blood sugars and weight loss (as previously recommended by the weight loss clinic). I would be happy to start the lowest dose, but feel that regular follow-up with the clinic would be the best as far as helping with the weight loss (closer follow-up on your diet, and side effects, and titrating the dose up, if needed). Let me know if you are interested in starting.  Feel free to research it more. We reviewed the side effects today.  Pay attention to your sugars--if your fasting sugar is >135, or if your 2 hours after a meal is >160-180, be thinking about WHAT you ate, portions. Jot notes, so that you can determine patterns, and see what changes you need to make, to avoid seeing those high sugars moving forward. Check fasting sugars every other day, and after meals between those. If your fasting sugar is very high, sometimes that can be because your bedtime sugar was very high (think about what you ate for dinner the night before).  Continue to limit the portions of carbs, eat whole grains.  You should try and eat fiber first, then protein, and save the carbs for the end of the meal.  We increased the dose of diltiazem to help with your pulse with activity, and with your home blood pressure readings.  Your reading in the office was very good. You likely will see your resting heart rate drop. As long as it is over 50 and you aren't feeling poorly, it is fine. Let us know if you start having issues with feeling dizzy, more lethargic, etc.  Then we may need to go back  to the lower dose (especially if your BP is also on the low side).

## 2023-04-24 DIAGNOSIS — M1991 Primary osteoarthritis, unspecified site: Secondary | ICD-10-CM | POA: Diagnosis not present

## 2023-04-24 DIAGNOSIS — L4050 Arthropathic psoriasis, unspecified: Secondary | ICD-10-CM | POA: Diagnosis not present

## 2023-04-24 DIAGNOSIS — M79605 Pain in left leg: Secondary | ICD-10-CM | POA: Diagnosis not present

## 2023-04-24 DIAGNOSIS — E559 Vitamin D deficiency, unspecified: Secondary | ICD-10-CM | POA: Diagnosis not present

## 2023-04-24 DIAGNOSIS — L401 Generalized pustular psoriasis: Secondary | ICD-10-CM | POA: Diagnosis not present

## 2023-04-24 DIAGNOSIS — G5601 Carpal tunnel syndrome, right upper limb: Secondary | ICD-10-CM | POA: Diagnosis not present

## 2023-04-24 DIAGNOSIS — Z79899 Other long term (current) drug therapy: Secondary | ICD-10-CM | POA: Diagnosis not present

## 2023-04-24 DIAGNOSIS — R7989 Other specified abnormal findings of blood chemistry: Secondary | ICD-10-CM | POA: Diagnosis not present

## 2023-04-24 DIAGNOSIS — E669 Obesity, unspecified: Secondary | ICD-10-CM | POA: Diagnosis not present

## 2023-04-24 DIAGNOSIS — Z6836 Body mass index (BMI) 36.0-36.9, adult: Secondary | ICD-10-CM | POA: Diagnosis not present

## 2023-05-07 DIAGNOSIS — H2513 Age-related nuclear cataract, bilateral: Secondary | ICD-10-CM | POA: Diagnosis not present

## 2023-05-07 DIAGNOSIS — H25041 Posterior subcapsular polar age-related cataract, right eye: Secondary | ICD-10-CM | POA: Diagnosis not present

## 2023-05-08 DIAGNOSIS — L4050 Arthropathic psoriasis, unspecified: Secondary | ICD-10-CM | POA: Diagnosis not present

## 2023-05-08 DIAGNOSIS — Z79899 Other long term (current) drug therapy: Secondary | ICD-10-CM | POA: Diagnosis not present

## 2023-05-23 NOTE — Progress Notes (Unsigned)
No chief complaint on file.  Patient presents for follow-up on hypertension.  Seen last month, after switching from metoprolol to diltiazem 120mg , and continuing on valsartan (had been switched from losartan, though at one point was taking both ARB's in error). She was concerned that her pulse increased with activity, sometimes with very little activity, and she was limiting her activity due to this. BP's had been above goal, pulse was mostly 70, with resting pulse of 65, whereas it used to be 61, per her Fitbit.  Her diltiazem dose was increased from 120mg  to 180mg .  Her BP's have been running Pulse     She had mentioned tingling in R first through third fingertips. Exam was unrevealing. We had discussed Ddx, and trial of CTS wrist braces.  Diabetes: A1c was 6.8% at last visit. She was encouraged to reconsider Mounjaro (as recommended by [facility] clinic, to help with weight loss).    PMH, PSH, SH reviewed   ROS: no fever, chills, URI symptoms, headaches, dizziness, chest pain, shortness of breath. No GI complaints, constipation. No edema. Moods are good.  See HPI.     PHYSICAL EXAM:  There were no vitals taken for this visit.  Wt Readings from Last 3 Encounters:  04/22/23 207 lb 6.4 oz (94.1 kg)  04/01/23 210 lb 3.2 oz (95.3 kg)  01/19/23 206 lb (93.4 kg)   Pleasant, well-appearing, obese female in no distress HEENT: conjunctiva and sclera are clear, EOMI. Neck: no lymphadenopathy or mass Heart: regular rate and rhythm Lungs: clear bilaterally Back: no CVA or spinal tenderness Abdomen: soft, obese, nontender. Extremities:  Puffy feet/legs bilaterally, no pitting edema. Not wearing compression socks today (and is wearing pants).Trace edema.  Very sensitive to pressure on the pretibial/ankle area. ***UPDATE  Psych: normal mood, affect, hygiene and grooming Neuro: alert and oriented, cranial nerves grossly intact. Normal gait     ASSESSMENT/PLAN:

## 2023-05-25 ENCOUNTER — Ambulatory Visit (INDEPENDENT_AMBULATORY_CARE_PROVIDER_SITE_OTHER): Payer: Medicare Other | Admitting: Family Medicine

## 2023-05-25 ENCOUNTER — Encounter: Payer: Self-pay | Admitting: Family Medicine

## 2023-05-25 VITALS — BP 130/74 | HR 76 | Ht 63.0 in | Wt 209.4 lb

## 2023-05-25 DIAGNOSIS — I10 Essential (primary) hypertension: Secondary | ICD-10-CM

## 2023-05-25 DIAGNOSIS — E1169 Type 2 diabetes mellitus with other specified complication: Secondary | ICD-10-CM

## 2023-05-25 DIAGNOSIS — Z6837 Body mass index (BMI) 37.0-37.9, adult: Secondary | ICD-10-CM

## 2023-05-25 DIAGNOSIS — I152 Hypertension secondary to endocrine disorders: Secondary | ICD-10-CM | POA: Diagnosis not present

## 2023-05-25 DIAGNOSIS — E1159 Type 2 diabetes mellitus with other circulatory complications: Secondary | ICD-10-CM

## 2023-05-25 MED ORDER — TIRZEPATIDE 2.5 MG/0.5ML ~~LOC~~ SOAJ
2.5000 mg | SUBCUTANEOUS | 1 refills | Status: DC
Start: 2023-05-25 — End: 2024-03-30

## 2023-05-25 NOTE — Patient Instructions (Addendum)
You elected to start back on the [facility]. We sent the starting dose to the pharmacy (with a refill for the 2nd month). I'd really like for you to follow up with the [facility] to help you during this process.  Continue to eat a high protein diet, limiting your carbs. Continue to try and eat a high fiber diet, with lots of vegetables, and some fruit. Continue to try and get some exercise. Plan ahead for the upcoming changes to your schedule, when school starts back (as far as cooking, meal prep and exercise). Try and avoid processed foods.  Please look at your calendar and call back to reschedule your physical.

## 2023-05-25 NOTE — Assessment & Plan Note (Signed)
Improved control since diltiazem dose increased to 180mg . Continue, along with losartan.  BP has been above goal at home, but has been stressed. Normal in office today. Cont regular monitory, low Na diet. Daily exercise and weight loss encouraged.

## 2023-05-25 NOTE — Assessment & Plan Note (Signed)
Continue metformin.  Add Mounjaro 2.5 mg weekly. To f/u with [facility] clinic for any titration up for weight loss. Encouraged proper diet, exercise, weight loss.  Reviewed healthy snacks (fiber/protein), to have "sweet" things like [place] more as a dessert than mid-afternoon snack.

## 2023-05-25 NOTE — Assessment & Plan Note (Signed)
Unsuccessful in losing weight with diet/exercise on her own.  Has not been back to [facility] clinic. Strongly encouraged that she return to them for close monitoring and guidance related to [facility], and diet.  She wants to start [facility] now (to help with diabetes and weight loss), due to being off school/work.  Will start at 2.5 mg dose, but she is to schedule f/u with [facility] for f/u, hopefully within the month. Risks/SE and ways to reduce SE were reviewed.

## 2023-06-02 DIAGNOSIS — H25811 Combined forms of age-related cataract, right eye: Secondary | ICD-10-CM | POA: Diagnosis not present

## 2023-06-02 DIAGNOSIS — H2511 Age-related nuclear cataract, right eye: Secondary | ICD-10-CM | POA: Diagnosis not present

## 2023-06-02 DIAGNOSIS — H25041 Posterior subcapsular polar age-related cataract, right eye: Secondary | ICD-10-CM | POA: Diagnosis not present

## 2023-06-02 DIAGNOSIS — Z961 Presence of intraocular lens: Secondary | ICD-10-CM | POA: Diagnosis not present

## 2023-06-02 DIAGNOSIS — H52201 Unspecified astigmatism, right eye: Secondary | ICD-10-CM | POA: Diagnosis not present

## 2023-06-10 ENCOUNTER — Other Ambulatory Visit: Payer: Self-pay | Admitting: Family Medicine

## 2023-06-10 DIAGNOSIS — E1169 Type 2 diabetes mellitus with other specified complication: Secondary | ICD-10-CM

## 2023-06-11 ENCOUNTER — Encounter: Payer: Self-pay | Admitting: Internal Medicine

## 2023-06-11 DIAGNOSIS — E1169 Type 2 diabetes mellitus with other specified complication: Secondary | ICD-10-CM

## 2023-06-11 NOTE — Telephone Encounter (Signed)
Left message for pt to call me back 

## 2023-06-12 MED ORDER — METFORMIN HCL ER 500 MG PO TB24
ORAL_TABLET | ORAL | 0 refills | Status: DC
Start: 2023-06-12 — End: 2023-11-26

## 2023-06-15 ENCOUNTER — Other Ambulatory Visit: Payer: Self-pay | Admitting: Family Medicine

## 2023-06-15 DIAGNOSIS — E7800 Pure hypercholesterolemia, unspecified: Secondary | ICD-10-CM

## 2023-06-22 ENCOUNTER — Encounter: Payer: Medicare Other | Admitting: Family Medicine

## 2023-06-23 DIAGNOSIS — H52202 Unspecified astigmatism, left eye: Secondary | ICD-10-CM | POA: Diagnosis not present

## 2023-06-23 DIAGNOSIS — H2512 Age-related nuclear cataract, left eye: Secondary | ICD-10-CM | POA: Diagnosis not present

## 2023-06-23 DIAGNOSIS — H25811 Combined forms of age-related cataract, right eye: Secondary | ICD-10-CM | POA: Diagnosis not present

## 2023-06-23 DIAGNOSIS — Z961 Presence of intraocular lens: Secondary | ICD-10-CM | POA: Diagnosis not present

## 2023-06-28 HISTORY — PX: CATARACT EXTRACTION: SUR2

## 2023-07-10 DIAGNOSIS — L4050 Arthropathic psoriasis, unspecified: Secondary | ICD-10-CM | POA: Diagnosis not present

## 2023-07-15 ENCOUNTER — Other Ambulatory Visit: Payer: Self-pay | Admitting: Family Medicine

## 2023-07-15 DIAGNOSIS — E559 Vitamin D deficiency, unspecified: Secondary | ICD-10-CM

## 2023-07-15 NOTE — Telephone Encounter (Signed)
Left message asking patient to call me back

## 2023-07-15 NOTE — Telephone Encounter (Signed)
Is this okay to fill until appt in Jan?

## 2023-07-15 NOTE — Telephone Encounter (Signed)
She was supposed to f/u in 2-3 mos from her late July appt. She is scheduled for late January--that is SIX months. Any way she can find a date that is sooner?? She was just restarted on Mounjaro, and should be seen sooner than a 6 month f/u.  Ok to refill #6, which is 3 months, but I want her seen sooner (at least for a med check, if not for her wellness visit.)

## 2023-07-16 DIAGNOSIS — Z23 Encounter for immunization: Secondary | ICD-10-CM | POA: Diagnosis not present

## 2023-07-17 ENCOUNTER — Encounter: Payer: Self-pay | Admitting: Podiatry

## 2023-07-17 ENCOUNTER — Ambulatory Visit (INDEPENDENT_AMBULATORY_CARE_PROVIDER_SITE_OTHER): Payer: Medicare Other

## 2023-07-17 ENCOUNTER — Ambulatory Visit (INDEPENDENT_AMBULATORY_CARE_PROVIDER_SITE_OTHER): Payer: Medicare Other | Admitting: Podiatry

## 2023-07-17 DIAGNOSIS — R600 Localized edema: Secondary | ICD-10-CM

## 2023-07-17 DIAGNOSIS — M778 Other enthesopathies, not elsewhere classified: Secondary | ICD-10-CM

## 2023-07-17 DIAGNOSIS — M779 Enthesopathy, unspecified: Secondary | ICD-10-CM

## 2023-07-17 MED ORDER — TRIAMCINOLONE ACETONIDE 10 MG/ML IJ SUSP
10.0000 mg | Freq: Once | INTRAMUSCULAR | Status: AC
Start: 2023-07-17 — End: 2023-07-17
  Administered 2023-07-17: 10 mg via INTRA_ARTICULAR

## 2023-07-20 ENCOUNTER — Other Ambulatory Visit (INDEPENDENT_AMBULATORY_CARE_PROVIDER_SITE_OTHER): Payer: Self-pay | Admitting: Adult Health

## 2023-07-20 DIAGNOSIS — E559 Vitamin D deficiency, unspecified: Secondary | ICD-10-CM

## 2023-07-20 NOTE — Progress Notes (Signed)
Subjective:   Patient ID: April Matthews, female   DOB: 77 y.o.   MRN: [id number]   HPI Patient presents with pain in her right foot and ankle and states she does not remember specific injury but states it has been hard to walk on with any degree of comfort.  States it has been very sore and that swelling has occurred in her right lower leg and ankle and she has had a history of this on the left also but it has gotten worse in the ankle.  Patient does not smoke tries to be active   Review of Systems  All other systems reviewed and are negative.       Objective:  Physical Exam Vitals and nursing note reviewed.  Constitutional:      Appearance: She is well-developed.  Pulmonary:     Effort: Pulmonary effort is normal.  Musculoskeletal:        General: Normal range of motion.  Skin:    General: Skin is warm.  Neurological:     Mental Status: She is alert.     Neurovascular status intact muscle strength was found to be adequate range of motion adequate with patient noted to have edema in the right ankle and lower leg negative Denna Haggard' sign was noted with the patient found to have inflammation around the posterior tibial tendon right with no indication of muscle dysfunction     Assessment:  Acute posterior tibial tendinitis with swelling which does not appear to be related to any other issue except for the inflammation around the ankle      Plan:  H&P reviewed and at this point I did explain to her that of any swelling in her legs should get worse any shortness of breath or any other pathology she is straight to emergency room.  At this point appears to be localized and were treating it that way and I did do sterile prep I injected the tendon complex right 3 mg Kenalog 5 mg Xylocaine posterior tibial and then applied a ankle compression stocking to the lower ankle area.  Tolerated well reappoint as symptoms indicate  X-rays were negative for signs of bony pathology or collapse medial  longitudinal arch

## 2023-07-21 ENCOUNTER — Other Ambulatory Visit (INDEPENDENT_AMBULATORY_CARE_PROVIDER_SITE_OTHER): Payer: Self-pay | Admitting: Family Medicine

## 2023-07-21 ENCOUNTER — Other Ambulatory Visit: Payer: Self-pay | Admitting: Family Medicine

## 2023-07-21 DIAGNOSIS — E559 Vitamin D deficiency, unspecified: Secondary | ICD-10-CM

## 2023-07-22 ENCOUNTER — Encounter: Payer: Self-pay | Admitting: *Deleted

## 2023-07-31 ENCOUNTER — Ambulatory Visit: Payer: Medicare Other | Admitting: Podiatry

## 2023-08-07 ENCOUNTER — Ambulatory Visit: Payer: Medicare Other | Admitting: Podiatry

## 2023-08-07 ENCOUNTER — Encounter: Payer: Self-pay | Admitting: Podiatry

## 2023-08-07 DIAGNOSIS — M779 Enthesopathy, unspecified: Secondary | ICD-10-CM | POA: Diagnosis not present

## 2023-08-07 DIAGNOSIS — R600 Localized edema: Secondary | ICD-10-CM | POA: Diagnosis not present

## 2023-08-10 NOTE — Progress Notes (Signed)
Subjective:   Patient ID: April Matthews, female   DOB: 77 y.o.   MRN: [id number]   HPI Patient states the pain has improved quite significantly with still mild discomfort only with excessive activity and swelling which is reduced   ROS      Objective:  Physical Exam  Neurovascular status intact diminished discomfort around the posterior tibial tendon right fluid buildup noted but better with swelling around the ankle noted with negative Denna Haggard' sign     Assessment:  Chronic swelling ankle secondary to venous disease with inflammation of the posterior tibial tendon right that is improving     Plan:  H&P done reviewed exercises to lift the arch up and keep it supported and continue ankle compression stocking to keep swelling down.  Reappoint as symptoms indicate may require MRI if symptoms come back quickly

## 2023-08-24 ENCOUNTER — Encounter: Payer: Self-pay | Admitting: *Deleted

## 2023-08-24 NOTE — Progress Notes (Unsigned)
No chief complaint on file.  Patient presents for 3 month follow-up on chronic problems.   Diabetes: She was started on Mounjaro at last visit, 2.5 mg weekly, in addition to her Metformin, to help with weight loss. She was to f/u with [facility]. She never scheduled a f/u appointment with them.   She states Ozempic didn't work--"didn't change anything"--no decrease in hunger, no weight loss.    Lab Results  Component Value Date   HGBA1C 6.8 (A) 04/22/2023    Hypertension:  She is compliant with diltiazem 180 mg and valsartan 320 mg. She denies side effects. BP's are running   She denies headaches, dizziness, chest pain, palpitations, shortness of breath. Denies edema.     PMH, PSH, SH reviewed   ROS: no fever, chills, URI symptoms, headaches, dizziness, chest pain, shortness of breath. No GI complaints, constipation. No edema.    PHYSICAL EXAM:  There were no vitals taken for this visit.  Wt Readings from Last 3 Encounters:  05/25/23 209 lb 6.4 oz (95 kg)  04/22/23 207 lb 6.4 oz (94.1 kg)  04/01/23 210 lb 3.2 oz (95.3 kg)   Pleasant, well-appearing, obese female in no distress HEENT: conjunctiva and sclera are clear, EOMI. Neck: no lymphadenopathy or mass Heart: regular rate and rhythm Lungs: clear bilaterally Back: no CVA or spinal tenderness Abdomen: soft, obese, nontender. Psych: normal mood, affect, hygiene and grooming. Became tearful in discussing the fairly sudden loss of the Monsignor Neuro: alert and oriented, cranial nerves grossly intact. Normal gait   ASSESSMENT/PLAN:  Is she taking mounjaro? Only rx'd 2 mos at last visit, bc she was to f/u with [facility] and further titrate and get from them. She never saw them. Guessing she ran out.  A1c  Flu, COVID Prevnar-20 if declines COVID Also past due for TdaP from pharmacy, and shingrix--did she ever get these?  F/u as scheduled for AWV in late January 2025

## 2023-08-26 ENCOUNTER — Encounter: Payer: Self-pay | Admitting: Family Medicine

## 2023-08-26 ENCOUNTER — Ambulatory Visit: Payer: Medicare Other | Admitting: Family Medicine

## 2023-08-26 VITALS — BP 120/70 | HR 64 | Ht 63.0 in | Wt 211.6 lb

## 2023-08-26 DIAGNOSIS — Z7185 Encounter for immunization safety counseling: Secondary | ICD-10-CM | POA: Diagnosis not present

## 2023-08-26 DIAGNOSIS — E1159 Type 2 diabetes mellitus with other circulatory complications: Secondary | ICD-10-CM | POA: Diagnosis not present

## 2023-08-26 DIAGNOSIS — E1169 Type 2 diabetes mellitus with other specified complication: Secondary | ICD-10-CM

## 2023-08-26 DIAGNOSIS — I152 Hypertension secondary to endocrine disorders: Secondary | ICD-10-CM

## 2023-08-26 LAB — POCT GLYCOSYLATED HEMOGLOBIN (HGB A1C): Hemoglobin A1C: 7.1 % — AB (ref 4.0–5.6)

## 2023-08-26 NOTE — Patient Instructions (Signed)
You elected to work harder for the next 3 months. We discussed setting SMART goals for diet and exercise, such as increased protein intake and using the exercise bike (at least 15 minutes 3-4 days/week).  Try and check sugars more often (this holds your diet more accountable).  We will recheck the A1c at your physical in 3 months. Good luck!

## 2023-08-27 ENCOUNTER — Encounter: Payer: Self-pay | Admitting: Family Medicine

## 2023-08-27 ENCOUNTER — Other Ambulatory Visit: Payer: Self-pay | Admitting: Family Medicine

## 2023-08-27 DIAGNOSIS — I10 Essential (primary) hypertension: Secondary | ICD-10-CM

## 2023-09-04 DIAGNOSIS — L4050 Arthropathic psoriasis, unspecified: Secondary | ICD-10-CM | POA: Diagnosis not present

## 2023-10-15 ENCOUNTER — Other Ambulatory Visit: Payer: Self-pay | Admitting: Family Medicine

## 2023-10-15 DIAGNOSIS — I10 Essential (primary) hypertension: Secondary | ICD-10-CM

## 2023-10-15 DIAGNOSIS — E7800 Pure hypercholesterolemia, unspecified: Secondary | ICD-10-CM

## 2023-10-15 DIAGNOSIS — I152 Hypertension secondary to endocrine disorders: Secondary | ICD-10-CM

## 2023-10-15 NOTE — Telephone Encounter (Signed)
Pt has an appt end of January

## 2023-10-16 DIAGNOSIS — L401 Generalized pustular psoriasis: Secondary | ICD-10-CM | POA: Diagnosis not present

## 2023-10-16 DIAGNOSIS — M1991 Primary osteoarthritis, unspecified site: Secondary | ICD-10-CM | POA: Diagnosis not present

## 2023-10-16 DIAGNOSIS — G5601 Carpal tunnel syndrome, right upper limb: Secondary | ICD-10-CM | POA: Diagnosis not present

## 2023-10-16 DIAGNOSIS — Z79899 Other long term (current) drug therapy: Secondary | ICD-10-CM | POA: Diagnosis not present

## 2023-10-16 DIAGNOSIS — R7989 Other specified abnormal findings of blood chemistry: Secondary | ICD-10-CM | POA: Diagnosis not present

## 2023-10-16 DIAGNOSIS — E559 Vitamin D deficiency, unspecified: Secondary | ICD-10-CM | POA: Diagnosis not present

## 2023-10-16 DIAGNOSIS — L4050 Arthropathic psoriasis, unspecified: Secondary | ICD-10-CM | POA: Diagnosis not present

## 2023-10-16 DIAGNOSIS — M79605 Pain in left leg: Secondary | ICD-10-CM | POA: Diagnosis not present

## 2023-10-16 DIAGNOSIS — E669 Obesity, unspecified: Secondary | ICD-10-CM | POA: Diagnosis not present

## 2023-10-16 DIAGNOSIS — Z6838 Body mass index (BMI) 38.0-38.9, adult: Secondary | ICD-10-CM | POA: Diagnosis not present

## 2023-10-30 DIAGNOSIS — L4050 Arthropathic psoriasis, unspecified: Secondary | ICD-10-CM | POA: Diagnosis not present

## 2023-10-30 DIAGNOSIS — Z79899 Other long term (current) drug therapy: Secondary | ICD-10-CM | POA: Diagnosis not present

## 2023-10-30 DIAGNOSIS — Z111 Encounter for screening for respiratory tuberculosis: Secondary | ICD-10-CM | POA: Diagnosis not present

## 2023-10-30 DIAGNOSIS — R5383 Other fatigue: Secondary | ICD-10-CM | POA: Diagnosis not present

## 2023-11-02 LAB — LAB REPORT - SCANNED
EGFR: 48
HM Hepatitis Screen: NEGATIVE

## 2023-11-10 ENCOUNTER — Ambulatory Visit: Payer: Self-pay | Admitting: Skilled Nursing Facility1

## 2023-11-25 NOTE — Progress Notes (Signed)
Chief Complaint  Patient presents with   Medicare Wellness    Fasting AWV with pelvic. She needs refills on metformin and diltiazem. Stopped taking rx vit D over a year ago. Went back to intermittent fasting and has lost 12lbs. Will give urine on her way out, she needs to leave 4:15pm.    April Matthews is a 78 y.o. female who presents for annual wellness visit and follow-up on chronic medical conditions.  Patient arrived at appt time (rather than early), and had another obligation that required her to leave prior completing full visit, see below.   Diabetes:  Last seen 08/26/23, and A1c was 7.1%.  She declined starting [facility], wanted to do a 3 month trial of intermittent fasting and exercise. She agreed to get more DM education, referral was placed. Appt was scheduled for 1/14, but patient cancelled it (LM on Sunday night about canceling Monday's appt).  She never rescheduled it. She is compliant with taking metformin with dinner (1000mg  daily). She denies side effects. Sugars:   only checking over the last couple of weeks. Fasting sugars 121-174.  Had one 147, and one 174 (states had a bite of dessert the night before when eating out at a club the night before).  Bedtime was checked once, 112 last night.  She states that she didn't start intermittent fasting until early January (on week 3now).  She got up to 220# on home scale prior to starting this. Has lost 12# so far (still is over what she was at her last visit here.) Admitted to having donuts, and other junk that her husband brings her. He continues to buy snacks/donuts, even when she tells him not to. "I don't want to put medicine in my body if I can fix the issue by myself" She likes intermittent fasting, not a snacker.  She states she usually ignores the snacks her husband brings home. Ozempic had been prescribed by [facility], states it didn't work--"didn't change anything"--no decrease in hunger, no weight loss. She didn't have any SE  to Ozempic. Greggory Keen was prescribed, but patient declined to start this. It was prescribed twice. She states that it had been denied by insurance, but then was later told it would cost $1800/month (with her insurance).  She isn't sure about cost/coverage now (hasn't been checked, new changes in medicare this year).  Last diabetic eye exam was 03/2023, no retinopathy. She checks feet regularly, no concerns. Denies polydipsia, polyuria or hypoglycemia   Hypertension:  She is compliant with diltiazem 180 mg and valsartan 320 mg. She denies side effects. BP's are running  101-153/72-87  She denies headaches, dizziness, chest pain, palpitations, shortness of breath. Denies edema. Wearing compression socks most days, and edema has improved. Uses a motorized calf massager with her legs elevated upon coming home from work, which helps a lot.  BP Readings from Last 3 Encounters:  11/26/23 128/74  08/26/23 120/70  05/25/23 130/74   CKD--3b Compliant with valsartan, and diltiazem for HTN, with controlled BP's. Avoids NSAIDs. Most recent labs by rheumatologist showed improvement, with GFR 48, Cr 1.18.           Component Ref Range & Units (hover) 7 mo ago (04/01/23) 1 yr ago (08/18/22) 1 yr ago (07/04/22) 1 yr ago (06/19/22) 1 yr ago (01/30/22) 2 yr ago (10/02/21) 2 yr ago (06/10/21)  Glucose 210 High  124 High  121 High  112 High  130 High  143 High  108 High  R  BUN 26  26 45 High  42 High  36 High  26 25  Creatinine, Ser 1.26 High  1.31 High  1.69 High  1.66 High  1.23 High  1.16 High  1.07 High   eGFR 44 Low  42 Low  31 Low  32 Low  46 Low  49 Low  54 Low   BUN/Creatinine Ratio 21 20 27 25 29  High  22 23    Hyperlipidemia:  Tolerating atorvastatin without side effects.  Trying to follow lowfat, low cholesterol diet.  At goal on last check, past due to recheck. Lab Results  Component Value Date   CHOL 132 06/19/2022   HDL 54 06/19/2022   LDLCALC 53 06/19/2022   TRIG 146 06/19/2022    CHOLHDL 2.4 06/19/2022   She has had some borderline thyroid tests in the past, with TSH >4.  Last check in 09/2021 was normal.  She denies changes in hair/skin/bowels (just sporadic loose stool; nails always ridge, unchanged) or energy. +difficulty losing weight. Lab Results  Component Value Date   TSH 2.990 10/02/2021   Psoriatic arthritis:  Under the care of Dr. Dierdre Forth.  She is taking Simponi Aria infusion every 2 months from Dr. Shawnee Jasmarie Coppock office.  She has been on it for 3 years, and psoriasis resolved within 6 months.  Arthritis discomfort starts to recur just prior to her next infusion. She sees Dr. [place] regularly.  Vitamin D deficiency:  H/o mild deficiency in the past (level of 27 in the past); most recent level was 44 in 07/2022.  She was taking prescription ergocalciferol every other weekly per [facility].  She last took a dose of prescription D over a year ago. She states the pharmacy said they never got the September Rx (#6, q2 weeks, 90d supply). She never contacted [place] to let us know.  Has not been taking an OTC D3 Also states taking B3, and not B12 as stated in her med list.  She felt like her nails were better when she was on rx D, now getting ridged again.   Immunization History  Administered Date(s) Administered   Fluad Quad(high Dose 65+) 10/02/2021, 07/20/2023, 07/28/2023   Fluad Trivalent(High Dose 65+) 07/16/2023   Influenza Split 09/08/2012, 08/10/2014, 08/21/2015   Influenza, High Dose Seasonal PF 07/10/2016, 07/13/2017, 07/20/2018, 07/11/2020, 08/04/2022   PFIZER(Purple Top)SARS-COV-2 Vaccination 11/17/2019, 12/13/2019, 10/16/2020, 04/26/2021   PPD Test 09/08/2012   Pneumococcal Conjugate-13 04/20/2014   Pneumococcal Polysaccharide-23 04/23/2015   Tdap 06/19/2011   Last Pap smear: 05/2019 normal, no high risk HPV detected Last mammogram: 10/2017  Last colonoscopy: 07/2014 mild diverticulosis; Dr. Juanda Chance; repeat 10 years Last DEXA: 10/2017 T-1.7 L femoral neck.   Dentist: goes twice yearly Ophtho: twice yearly. Had cataract surgery in Aug/September. Exercise:  "not much".   Not using her exercise bike.  Walking some throughout the day, but not really for exercise.  States she does get active exercise per her FitBit.    Patient Care Team: Joselyn Arrow, MD as PCP - [facility]) Dr. Dierdre Forth (rheum) Dr. Amy Swaziland (derm) Dr. Randon Goldsmith (optho) Dr. Duke Salvia (retinal specialist/oncologist at [facility] ophtho)  Dr. Tera Mater (dentist)--no longer seeing, switching. Found new dentist will send info, can't remember name Dr. Juanda Chance (retired--GI) Dr. Manson Passey, William Hamburger, NP ([facility]) --no longer sees  Depression Screening: Flowsheet Row Office Visit from 11/26/2023 in [facility]  PHQ-2 Total Score 0       Falls screen:     11/26/2023    3:12 PM 12/15/2022  10:33 AM 06/19/2022    2:58 PM 06/19/2021    3:08 PM 06/11/2020    9:57 AM  Fall Risk   Falls in the past year? 0 0 1 0 0  Number falls in past yr: 0 0 0 0   Injury with Fall? 0 0 0 0   Risk for fall due to : No Fall Risks No Fall Risks No Fall Risks No Fall Risks   Follow up Falls evaluation completed Falls evaluation completed Falls evaluation completed Falls evaluation completed      Functional Status Survey: Is the patient deaf or have difficulty hearing?: No Does the patient have difficulty seeing, even when wearing glasses/contacts?: Yes (had cataract surgery 8/24 and 9/24 Dr. Randon Goldsmith) Does the patient have difficulty concentrating, remembering, or making decisions?: No Does the patient have difficulty walking or climbing stairs?: No Does the patient have difficulty dressing or bathing?: No Does the patient have difficulty doing errands alone such as visiting a doctor's office or shopping?: No  Mini-Cog Scoring: 5     End of Life Discussion:  Patient has a medical power of attorney (her daughter) and Living Will. We do not have copies.   PMH, PSH, SH and FH were reviewed and  updated  Outpatient Encounter Medications as of 11/26/2023  Medication Sig Note   Golimumab (SIMPONI ARIA IV) Inject into the vein every 8 (eight) weeks. 11/27/2023: Per Dr. Dierdre Forth   atorvastatin (LIPITOR) 20 MG tablet TAKE 1 TABLET(20 MG) BY MOUTH DAILY    Blood Glucose Monitoring Suppl DEVI 1 each by Does not apply route daily. Test 1-2 times daily.    calcium carbonate (TUMS - DOSED IN MG ELEMENTAL CALCIUM) 500 MG chewable tablet Chew 1 tablet by mouth daily. (Patient not taking: Reported on 08/26/2023)    cyanocobalamin (VITAMIN B12) 1000 MCG tablet Take 1,000 mcg by mouth daily. 11/26/2023: She states today that she is taking a B3 gummy, NOT a B12 recently   diltiazem (TIAZAC) 180 MG 24 hr capsule Take 1 capsule (180 mg total) by mouth daily.    EPINEPHrine 0.3 mg/0.3 mL IJ SOAJ injection Inject 0.3 mg into the muscle as needed for anaphylaxis. (Patient not taking: Reported on 08/26/2023)    Glucose Blood (BLOOD GLUCOSE TEST STRIPS) STRP Patient to test 1-2 times daily.    ipratropium (ATROVENT) 0.03 % nasal spray INSTILL 2 SPRAYS INTO BOTH NOSTRILS TWICE DAILY    Lancet Device MISC 1-2 times daily    Lancets Misc. MISC 1-2 times a day    loratadine (CLARITIN) 10 MG tablet Take 10 mg by mouth daily. (Patient not taking: Reported on 08/26/2023) 08/26/2023: As needed   metFORMIN (GLUCOPHAGE-XR) 500 MG 24 hr tablet TAKE 2 TABLETS BY MOUTH AT NIGHT    Multiple Vitamin (MULTIVITAMIN) tablet Take 1 tablet by mouth daily.    Simethicone (GAS-X PO) Take 1 capsule by mouth as needed. (Patient not taking: Reported on 08/26/2023) 08/26/2023: As needed   tirzepatide [facility]) 2.5 MG/0.5ML Pen Inject 2.5 mg into the skin once a week. (Patient not taking: Reported on 08/26/2023) 08/26/2023: Never started   valsartan (DIOVAN) 320 MG tablet TAKE 1 TABLET(320 MG) BY MOUTH DAILY    Vitamin D, Ergocalciferol, (DRISDOL) 1.25 MG (50000 UNIT) CAPS capsule TAKE 1 CAPSULE BY MOUTH EVERY 14 DAYS (Patient not taking:  Reported on 08/26/2023) 08/26/2023: Ran out   [DISCONTINUED] diltiazem (TIAZAC) 180 MG 24 hr capsule TAKE 1 CAPSULE(180 MG) BY MOUTH DAILY    [DISCONTINUED] metFORMIN (GLUCOPHAGE-XR) 500  MG 24 hr tablet TAKE 2 TABLETS BY MOUTH AT NIGHT    No facility-administered encounter medications on file as of 11/26/2023.   Allergies  Allergen Reactions   Sesame Oil Anaphylaxis    Sesame seed/oil   Ace Inhibitors Cough   Bee Venom    Latex Rash     ROS:  The patient denies anorexia, fever, headaches,  vision changes, hearing changes, ear pain, sore throat, breast concerns, chest pain, palpitations, dizziness, syncope, dyspnea on exertion, cough, swelling, nausea, vomiting, diarrhea, constipation, abdominal pain, melena, hematochezia, hematuria, incontinence, dysuria, vaginal bleeding, discharge, odor or itch, genital lesions, numbness, tingling, weakness, tremor, suspicious skin lesions, depression, anxiety, abnormal bleeding/bruising, or enlarged lymph nodes. Denies any joint pains currently. Psoriasis--no skin flares since on Simponi. Sees derm. Weight gain and recent loss, per HPI   PHYSICAL EXAM:  BP 128/74   Pulse 72   Ht 5\' 2"  (1.575 m)   Wt 213 lb (96.6 kg)   BMI 38.96 kg/m   Wt Readings from Last 3 Encounters:  11/26/23 213 lb (96.6 kg)  08/26/23 211 lb 9.6 oz (96 kg)  05/25/23 209 lb 6.4 oz (95 kg)   Well-appearing, pleasant female, in good spirits. She is alert and oriented, in no distress. Normal mood, affect, hygiene and grooming. Grossly normal cranial nerves, normal gait.  She removed her compression socks, but visit was rushed due to her needing to leave--foot exam was not performed.  No obvious edema noted. Skin appeared normal.  Lab Results  Component Value Date   HGBA1C 7.0 (A) 11/26/2023    Recent labs from Dr. Dierdre Forth reviewed Performed 11/02/23--normal CBC, CRP of 2, negative quantiferon Gold. Negative hepatitis screens for B and C. C-met normal except for high  glucose (nonfasting?), Cr 1.18, GFR 48 (improved).   ASSESSMENT/PLAN:  Medicare annual wellness visit, subsequent  Type 2 diabetes mellitus with other specified complication, without long-term current use of insulin (HCC) - A1c 7%, wt up. Only recently started her desired plan of IF again. Not ready to start [facility] (plan if not improving at 3 mo f/u/today). R/s dietician visit - Plan: HgB A1c, Microalbumin / creatinine urine ratio, TSH, metFORMIN (GLUCOPHAGE-XR) 500 MG 24 hr tablet  Vitamin D deficiency - recheck level, states hasn't been taking Rx, didn't get September Rx - Plan: VITAMIN D 25 Hydroxy (Vit-D Deficiency, Fractures)  Essential hypertension - well controlled, cont current meds, low Na diet. Encouraged daily exercise and wt loss - Plan: diltiazem (TIAZAC) 180 MG 24 hr capsule  Stage 3b chronic kidney disease (HCC) - improved per recent labs from Dr. Dierdre Forth. Discussed importance of diabetes control to kidney health. BP well controlled. Avoid NSAIDs  Pure hypercholesterolemia - cont atorvastatin, low cholesterol diet. - Plan: Lipid panel  Osteopenia, unspecified location - discussed Ca, D, weight-bearing exercise.  Encouraged to schedule DEXA (re-ordered) - Plan: DG Bone Density  Psoriatic arthritis (HCC) - significantly improved, doing well on regimen per Dr. Dierdre Forth  Need for pneumococcal 20-valent conjugate vaccination - Plan: Pneumococcal conjugate vaccine 20-valent (Prevnar 20)  Postmenopausal estrogen deficiency - Plan: DG Bone Density   Discussed monthly self breast exams and yearly mammograms (past due).  Recommended at least 30 minutes of aerobic activity at least 5 days/week, weight-bearing exercise 2x/week; proper sunscreen use reviewed; healthy diet, including goals of calcium and vitamin D intake and alcohol recommendations (less than or equal to 1 drink/day) reviewed; regular seatbelt use; changing batteries in smoke detectors.  Immunization recommendations  discussed-- continue yearly high  dose flu shots.  Prevnar-20 was given today. Shingrix recommended, risks/side effects reviewed, to get from pharmacy.  TdaP due, to get from pharmacy.  COVID booster recommended, declined. RSV vaccine recommended, to get from the pharmacy. Colonoscopy is UTD--will be due again 07/2024.  Repeat DEXA recommended, ordered previously but never scheduled. Encouraged her to [facility] and schedule mammo and DEXA  She will return within 1-2 weeks to f/u on labs, and to complete exam, to include diabetic foot exam.    MOST form reviewed/signed. Full Code, Full Care Asked to get [place] copies of her living will and healthcare power of attorney.   Medicare Attestation I have personally reviewed: The patient's medical and social history Their use of alcohol, tobacco or illicit drugs Their current medications and supplements The patient's functional ability including ADLs,fall risks, home safety risks, cognitive, and hearing and visual impairment Diet and physical activities Evidence for depression or mood disorders  The patient's weight, height, BMI have been recorded in the chart.  I have made referrals, counseling, and provided education to the patient based on review of the above and I have provided the patient with a written personalized care plan for preventive services.

## 2023-11-25 NOTE — Patient Instructions (Incomplete)
HEALTH MAINTENANCE RECOMMENDATIONS:  It is recommended that you get at least 30 minutes of aerobic exercise at least 5 days/week (for weight loss, you may need as much as 60-90 minutes). This can be any activity that gets your heart rate up. This can be divided in 10-15 minute intervals if needed, but try and build up your endurance at least once a week.  Weight bearing exercise is also recommended twice weekly.  Eat a healthy diet with lots of vegetables, fruits and fiber.  "Colorful" foods have a lot of vitamins (ie green vegetables, tomatoes, red peppers, etc).  Limit sweet tea, regular sodas and alcoholic beverages, all of which has a lot of calories and sugar.  Up to 1 alcoholic drink daily may be beneficial for women (unless trying to lose weight, watch sugars).  Drink a lot of water.  Calcium recommendations are 1200-1500 mg daily (1500 mg for postmenopausal women or women without ovaries), and vitamin D 1000 IU daily.  This should be obtained from diet and/or supplements (vitamins), and calcium should not be taken all at once, but in divided doses.  Monthly self breast exams and yearly mammograms for women over the age of 9 is recommended.  Sunscreen of at least SPF 30 should be used on all sun-exposed parts of the skin when outside between the hours of 10 am and 4 pm (not just when at beach or pool, but even with exercise, golf, tennis, and yard work!)  Use a sunscreen that says "broad spectrum" so it covers both UVA and UVB rays, and make sure to reapply every 1-2 hours.  Remember to change the batteries in your smoke detectors when changing your clock times in the spring and fall. Carbon monoxide detectors are recommended for your home.  Use your seat belt every time you are in a car, and please drive safely and not be distracted with cell phones and texting while driving.   April Matthews , Thank you for taking time to come for your Medicare Wellness Visit. I appreciate your ongoing  commitment to your health goals. Please review the following plan we discussed and let me know if I can assist you in the future.   This is a list of the screening recommended for you and due dates:  Health Maintenance  Topic Date Due   Zoster (Shingles) Vaccine (1 of 2) Never done   DTaP/Tdap/Td vaccine (2 - Td or Tdap) 06/18/2021   Yearly kidney health urinalysis for diabetes  06/20/2023   Complete foot exam   06/20/2023   COVID-19 Vaccine (5 - 2024-25 season) 06/28/2023   Hemoglobin A1C  02/24/2024   Eye exam for diabetics  04/15/2024   Yearly kidney function blood test for diabetes  11/01/2024   Medicare Annual Wellness Visit  11/25/2024   Pneumonia Vaccine  Completed   Flu Shot  Completed   DEXA scan (bone density measurement)  Completed   Hepatitis C Screening  Completed   HPV Vaccine  Aged Out   Colon Cancer Screening  Discontinued   Please call the [facility] Center and schedule a mammogram. Please also schedule a bone density test. You are past due for both of these tests. Prior bone density test showed ostopenia (thinning) at your hip.  I recommend getting RSV vaccine from the pharmacy. You are past due for a tetanus booster. Please get this from the pharmacy.  I recommend getting the new shingles vaccine (Shingrix). Since you have Medicare, you will need to get this from  the pharmacy, as it is covered by Part D. This is a series of 2 injections, spaced 2 months apart.   This should be separated from other vaccines by at least 2 weeks.  Please bring [place] copies of your Living Will and Healthcare Power of Attorney so that it can be scanned into your medical chart.  Please reschedule your diabetes education session, and have your husband come with you. It is helpful for all parties to know what you should be buying/cooking/eating.

## 2023-11-26 ENCOUNTER — Encounter: Payer: Self-pay | Admitting: Family Medicine

## 2023-11-26 ENCOUNTER — Ambulatory Visit (INDEPENDENT_AMBULATORY_CARE_PROVIDER_SITE_OTHER): Payer: Medicare Other | Admitting: Family Medicine

## 2023-11-26 VITALS — BP 128/74 | HR 72 | Ht 62.0 in | Wt 213.0 lb

## 2023-11-26 DIAGNOSIS — Z780 Asymptomatic menopausal state: Secondary | ICD-10-CM

## 2023-11-26 DIAGNOSIS — L4050 Arthropathic psoriasis, unspecified: Secondary | ICD-10-CM

## 2023-11-26 DIAGNOSIS — I10 Essential (primary) hypertension: Secondary | ICD-10-CM | POA: Diagnosis not present

## 2023-11-26 DIAGNOSIS — M8580 Other specified disorders of bone density and structure, unspecified site: Secondary | ICD-10-CM | POA: Diagnosis not present

## 2023-11-26 DIAGNOSIS — E7800 Pure hypercholesterolemia, unspecified: Secondary | ICD-10-CM | POA: Diagnosis not present

## 2023-11-26 DIAGNOSIS — N1832 Chronic kidney disease, stage 3b: Secondary | ICD-10-CM

## 2023-11-26 DIAGNOSIS — Z23 Encounter for immunization: Secondary | ICD-10-CM | POA: Diagnosis not present

## 2023-11-26 DIAGNOSIS — E1169 Type 2 diabetes mellitus with other specified complication: Secondary | ICD-10-CM

## 2023-11-26 DIAGNOSIS — Z0000 Encounter for general adult medical examination without abnormal findings: Secondary | ICD-10-CM | POA: Diagnosis not present

## 2023-11-26 DIAGNOSIS — E559 Vitamin D deficiency, unspecified: Secondary | ICD-10-CM

## 2023-11-26 LAB — POCT GLYCOSYLATED HEMOGLOBIN (HGB A1C): Hemoglobin A1C: 7 % — AB (ref 4.0–5.6)

## 2023-11-26 MED ORDER — METFORMIN HCL ER 500 MG PO TB24: ORAL_TABLET | ORAL | 1 refills | Status: DC

## 2023-11-26 MED ORDER — DILTIAZEM HCL ER BEADS 180 MG PO CP24: 180.0000 mg | ORAL_CAPSULE | Freq: Every day | ORAL | 1 refills | Status: DC

## 2023-11-27 ENCOUNTER — Encounter: Payer: Self-pay | Admitting: Family Medicine

## 2023-11-27 ENCOUNTER — Other Ambulatory Visit: Payer: Self-pay | Admitting: Family Medicine

## 2023-11-27 DIAGNOSIS — Z0000 Encounter for general adult medical examination without abnormal findings: Secondary | ICD-10-CM

## 2023-11-27 LAB — LIPID PANEL
Chol/HDL Ratio: 2.5 {ratio} (ref 0.0–4.4)
Cholesterol, Total: 129 mg/dL (ref 100–199)
HDL: 51 mg/dL (ref >39)
LDL Chol Calc (NIH): 51 mg/dL (ref 0–99)
Triglycerides: 160 mg/dL — ABNORMAL HIGH (ref 0–149)
VLDL Cholesterol Cal: 27 mg/dL (ref 5–40)

## 2023-11-27 LAB — MICROALBUMIN / CREATININE URINE RATIO
Creatinine, Urine: 219.8 mg/dL
Microalb/Creat Ratio: 8 mg/g{creat} (ref 0–29)
Microalbumin, Urine: 18.1 ug/mL

## 2023-11-27 LAB — VITAMIN D 25 HYDROXY (VIT D DEFICIENCY, FRACTURES): Vit D, 25-Hydroxy: 81.4 ng/mL (ref 30.0–100.0)

## 2023-11-27 LAB — TSH: TSH: 2.62 u[IU]/mL (ref 0.450–4.500)

## 2023-12-08 ENCOUNTER — Ambulatory Visit: Payer: Medicare Other

## 2023-12-10 ENCOUNTER — Encounter: Payer: Self-pay | Admitting: *Deleted

## 2023-12-13 NOTE — Progress Notes (Unsigned)
No chief complaint on file.  Patient presents for physical exam. She was very rushed at her recent visit, and physical exam wasn't performed, just Medicare Wellness visit. She is due for breast/pelvic exam, and diabetic foot exam.  Diabetes:  A1c at last visit was 7% (had been 7.1 in October).  She hadn't gotten DM education (cancelled her appt). She had declined starting [facility] in October, wanting a 3 month trial of doing it on her own.  At her recent visit, however, she stated she had only recently started intermittent fasting again (in January), and that she wasn't ready to start [facility].  She will reconsider in 3 months if not managing better on her own (weight and diabetes).  She is compliant with taking metformin with dinner (1000mg  daily). She denies side effects. Sugars:     Obesity:  She reported getting up to 220# on home scale prior to restarting intermittent fasting program in early January. Husband brings home snacks/donuts, even when she tells him not to, and prior to her last visit, was still eating them.   UPDATE ***       PMH, PSH, SH reviewed    ROS: Denies fever, chills, URI symptoms, headaches, dizziness, shortness of breath, chest pain.  Denies nausea, vomiting, bowel changes, urinary complaints, bleeding, bruising, rash. See HPI     PHYSICAL EXAM:  There were no vitals taken for this visit.   General Appearance:     Alert, cooperative, no distress, appears stated age   Head:     Normocephalic, without obvious abnormality, atraumatic   Eyes:     PERRL, conjunctiva/corneas clear, EOM's intact, fundi benign   Ears:     Normal TM's and external ear canals   Nose:    Normal, no drainage or sinus tenderness  Throat:    Normal mucosa, no lesions  Neck:    Supple, no lymphadenopathy;  thyroid:  no enlargement/tenderness/nodules; no JVD.  No bruit appreciable   Back:     Spine nontender, no curvature, ROM normal, no CVA tenderness   Lungs:      Clear to  auscultation bilaterally without wheezes, rales or ronchi; respirations unlabored   Chest Wall:     No deformity or tenderness   Heart:     Regular rate and rhythm, S1 and S2 normal, no rub or gallop, 2/6 SEM   Breast Exam:     No tenderness, masses, or nipple discharge. Slightly inverted on the left (unchanged per pt).  No axillary lymphadenopathy   Abdomen:      Soft, non-tender, nondistended, normoactive bowel sounds,     no masses, no hepatosplenomegaly. Obese. WHSS.   Genitalia:     Normal external genitalia without lesions, +atrophic changes. BUS and vagina normal; no cervical motion tenderness. No abnormal vaginal discharge.  Uterus and adnexa not enlarged, but exam is somewhat limited by her body habitus; adnexa nontender, no masses.  Pap not performed.    Rectal:     Normal tone, no masses or tenderness; heme negative stool  Extremities:    No clubbing, cyanosis or edema   Pulses:    2+ and symmetric all extremities   Skin:    Skin turgor normal, no suspicious lesions.  Lymph nodes:    Cervical, supraclavicular, inguinal and axillary nodes normal   Neurologic:    Normal strength, sensation and gait; reflexes 2+ and symmetric throughout               Psych:   Normal  mood, affect, hygiene and grooming.          Normal diabetic foot exam.  DIABETIC FOOT EXAM *** UPDATE_\ *** sl inverted L nipple, 2/6 murmur  Recent labs discussed/reviewed: Lab Results  Component Value Date   TSH 2.620 11/26/2023   Vitamin D-OH 81.4  Urine microalbumin/Cr ratio 8   Lab Results  Component Value Date   HGBA1C 7.0 (A) 11/26/2023   Lab Results  Component Value Date   CHOL 129 11/26/2023   HDL 51 11/26/2023   LDLCALC 51 11/26/2023   TRIG 160 (H) 11/26/2023   CHOLHDL 2.5 11/26/2023    ASSESSMENT/PLAN:  I see she scheduled mammo and DEXA Has she rescheduled with nutritionist? Does she need to be given phone number? (She had cancelled 11/10/23 appt).  Did she get RSV vaccine (or Tdap or  Shingrix) yet? To get from pharmacy 2 weeks after vaccine from her visit.  This is going to be coded as an OFFICE VISIT (she has straight Medicare) But needs physical exam since it wasn't done at last visit bc she was in a rush.

## 2023-12-14 ENCOUNTER — Ambulatory Visit (INDEPENDENT_AMBULATORY_CARE_PROVIDER_SITE_OTHER): Payer: Medicare Other | Admitting: Family Medicine

## 2023-12-14 ENCOUNTER — Encounter: Payer: Self-pay | Admitting: Family Medicine

## 2023-12-14 VITALS — BP 130/70 | HR 68 | Ht 62.0 in | Wt 208.8 lb

## 2023-12-14 DIAGNOSIS — Z01419 Encounter for gynecological examination (general) (routine) without abnormal findings: Secondary | ICD-10-CM

## 2023-12-14 DIAGNOSIS — E1159 Type 2 diabetes mellitus with other circulatory complications: Secondary | ICD-10-CM

## 2023-12-14 DIAGNOSIS — I152 Hypertension secondary to endocrine disorders: Secondary | ICD-10-CM | POA: Diagnosis not present

## 2023-12-14 DIAGNOSIS — E1169 Type 2 diabetes mellitus with other specified complication: Secondary | ICD-10-CM | POA: Diagnosis not present

## 2023-12-14 DIAGNOSIS — N1832 Chronic kidney disease, stage 3b: Secondary | ICD-10-CM

## 2023-12-14 DIAGNOSIS — E7800 Pure hypercholesterolemia, unspecified: Secondary | ICD-10-CM | POA: Diagnosis not present

## 2023-12-14 DIAGNOSIS — E559 Vitamin D deficiency, unspecified: Secondary | ICD-10-CM

## 2023-12-14 DIAGNOSIS — M8580 Other specified disorders of bone density and structure, unspecified site: Secondary | ICD-10-CM

## 2023-12-14 DIAGNOSIS — L4050 Arthropathic psoriasis, unspecified: Secondary | ICD-10-CM

## 2023-12-14 DIAGNOSIS — I10 Essential (primary) hypertension: Secondary | ICD-10-CM

## 2023-12-14 DIAGNOSIS — Z780 Asymptomatic menopausal state: Secondary | ICD-10-CM

## 2023-12-14 NOTE — Patient Instructions (Addendum)
Please get back to [place] (phone call or MyChart message) with your dose of vitamin D3.  I know that you reported taking 2 pills daily, I need to know the dose.  Your level was higher than I thought it would be if taking just 1000 IU (which would be 2000 IU plus what's in your multivitamin, if taking 2 of the D3 daily).  Ideally, you should take your metformin prior to or at the beginning of your meal, rather than at bedtime.  Normal fasting sugars is <100.  For people with diabetes, <125 will be a good A1c. 2 hours after a meal can be up to 140 (for non-diabetics). Up to 160 will be a good A1c.  Check more often 2 hours after dinner or at bedtime, to help get an idea of which foods might be triggering higher sugars. You can check this more often than before your first meal.   Remember to get the RSV vaccine, Tetanus (TdaP) and shingles vaccines from the pharmacy.

## 2023-12-15 ENCOUNTER — Ambulatory Visit
Admission: RE | Admit: 2023-12-15 | Discharge: 2023-12-15 | Disposition: A | Discharge status: Home / Self Care | Payer: Medicare Other | Source: Ambulatory Visit | Attending: Family Medicine | Admitting: Family Medicine

## 2023-12-15 DIAGNOSIS — Z1231 Encounter for screening mammogram for malignant neoplasm of breast: Secondary | ICD-10-CM | POA: Diagnosis not present

## 2023-12-15 DIAGNOSIS — Z0000 Encounter for general adult medical examination without abnormal findings: Secondary | ICD-10-CM

## 2023-12-24 ENCOUNTER — Other Ambulatory Visit: Payer: Self-pay | Admitting: Family Medicine

## 2023-12-24 DIAGNOSIS — E7800 Pure hypercholesterolemia, unspecified: Secondary | ICD-10-CM

## 2023-12-25 DIAGNOSIS — L4050 Arthropathic psoriasis, unspecified: Secondary | ICD-10-CM | POA: Diagnosis not present

## 2024-01-23 ENCOUNTER — Other Ambulatory Visit: Payer: Self-pay | Admitting: Family Medicine

## 2024-01-23 DIAGNOSIS — I10 Essential (primary) hypertension: Secondary | ICD-10-CM

## 2024-01-23 DIAGNOSIS — E1159 Type 2 diabetes mellitus with other circulatory complications: Secondary | ICD-10-CM

## 2024-02-18 ENCOUNTER — Encounter: Payer: Medicare Other | Admitting: Family Medicine

## 2024-02-19 DIAGNOSIS — L4050 Arthropathic psoriasis, unspecified: Secondary | ICD-10-CM | POA: Diagnosis not present

## 2024-02-25 ENCOUNTER — Encounter: Payer: Self-pay | Admitting: *Deleted

## 2024-02-29 ENCOUNTER — Encounter: Admitting: Family Medicine

## 2024-02-29 DIAGNOSIS — E1159 Type 2 diabetes mellitus with other circulatory complications: Secondary | ICD-10-CM

## 2024-02-29 DIAGNOSIS — E1169 Type 2 diabetes mellitus with other specified complication: Secondary | ICD-10-CM

## 2024-03-02 ENCOUNTER — Encounter: Admitting: Family Medicine

## 2024-03-09 ENCOUNTER — Encounter: Admitting: Family Medicine

## 2024-03-23 ENCOUNTER — Encounter: Admitting: Family Medicine

## 2024-03-29 NOTE — Progress Notes (Unsigned)
 No chief complaint on file.  Patient presents for 3 month follow-up on diabetes and obesity.  Diabetes:  Last A1c was 7% in 10/2023. She is compliant with taing metformin   1000 mg daily. At her last visit we encouraged her to switch taking metformin  to with/prior to meals, rather than at bedtime. She was encouraged to schedule diabetes education (she had put it off, wanting to try on her own first).  She was encouraged to bring her glucose log to visit with nutritionist.   We had discussed starting [employer]  in October 2024, but she wanted 3 mos dietary trial. She had only started IF again in 10/2023 so wasn't ready to discuss/start [employer]  at that time). Today she presents after ongoing trial of intermittent fasting (diet and exercise) to reassess control of DM and her weight.  Sugars are running ***     Obesity:  She reported getting up to 220# on home scale prior to restarting intermittent fasting program in early January 2025. She was down to 208# at her last visit 3 months ago.   At that time, she reported that her husband had been better about not bringing home chocolate donuts or other tempting sweets. She had also started exercising--11/2023 reported exercise bike x 35 minutes each night, plus getting 5K steps daily. She is carrying 2# weights when walking.  Currently she reports ***   Wt Readings from Last 3 Encounters:  12/14/23 208 lb 12.8 oz (94.7 kg)  11/26/23 213 lb (96.6 kg)  08/26/23 211 lb 9.6 oz (96 kg)      PMH, PSH, SH reviewed   ROS: Denies fever, chills, URI symptoms, headaches, dizziness, shortness of breath, chest pain. Denies nausea, vomiting, bowel changes, urinary complaints, bleeding, bruising, rash.     PHYSICAL EXAM:  There were no vitals taken for this visit.  Wt Readings from Last 3 Encounters:  12/14/23 208 lb 12.8 oz (94.7 kg)  11/26/23 213 lb (96.6 kg)  08/26/23 211 lb 9.6 oz (96 kg)       ASSESSMENT/PLAN:  A1c   She never scheduled  DM education as we had discussed...  She never contacted us  via MyChart about vitamin D  dose--please make sure we get this She had stated taking 2 pills/d.  level was higher than I thought it would be if taking just 1000 IU (which would be 2000 IU plus what's in your multivitamin, if taking 2 of the D3 daily).   Schedule AWV 11/2024 Med check in 4 mos

## 2024-03-30 ENCOUNTER — Encounter: Payer: Self-pay | Admitting: Family Medicine

## 2024-03-30 ENCOUNTER — Ambulatory Visit (INDEPENDENT_AMBULATORY_CARE_PROVIDER_SITE_OTHER): Admitting: Family Medicine

## 2024-03-30 VITALS — BP 128/74 | HR 80 | Ht 62.0 in | Wt 203.6 lb

## 2024-03-30 DIAGNOSIS — E66812 Obesity, class 2: Secondary | ICD-10-CM

## 2024-03-30 DIAGNOSIS — E1159 Type 2 diabetes mellitus with other circulatory complications: Secondary | ICD-10-CM

## 2024-03-30 DIAGNOSIS — E559 Vitamin D deficiency, unspecified: Secondary | ICD-10-CM | POA: Diagnosis not present

## 2024-03-30 DIAGNOSIS — N1832 Chronic kidney disease, stage 3b: Secondary | ICD-10-CM

## 2024-03-30 DIAGNOSIS — I152 Hypertension secondary to endocrine disorders: Secondary | ICD-10-CM | POA: Diagnosis not present

## 2024-03-30 DIAGNOSIS — E7800 Pure hypercholesterolemia, unspecified: Secondary | ICD-10-CM

## 2024-03-30 DIAGNOSIS — E1169 Type 2 diabetes mellitus with other specified complication: Secondary | ICD-10-CM | POA: Diagnosis not present

## 2024-03-30 DIAGNOSIS — Z6837 Body mass index (BMI) 37.0-37.9, adult: Secondary | ICD-10-CM | POA: Diagnosis not present

## 2024-03-30 LAB — POCT GLYCOSYLATED HEMOGLOBIN (HGB A1C): Hemoglobin A1C: 6.7 % — AB (ref 4.0–5.6)

## 2024-03-30 NOTE — Patient Instructions (Addendum)
 Switch your metformin  to taking it before or with dinner (since you are often home for that meal, as opposed to your lunch).  Try and keep a food journal for at least a few days prior to your visit with the dietician. Also bring your list of sugars to that visit.  Please exercise most days.  Get back on the bike, especially on days that you aren't in the pool. You can walk more frequently in shorter intervals until your leg feels better, in which case you can walk more at once. Try and use the handweights at least 2x/week.  Your vitamin D  level was on the higher end, during winter, on the last check, when taking the current gummy and multivitamin. Starting now, stop the gummy-- only take your multivitamin, no extra vitamin D3. You may need to restart over the winter (but only 1 gummy/day, not 2).  Stay well hydrated. Use antihistamines for allergies. Try Mucinex and sinus rinses to help with congestion and cough. Contact us  for re-evaluation if you develop discolored mucus, sinus pain, shortness of breath, fever or other concerns.

## 2024-04-12 DIAGNOSIS — L738 Other specified follicular disorders: Secondary | ICD-10-CM | POA: Diagnosis not present

## 2024-04-12 DIAGNOSIS — I8311 Varicose veins of right lower extremity with inflammation: Secondary | ICD-10-CM | POA: Diagnosis not present

## 2024-04-12 DIAGNOSIS — I872 Venous insufficiency (chronic) (peripheral): Secondary | ICD-10-CM | POA: Diagnosis not present

## 2024-04-12 DIAGNOSIS — D1801 Hemangioma of skin and subcutaneous tissue: Secondary | ICD-10-CM | POA: Diagnosis not present

## 2024-04-12 DIAGNOSIS — I788 Other diseases of capillaries: Secondary | ICD-10-CM | POA: Diagnosis not present

## 2024-04-12 DIAGNOSIS — L400 Psoriasis vulgaris: Secondary | ICD-10-CM | POA: Diagnosis not present

## 2024-04-12 DIAGNOSIS — I8312 Varicose veins of left lower extremity with inflammation: Secondary | ICD-10-CM | POA: Diagnosis not present

## 2024-04-15 ENCOUNTER — Other Ambulatory Visit: Payer: Self-pay | Admitting: Family Medicine

## 2024-04-15 DIAGNOSIS — E7800 Pure hypercholesterolemia, unspecified: Secondary | ICD-10-CM

## 2024-04-15 DIAGNOSIS — E1159 Type 2 diabetes mellitus with other circulatory complications: Secondary | ICD-10-CM

## 2024-04-15 DIAGNOSIS — L4050 Arthropathic psoriasis, unspecified: Secondary | ICD-10-CM | POA: Diagnosis not present

## 2024-04-15 DIAGNOSIS — I10 Essential (primary) hypertension: Secondary | ICD-10-CM

## 2024-04-22 DIAGNOSIS — R7989 Other specified abnormal findings of blood chemistry: Secondary | ICD-10-CM | POA: Diagnosis not present

## 2024-04-22 DIAGNOSIS — M1991 Primary osteoarthritis, unspecified site: Secondary | ICD-10-CM | POA: Diagnosis not present

## 2024-04-22 DIAGNOSIS — L4050 Arthropathic psoriasis, unspecified: Secondary | ICD-10-CM | POA: Diagnosis not present

## 2024-04-22 DIAGNOSIS — L401 Generalized pustular psoriasis: Secondary | ICD-10-CM | POA: Diagnosis not present

## 2024-04-22 DIAGNOSIS — M79605 Pain in left leg: Secondary | ICD-10-CM | POA: Diagnosis not present

## 2024-04-22 DIAGNOSIS — G5601 Carpal tunnel syndrome, right upper limb: Secondary | ICD-10-CM | POA: Diagnosis not present

## 2024-04-22 DIAGNOSIS — Z79899 Other long term (current) drug therapy: Secondary | ICD-10-CM | POA: Diagnosis not present

## 2024-04-22 DIAGNOSIS — E669 Obesity, unspecified: Secondary | ICD-10-CM | POA: Diagnosis not present

## 2024-04-22 DIAGNOSIS — E559 Vitamin D deficiency, unspecified: Secondary | ICD-10-CM | POA: Diagnosis not present

## 2024-04-22 DIAGNOSIS — Z6835 Body mass index (BMI) 35.0-35.9, adult: Secondary | ICD-10-CM | POA: Diagnosis not present

## 2024-05-12 ENCOUNTER — Encounter: Payer: Self-pay | Attending: Family Medicine | Admitting: Skilled Nursing Facility1

## 2024-05-12 ENCOUNTER — Encounter: Payer: Self-pay | Admitting: Skilled Nursing Facility1

## 2024-05-12 VITALS — Ht 62.0 in | Wt 204.0 lb

## 2024-05-12 DIAGNOSIS — E669 Obesity, unspecified: Secondary | ICD-10-CM | POA: Insufficient documentation

## 2024-05-12 DIAGNOSIS — E1169 Type 2 diabetes mellitus with other specified complication: Secondary | ICD-10-CM | POA: Insufficient documentation

## 2024-05-12 NOTE — Progress Notes (Signed)
 Other Dx: GERD Heart murmur HTN Kidney disease   A1C 6.7  DM medications: Metformin   Pt states she has done intermittent fasting over the years and feels that works for her. Pt states she weighs every day.  Pt states she fasts for 16 hours.  Pt arrives with a log of her sugars stating she has not done them recently.  Pt states she started using OWYN protein shake.  Pt states artificial sweeteners cause stomach issues. Pt states she wants to lose weight and keep A1C controlled.  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  Pt states she has a pool and likes to swim.  Pt states she avoids eggplant and tomatoes because they disrupt her system. Pt states she loves to cook and uses fresh ingredients. Pt states she thought her blood sugars should be 90 after eating a plate of pasta with garlic bread dietitian assured pt her A1C is very well controlled and 153 2 hours after eating pasta is well within the controlled blood sugar range.   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     Diabetes Self-Management Education  Visit Type: First/Initial  Appt. Start Time: 2:04 Appt. End Time: 3:15  05/12/2024  April Matthews, identified by name and date of birth, is a 78 y.o. female with a diagnosis of Diabetes: Type 2.   ASSESSMENT  Height 5' 2 (1.575 m), weight 204 lb (92.5 kg). Body mass index is 37.31 kg/m.   Diabetes  Self-Management Education - 05/12/24 1511       Visit Information   Visit Type First/Initial      Initial Visit   Diabetes Type Type 2    Are you currently following a meal plan? Yes    Are you taking your medications as prescribed? Yes      Health Coping   How would you rate your overall health? Good      Psychosocial Assessment   Patient Belief/Attitude about Diabetes Motivated to manage diabetes    What is the hardest part about your diabetes right now, causing you the most concern, or is the most worrisome to you about your diabetes?   Making healty food and beverage choices;Being active    Self-care barriers None    Self-management support Family    Patient Concerns Nutrition/Meal planning;Glycemic Control;Weight Control    Special Needs None    Preferred Learning Style Visual    Learning Readiness Change in progress    How often do you need to have someone help you when you read instructions, pamphlets, or other written materials from your doctor or pharmacy? 1 - Never      Pre-Education Assessment   Patient understands the diabetes disease and treatment process. Needs Instruction    Patient understands incorporating nutritional management into lifestyle. Needs Instruction    Patient undertands incorporating physical activity into lifestyle. Needs Instruction  Patient understands using medications safely. Needs Instruction    Patient understands monitoring blood glucose, interpreting and using results Needs Instruction    Patient understands prevention, detection, and treatment of acute complications. Needs Instruction    Patient understands prevention, detection, and treatment of chronic complications. Needs Instruction    Patient understands how to develop strategies to address psychosocial issues. Needs Instruction    Patient understands how to develop strategies to promote health/change behavior. Needs Instruction      Complications   Last HgB A1C per patient/outside  source 6.7 %    How often do you check your blood sugar? 0 times/day (not testing)    Have you had a dilated eye exam in the past 12 months? Yes    Have you had a dental exam in the past 12 months? Yes    Are you checking your feet? Yes      Activity / Exercise   Activity / Exercise Type Light (walking / raking leaves)    How many days per week do you exercise? 3    How many minutes per day do you exercise? 45    Total minutes per week of exercise 135      Patient Education   Previous Diabetes Education Yes    Disease Pathophysiology Definition of diabetes, type 1 and 2, and the diagnosis of diabetes;Factors that contribute to the development of diabetes    Healthy Eating Role of diet in the treatment of diabetes and the relationship between the three main macronutrients and blood glucose level;Food label reading, portion sizes and measuring food.;Carbohydrate counting;Reviewed blood glucose goals for pre and post meals and how to evaluate the patients' food intake on their blood glucose level.;Meal timing in regards to the patients' current diabetes medication.;Information on hints to eating out and maintain blood glucose control.;Meal options for control of blood glucose level and chronic complications.    Being Active Role of exercise on diabetes management, blood pressure control and cardiac health.;Identified with patient nutritional and/or medication changes necessary with exercise.    Medications Taught/reviewed insulin /injectables, injection, site rotation, insulin /injectables storage and needle disposal.;Reviewed patients medication for diabetes, action, purpose, timing of dose and side effects.    Monitoring Taught/evaluated SMBG meter.;Daily foot exams;Yearly dilated eye exam;Interpreting lab values - A1C, lipid, urine microalbumina.    Acute complications Taught prevention, symptoms, and  treatment of hypoglycemia - the 15 rule.;Discussed and identified patients' prevention, symptoms,  and treatment of hyperglycemia.    Chronic complications Dental care;Retinopathy and reason for yearly dilated eye exams;Nephropathy, what it is, prevention of, the use of ACE, ARB's and early detection of through urine microalbumia.;Lipid levels, blood glucose control and heart disease;Assessed and discussed foot care and prevention of foot problems    Diabetes Stress and Support Role of stress on diabetes;Worked with patient to identify barriers to care and solutions      Individualized Goals (developed by patient)   Nutrition Follow meal plan discussed;General guidelines for healthy choices and portions discussed    Physical Activity Exercise 5-7 days per week;30 minutes per day    Monitoring  Test my blood glucose as discussed;Test blood glucose pre and post meals as discussed    Problem Solving Eating Pattern    Reducing Risk do foot checks daily;treat hypoglycemia with 15 grams of carbs if blood glucose less than 70mg /dL;check ketones if blood glucose over 240mg /dL      Post-Education Assessment   Patient understands the diabetes disease and treatment process. Demonstrates understanding /  competency    Patient understands incorporating nutritional management into lifestyle. Demonstrates understanding / competency    Patient undertands incorporating physical activity into lifestyle. Demonstrates understanding / competency    Patient understands using medications safely. Demonstrates understanding / competency    Patient understands monitoring blood glucose, interpreting and using results Demonstrates understanding / competency    Patient understands prevention, detection, and treatment of acute complications. Demonstrates understanding / competency    Patient understands prevention, detection, and treatment of chronic complications. Demonstrates understanding / competency    Patient understands how to develop strategies to address psychosocial issues. Demonstrates understanding / competency     Patient understands how to develop strategies to promote health/change behavior. Demonstrates understanding / competency      Outcomes   Expected Outcomes Demonstrated interest in learning. Expect positive outcomes    Future DMSE 4-6 wks    Program Status Completed          Individualized Plan for Diabetes Self-Management Training:   Learning Objective:  Patient will have a greater understanding of diabetes self-management. Patient education plan is to attend individual and/or group sessions per assessed needs and concerns.    Expected Outcomes:  Demonstrated interest in learning. Expect positive outcomes  Education material provided: [name] - How to Thrive: A Guide for Your Journey with Diabetes, Food label handouts, A1C conversion sheet, Meal plan card, My Plate, and Snack sheet  If problems or questions, patient to contact team via:  Phone and Email  Future DSME appointment: 4-6 wks

## 2024-05-13 ENCOUNTER — Other Ambulatory Visit: Payer: Self-pay | Admitting: Family Medicine

## 2024-05-13 DIAGNOSIS — I10 Essential (primary) hypertension: Secondary | ICD-10-CM

## 2024-05-24 ENCOUNTER — Ambulatory Visit: Payer: Self-pay

## 2024-05-24 NOTE — Telephone Encounter (Signed)
 FYI Only or Action Required?: FYI only for provider.  Patient was last seen in primary care on 03/30/2024 by Randol Dawes, MD.  Called Nurse Triage reporting Knee Pain.  Symptoms began x 1 month.  Interventions attempted: OTC medications: Tylenol .  Symptoms are: unchanged.  Triage Disposition: See PCP When Office is Open (Within 3 Days)  Patient/caregiver understands and will follow disposition?: Yes  **Appt scheduled for 7/31**           Copied from CRM #[id number]. Topic: Clinical - Red Word Triage >> May 24, 2024 12:07 PM Charlet HERO wrote: Red Word that prompted transfer to Nurse Triage: Patient is calling about pain behind her left knee she says that it has been going on for about two weeks states it moves around. Reason for Disposition  [1] MODERATE pain (e.g., interferes with normal activities, limping) AND [2] present > 3 days  Answer Assessment - Initial Assessment Questions 1. LOCATION and RADIATION: Where is the pain located?      Left knee  2. QUALITY: What does the pain feel like?  (e.g., sharp, dull, aching, burning)     She states the pain feels like muscle pain/pulling  3. SEVERITY: How bad is the pain? What does it keep you from doing?   (Scale 1-10; or mild, moderate, severe)     At times an 8/10, currently mild   4. ONSET: When did the pain start? Does it come and go, or is it there all the time?     X 1 month  5. RECURRENT: Have you had this pain before? If Yes, ask: When, and what happened then?     No   6. SETTING: Has there been any recent work, exercise or other activity that involved that part of the body?      No   7. AGGRAVATING FACTORS: What makes the knee pain worse? (e.g., walking, climbing stairs, running)     Standing makes it worse, sitting helps   8. ASSOCIATED SYMPTOMS: Is there any swelling or redness of the knee?     No   9. OTHER SYMPTOMS: Do you have any other symptoms? (e.g., calf pain, chest pain,  difficulty breathing, fever) No   Tylenol  providing some relief.  Protocols used: Knee Pain-A-AH

## 2024-05-25 NOTE — Progress Notes (Unsigned)
 No chief complaint on file.  pain behind her left knee she says that it has been going on for about two weeks states it moves around  pain feels like muscle pain/pulling  Standing makes it worse, sitting helps    Diabetes: Controlled with metformin  Saw nutritionist 7/17  Lab Results  Component Value Date   HGBA1C 6.7 (A) 03/30/2024   Last Cr 1.18, eGFR 48 (10/2023, labs with Dr. Mai).   PMH, PSH, SH reviewed   ROS:    PHYSICAL EXAM:  There were no vitals taken for this visit.  Wt Readings from Last 3 Encounters:  05/12/24 204 lb (92.5 kg)  03/30/24 203 lb 9.6 oz (92.4 kg)  12/14/23 208 lb 12.8 oz (94.7 kg)       ASSESSMENT/PLAN:

## 2024-05-26 ENCOUNTER — Ambulatory Visit (INDEPENDENT_AMBULATORY_CARE_PROVIDER_SITE_OTHER): Admitting: Family Medicine

## 2024-05-26 ENCOUNTER — Encounter: Payer: Self-pay | Admitting: Family Medicine

## 2024-05-26 VITALS — BP 124/82 | HR 60 | Wt 206.6 lb

## 2024-05-26 DIAGNOSIS — M25562 Pain in left knee: Secondary | ICD-10-CM

## 2024-05-26 DIAGNOSIS — G8929 Other chronic pain: Secondary | ICD-10-CM

## 2024-05-26 DIAGNOSIS — M1712 Unilateral primary osteoarthritis, left knee: Secondary | ICD-10-CM | POA: Diagnosis not present

## 2024-05-26 MED ORDER — MELOXICAM 15 MG PO TABS: 15.0000 mg | ORAL_TABLET | Freq: Every day | ORAL | 0 refills | Status: DC

## 2024-05-27 NOTE — Progress Notes (Signed)
 Name: April Matthews   Date of Visit: 05/27/24   Date of last visit with me: Visit date not found   CHIEF COMPLAINT:  Chief Complaint  Patient presents with   Acute Visit    Knee pain. Been having trouble for years. About 5 years ago had an cortisone injection. Back of left knee on the outer side, starts in the middle and moves up. Hurts more when she walks a lot. Has moved to the front and top of knee. Pain scale today is a 3 today but has not done a lot of walking. Increasing to everyday pain.        HPI:  Discussed the use of AI scribe software for clinical note transcription with the patient, who gave verbal consent to proceed.  History of Present Illness   April Matthews is a 78 year old female with psoriatic arthritis who presents with chronic left knee pain.  She has experienced left knee pain for about ten years. Approximately five to six years ago, she consulted a knee specialist who diagnosed early meniscal degeneration and administered a cortisone injection. The pain is primarily located in the muscle around the knee and sometimes directly under it, moving around occasionally. She experiences tightness in the back of the knee, especially in the morning when getting out of bed. The pain is exacerbated by certain activities, such as swimming, but tends to subside after about ten minutes of reduced activity.  She has psoriatic arthritis and receives [facility] injections every two months. She uses Voltaren gel for pain relief and takes Tylenol  instead of Advil due to kidney concerns. She also experiences leg and ankle swelling, for which she is on medication.  Her physical activity includes swimming, which she attempts to do three times a week, although she has to moderate her activity level due to knee pain. 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  She has a history of a knee injury from a fall, which has  resulted in scar tissue formation. She is unable to take NSAIDs like Advil due to kidney issues.         OBJECTIVE:       05/12/2024    2:08 PM  Depression screen PHQ 2/9  Decreased Interest 0  Down, Depressed, Hopeless 0  PHQ - 2 Score 0     BP Readings from Last 3 Encounters:  05/26/24 124/82  03/30/24 128/74  12/14/23 130/70    BP 124/82   Pulse 60   Wt 206 lb 9.6 oz (93.7 kg)   SpO2 99%   BMI 37.79 kg/m    Physical Exam   MUSCULOSKELETAL: Tenderness in the left knee on palpation. Pain on lifting the left leg. Normal range of motion in the left knee. Pain on palpation of the upper left knee. Mild spurring on the superior patellar aspect of the left knee. Mild Baker's cyst in the left knee. Degenerative changes with calcifications in the medial meniscus of the left knee.      Physical Exam Constitutional:      Appearance: Normal appearance.  Cardiovascular:     Rate and Rhythm: Normal rate and regular rhythm.  Neurological:     Mental Status: She is alert.     Inspection reveals some mild swelling as well as bony abnormalities of the left knee joint specifically over the patella area.  There is a mild tenderness to palpation over the medial  lateral joint lines as well as in the posterior aspect of the knee near the popliteal fossa.  Range of motion with flexion extension knee is full.  McMurray's is negative bilaterally.  ASSESSMENT/PLAN:   Assessment & Plan Chronic pain of left knee  Primary osteoarthritis of left knee    Assessment and Plan    Left knee osteoarthritis with meniscal degeneration and Baker's cyst Chronic left knee pain with meniscal degeneration and Baker's cyst. Imaging shows mild spurring, effusion, scar tissue, and degenerative changes. Conservative management preferred. - Order x-rays at [facility]. - Prescribe meloxicam  for 10 days. - Advise use of a compression sleeve. - Recommend icing the knee once or twice daily. - Discuss  potential for steroid or gel injections if needed.  Chronic kidney disease Chronic kidney disease with well-managed function. Meloxicam  considered safe for short-term use. - Monitor kidney function during meloxicam  use.         Calyx Hawker A. Vita MD [facility]

## 2024-05-29 ENCOUNTER — Other Ambulatory Visit: Payer: Self-pay | Admitting: Family Medicine

## 2024-05-29 DIAGNOSIS — E1169 Type 2 diabetes mellitus with other specified complication: Secondary | ICD-10-CM

## 2024-06-10 DIAGNOSIS — L4050 Arthropathic psoriasis, unspecified: Secondary | ICD-10-CM | POA: Diagnosis not present

## 2024-06-29 ENCOUNTER — Telehealth: Payer: Self-pay | Admitting: Family Medicine

## 2024-06-29 NOTE — Telephone Encounter (Unsigned)
 Copied from CRM [id number]. Topic: Appointments - Scheduling Inquiry for Clinic >> Jun 29, 2024  3:49 PM Donee H wrote: Reason for CRM: Patient states need to reschedule appointment for 07-01-2024 with Dr. Vita, unable to make appointment. She states Fridays are good for her or afternoons on Tuesdays and Thursday. Please follow up with patient to reschedule appointment. There were no availiblity showing on my end. Callback number [phone]

## 2024-07-01 ENCOUNTER — Ambulatory Visit: Admitting: Family Medicine

## 2024-07-14 ENCOUNTER — Other Ambulatory Visit: Payer: Self-pay | Admitting: Family Medicine

## 2024-07-14 DIAGNOSIS — E7800 Pure hypercholesterolemia, unspecified: Secondary | ICD-10-CM

## 2024-07-19 ENCOUNTER — Other Ambulatory Visit: Payer: Medicare Other

## 2024-07-26 ENCOUNTER — Other Ambulatory Visit: Payer: Self-pay | Admitting: Family Medicine

## 2024-07-26 DIAGNOSIS — I152 Hypertension secondary to endocrine disorders: Secondary | ICD-10-CM

## 2024-07-26 DIAGNOSIS — Z23 Encounter for immunization: Secondary | ICD-10-CM | POA: Diagnosis not present

## 2024-07-26 DIAGNOSIS — I10 Essential (primary) hypertension: Secondary | ICD-10-CM

## 2024-08-02 ENCOUNTER — Encounter: Payer: Self-pay | Admitting: *Deleted

## 2024-08-02 ENCOUNTER — Ambulatory Visit: Admitting: Skilled Nursing Facility1

## 2024-08-02 NOTE — Progress Notes (Unsigned)
 No chief complaint on file.  Patient presents for 3 month follow-up on diabetes and obesity.  She saw Dr. Vita for L knee pain in late July. He prescribed a course of Meloxicam , recommended getting x-rays, use of compression sleeve, icing knee, and to f/u for potential steroid or gel injections. She didn't get the x-rays done, and didn't come for the 6 week f/u appointment.  Today she reports her knee ***    Diabetes:  Last A1c was 6.7% in 03/2024, improved from 7% in 10/2023.  She had followed recommendation to take metformin  prior to (or with) meals, rather than at bedtime, but ultimately fell back into taking her meds all at night. ***when taking metformin ? ***  She saw the nutritionist once in July, and has f/u scheduled in November.  Changes made?? ***  Sugars are running ***  We had previously discussed Mounjaro , but she was not interested. She instead started doing intermittent fasting again, along with more regular exercise, and by her last visit with me in June, she had lost 10# since 10/2023.       Obesity:  She reported getting up to 220# on home scale prior to restarting intermittent fasting program in early January 2025. She was down to 203# at her June visit.   Still IF?   Changes from dietitican?   Current exercise includes   Last visit-- mainly just walking (15 minutes/day). She stopped the bike when she thinks she pulled her hamstring and hasn't restarted. ***???restarted bike? Hasn't been using weights.  Wt Readings from Last 3 Encounters:  05/26/24 206 lb 9.6 oz (93.7 kg)  05/12/24 204 lb (92.5 kg)  03/30/24 203 lb 9.6 oz (92.4 kg)    H/o Vitamin D  deficiency (last low in 2016). Last level was 81.4 in 10/2023.  She reported in June that she had been taking 3000 IU (dose for 2 gummies) along with MVI daily.   She was advised in June to stop the 3000 IU gummy, just continue on MVI, and can restart 1 gummy (1500 IU) in the winter.   PMH, PSH, SH  reviewed   ROS: Denies fever, chills, headaches, dizziness, shortness of breath, chest pain. Denies nausea, vomiting, bowel changes, urinary complaints, bleeding, bruising, rash.  Congestion/cold symptoms started a few days ago, improving. Has some allergies also. Some residual PND and cough. No purulence    PHYSICAL EXAM:  There were no vitals taken for this visit.  Wt Readings from Last 3 Encounters:  05/26/24 206 lb 9.6 oz (93.7 kg)  05/12/24 204 lb (92.5 kg)  03/30/24 203 lb 9.6 oz (92.4 kg)    Pleasant, well-appearing female in no distress.  Rare throat-clearing during visit. Doesn't sound congested, no coughing. HEENT: conjuntiva and sclera are clear, EOMI.   Neck: no lymphadenopathy, thyromegaly or mass Heart: regular rate and rhythm Lungs: clear bilaterally Abdomen: soft, nontneder Extremities: no edema Skin: normal turgor, no visible rashes (limited exam) Psych: normal mood, affect, hygiene and grooming Neuro: alert and oriented, cranial nerves grossly intact, normal gait     ASSESSMENT/PLAN:   A1c  Did she ever get RSV? Tdap? Shingrix?  Can resume 1500 IU vitamin D3 gummy (just one) with time change (through end of March)  If weight or A1c up, rec mounjaro  again (pt has declined in the past).   F/u as planned with DM education next month.  F/u at [facility] in 11/2024.  F/u with Dr. Vita (and get knee x-rays) if ongoing pain.

## 2024-08-02 NOTE — Patient Instructions (Incomplete)
   I recommend getting RSV vaccine from the pharmacy. Please get this soon, as this is an illness that occurs during the winter (wait 2 weeks from your recent flu shot). It is recommended for everyone over 75, but you are also at higher risk (diabetes, your medications).  COVID booster is also recommended.  As discussed at your wellness visit, you are past due for a tetanus booster. Please get this from the pharmacy.   We had also discussed getting the shingles vaccine (Shingrix). Since you have Medicare, you will need to get this from the pharmacy, as it is covered by Part D. This is a series of 2 injections, spaced 2 months apart.   This should be separated from other vaccines by at least 2 weeks.

## 2024-08-03 ENCOUNTER — Encounter: Payer: Self-pay | Admitting: Family Medicine

## 2024-08-03 ENCOUNTER — Ambulatory Visit: Admitting: Family Medicine

## 2024-08-03 VITALS — BP 130/70 | HR 76 | Ht 62.0 in | Wt 206.8 lb

## 2024-08-03 DIAGNOSIS — E1169 Type 2 diabetes mellitus with other specified complication: Secondary | ICD-10-CM | POA: Diagnosis not present

## 2024-08-03 DIAGNOSIS — E559 Vitamin D deficiency, unspecified: Secondary | ICD-10-CM | POA: Diagnosis not present

## 2024-08-03 DIAGNOSIS — E1159 Type 2 diabetes mellitus with other circulatory complications: Secondary | ICD-10-CM | POA: Diagnosis not present

## 2024-08-03 DIAGNOSIS — I152 Hypertension secondary to endocrine disorders: Secondary | ICD-10-CM

## 2024-08-03 DIAGNOSIS — Z7185 Encounter for immunization safety counseling: Secondary | ICD-10-CM

## 2024-08-03 DIAGNOSIS — E66812 Obesity, class 2: Secondary | ICD-10-CM | POA: Diagnosis not present

## 2024-08-03 DIAGNOSIS — Z6837 Body mass index (BMI) 37.0-37.9, adult: Secondary | ICD-10-CM

## 2024-08-03 LAB — POCT GLYCOSYLATED HEMOGLOBIN (HGB A1C): Hemoglobin A1C: 6.9 % — AB (ref 4.0–5.6)

## 2024-08-03 MED ORDER — TIRZEPATIDE 2.5 MG/0.5ML ~~LOC~~ SOAJ: 2.5000 mg | SUBCUTANEOUS | 0 refills | Status: AC

## 2024-08-04 ENCOUNTER — Telehealth: Payer: Self-pay

## 2024-08-04 ENCOUNTER — Encounter: Payer: Self-pay | Admitting: Family Medicine

## 2024-08-04 ENCOUNTER — Other Ambulatory Visit (HOSPITAL_COMMUNITY): Payer: Self-pay

## 2024-08-04 NOTE — Telephone Encounter (Signed)
 Pharmacy Patient Advocate Encounter  Received notification from [facility] [employer]. that Prior Authorization for Mounjaro  2.5MG /0.5ML auto-injectors  has been APPROVED  to 10.9.26. Ran test claim, Copay is $270.57. This test claim was processed through [facility]- copay amounts may vary at other pharmacies due to pharmacy/plan contracts, or as the patient moves through the different stages of their insurance plan.   PA #/Case ID/Reference #: [id number]

## 2024-08-04 NOTE — Telephone Encounter (Signed)
 Pharmacy Patient Advocate Encounter   Received notification from Onbase that prior authorization for Mounjaro  2.5MG /0.5ML auto-injectors  is required/requested.   Insurance verification completed.   The patient is insured through [facility] [employer].   Per test claim: PA required; PA submitted to above mentioned insurance via Latent Key/confirmation #/EOC [id number] Status is pending

## 2024-08-05 DIAGNOSIS — L4050 Arthropathic psoriasis, unspecified: Secondary | ICD-10-CM | POA: Diagnosis not present

## 2024-08-11 ENCOUNTER — Other Ambulatory Visit (INDEPENDENT_AMBULATORY_CARE_PROVIDER_SITE_OTHER)

## 2024-08-11 DIAGNOSIS — E1169 Type 2 diabetes mellitus with other specified complication: Secondary | ICD-10-CM

## 2024-08-11 NOTE — Progress Notes (Signed)
   08/11/2024  Patient ID: April Matthews, female   DOB: January 15, 1946, 78 y.o.   MRN: [id number]  Contacted patient via telephone to discuss use of Mounjaro .  Patient has not picked up from pharmacy yet, she did not realize it had been approved through insurance.  Made patient aware that cost is $270.57 for a 28DS through insurance. Explained that a cost breakdown shows $94.15 is due to deductible and remaining $176.42 is coinsurance cost.  Explained that price could go down following completion of deductible but that with 2026 approaching, deductible will start over. Unable to project what cost would be.  Patient plans to discuss cost with her husband and will call back to notify if she wants to start medication. Declines to schedule f/u, will reach out to notify when ready. Requests mychart message with my contact info. Sending to patient.   Jon VEAR Lindau, PharmD Clinical Pharmacist [phone]

## 2024-08-22 ENCOUNTER — Other Ambulatory Visit: Payer: Self-pay | Admitting: Family Medicine

## 2024-08-22 DIAGNOSIS — I10 Essential (primary) hypertension: Secondary | ICD-10-CM

## 2024-08-22 DIAGNOSIS — E1169 Type 2 diabetes mellitus with other specified complication: Secondary | ICD-10-CM

## 2024-09-14 ENCOUNTER — Ambulatory Visit: Admitting: Skilled Nursing Facility1

## 2024-09-30 DIAGNOSIS — R5383 Other fatigue: Secondary | ICD-10-CM | POA: Diagnosis not present

## 2024-09-30 DIAGNOSIS — L4050 Arthropathic psoriasis, unspecified: Secondary | ICD-10-CM | POA: Diagnosis not present

## 2024-09-30 DIAGNOSIS — Z111 Encounter for screening for respiratory tuberculosis: Secondary | ICD-10-CM | POA: Diagnosis not present

## 2024-10-03 ENCOUNTER — Telehealth: Payer: Self-pay

## 2024-10-03 NOTE — Telephone Encounter (Signed)
 Error

## 2024-10-07 DIAGNOSIS — E669 Obesity, unspecified: Secondary | ICD-10-CM | POA: Diagnosis not present

## 2024-10-07 DIAGNOSIS — E559 Vitamin D deficiency, unspecified: Secondary | ICD-10-CM | POA: Diagnosis not present

## 2024-10-07 DIAGNOSIS — L401 Generalized pustular psoriasis: Secondary | ICD-10-CM | POA: Diagnosis not present

## 2024-10-07 DIAGNOSIS — Z6836 Body mass index (BMI) 36.0-36.9, adult: Secondary | ICD-10-CM | POA: Diagnosis not present

## 2024-10-07 DIAGNOSIS — L4050 Arthropathic psoriasis, unspecified: Secondary | ICD-10-CM | POA: Diagnosis not present

## 2024-10-07 DIAGNOSIS — M1991 Primary osteoarthritis, unspecified site: Secondary | ICD-10-CM | POA: Diagnosis not present

## 2024-10-07 DIAGNOSIS — R7989 Other specified abnormal findings of blood chemistry: Secondary | ICD-10-CM | POA: Diagnosis not present

## 2024-10-07 DIAGNOSIS — Z79899 Other long term (current) drug therapy: Secondary | ICD-10-CM | POA: Diagnosis not present

## 2024-10-07 DIAGNOSIS — M79605 Pain in left leg: Secondary | ICD-10-CM | POA: Diagnosis not present

## 2024-10-07 DIAGNOSIS — G5601 Carpal tunnel syndrome, right upper limb: Secondary | ICD-10-CM | POA: Diagnosis not present

## 2024-10-10 ENCOUNTER — Other Ambulatory Visit: Payer: Self-pay | Admitting: Family Medicine

## 2024-10-10 DIAGNOSIS — E7800 Pure hypercholesterolemia, unspecified: Secondary | ICD-10-CM

## 2024-10-12 ENCOUNTER — Other Ambulatory Visit: Payer: Self-pay | Admitting: Family Medicine

## 2024-10-12 DIAGNOSIS — E1159 Type 2 diabetes mellitus with other circulatory complications: Secondary | ICD-10-CM

## 2024-10-12 DIAGNOSIS — I10 Essential (primary) hypertension: Secondary | ICD-10-CM

## 2024-11-09 ENCOUNTER — Ambulatory Visit: Admitting: Family Medicine

## 2024-11-09 ENCOUNTER — Ambulatory Visit: Payer: Self-pay | Admitting: Family Medicine

## 2024-11-14 ENCOUNTER — Other Ambulatory Visit: Payer: Self-pay | Admitting: Family Medicine

## 2024-11-14 DIAGNOSIS — E1169 Type 2 diabetes mellitus with other specified complication: Secondary | ICD-10-CM

## 2024-11-14 DIAGNOSIS — I10 Essential (primary) hypertension: Secondary | ICD-10-CM

## 2024-11-24 ENCOUNTER — Ambulatory Visit: Admitting: Family Medicine

## 2024-12-08 ENCOUNTER — Ambulatory Visit: Admitting: Family Medicine

## 2025-01-11 ENCOUNTER — Ambulatory Visit: Payer: Self-pay | Admitting: Family Medicine
